# Patient Record
Sex: Female | Born: 1960 | Race: White
Health system: Southern US, Community
[De-identification: ages and names within clinical notes are randomized; demographics above are authoritative.]

## PROBLEM LIST (undated history)

## (undated) DIAGNOSIS — J45909 Unspecified asthma, uncomplicated: Secondary | ICD-10-CM

## (undated) DIAGNOSIS — F419 Anxiety disorder, unspecified: Secondary | ICD-10-CM

## (undated) DIAGNOSIS — R51 Headache: Secondary | ICD-10-CM

## (undated) DIAGNOSIS — E7212 Methylenetetrahydrofolate reductase deficiency: Secondary | ICD-10-CM

## (undated) DIAGNOSIS — E7211 Homocystinuria: Secondary | ICD-10-CM

## (undated) DIAGNOSIS — K562 Volvulus: Secondary | ICD-10-CM

## (undated) DIAGNOSIS — F32A Depression, unspecified: Secondary | ICD-10-CM

## (undated) DIAGNOSIS — G4730 Sleep apnea, unspecified: Secondary | ICD-10-CM

## (undated) DIAGNOSIS — Z411 Encounter for cosmetic surgery: Secondary | ICD-10-CM

## (undated) DIAGNOSIS — M5432 Sciatica, left side: Secondary | ICD-10-CM

## (undated) DIAGNOSIS — K219 Gastro-esophageal reflux disease without esophagitis: Secondary | ICD-10-CM

## (undated) DIAGNOSIS — F17210 Nicotine dependence, cigarettes, uncomplicated: Secondary | ICD-10-CM

## (undated) DIAGNOSIS — M545 Low back pain, unspecified: Secondary | ICD-10-CM

## (undated) DIAGNOSIS — F40240 Claustrophobia: Secondary | ICD-10-CM

## (undated) DIAGNOSIS — M1990 Unspecified osteoarthritis, unspecified site: Secondary | ICD-10-CM

## (undated) DIAGNOSIS — J302 Other seasonal allergic rhinitis: Secondary | ICD-10-CM

## (undated) DIAGNOSIS — K589 Irritable bowel syndrome without diarrhea: Secondary | ICD-10-CM

## (undated) DIAGNOSIS — Z973 Presence of spectacles and contact lenses: Secondary | ICD-10-CM

## (undated) DIAGNOSIS — J069 Acute upper respiratory infection, unspecified: Secondary | ICD-10-CM

## (undated) DIAGNOSIS — K635 Polyp of colon: Secondary | ICD-10-CM

## (undated) DIAGNOSIS — M539 Dorsopathy, unspecified: Secondary | ICD-10-CM

## (undated) DIAGNOSIS — T8859XA Other complications of anesthesia, initial encounter: Secondary | ICD-10-CM

## (undated) DIAGNOSIS — H9319 Tinnitus, unspecified ear: Secondary | ICD-10-CM

## (undated) DIAGNOSIS — F329 Major depressive disorder, single episode, unspecified: Secondary | ICD-10-CM

## (undated) DIAGNOSIS — T4145XA Adverse effect of unspecified anesthetic, initial encounter: Secondary | ICD-10-CM

## (undated) DIAGNOSIS — R6889 Other general symptoms and signs: Secondary | ICD-10-CM

## (undated) HISTORY — DX: Nicotine dependence, cigarettes, uncomplicated: F17.210

## (undated) HISTORY — DX: Sleep apnea, unspecified: G47.30

## (undated) HISTORY — DX: Encounter for cosmetic surgery: Z41.1

## (undated) HISTORY — PX: HX ABDOMINOPLASTY: SHX6

## (undated) HISTORY — PX: HX EYE SURGERY: 2100001143

## (undated) HISTORY — PX: HX SEPTOPLASTY: SHX37

## (undated) HISTORY — PX: HX LAPAROTOMY: SHX154

## (undated) HISTORY — PX: COSMETIC SURGERY: SHX468

## (undated) HISTORY — PX: NOVASURE ABLATION: SHX5394

## (undated) HISTORY — PX: BLADDER SURGERY: SHX569

## (undated) HISTORY — PX: SEPTOPLASTY: SUR1290

---
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
## 2007-10-11 HISTORY — PX: LIPOSUCTION, NECK (COSMETIC): SHX4751

## 2007-10-11 HISTORY — PX: ABDOMINOPLASTY, LIPOSUCTION (COSMETIC): SHX3010

## 2010-03-14 ENCOUNTER — Emergency Department
Admission: EM | Admit: 2010-03-14 | Disposition: A | Discharge status: Home / Self Care | Payer: Self-pay | Source: Emergency Department | Admitting: Emergency Medicine

## 2011-11-04 ENCOUNTER — Emergency Department
Admission: EM | Admit: 2011-11-04 | Disposition: A | Discharge status: Home / Self Care | Payer: Self-pay | Source: Emergency Department | Admitting: Emergency Medicine

## 2013-10-10 DIAGNOSIS — A0472 Enterocolitis due to Clostridium difficile, not specified as recurrent: Secondary | ICD-10-CM

## 2013-10-10 HISTORY — PX: HYSTEROSCOPY, ENDOMETRIAL ABLATION, THERMACHOICE: SHX4233

## 2013-10-10 HISTORY — PX: EGD, COLONOSCOPY: SHX3799

## 2013-10-10 HISTORY — PX: CYSTOSCOPY, URETEROSCOPY: SHX3632

## 2013-10-10 HISTORY — DX: Enterocolitis due to Clostridium difficile, not specified as recurrent: A04.72

## 2013-10-29 ENCOUNTER — Other Ambulatory Visit
Admission: RE | Admit: 2013-10-29 | Discharge: 2013-10-29 | Disposition: A | Discharge status: Home / Self Care | Source: Ambulatory Visit | Attending: Gastroenterology | Admitting: Gastroenterology

## 2013-10-29 ENCOUNTER — Ambulatory Visit: Payer: Self-pay

## 2013-10-29 DIAGNOSIS — K209 Esophagitis, unspecified without bleeding: Secondary | ICD-10-CM

## 2013-10-29 DIAGNOSIS — K319 Disease of stomach and duodenum, unspecified: Secondary | ICD-10-CM

## 2013-10-29 DIAGNOSIS — K219 Gastro-esophageal reflux disease without esophagitis: Secondary | ICD-10-CM | POA: Insufficient documentation

## 2013-10-29 DIAGNOSIS — K2960 Other gastritis without bleeding: Secondary | ICD-10-CM | POA: Insufficient documentation

## 2013-10-29 DIAGNOSIS — R109 Unspecified abdominal pain: Secondary | ICD-10-CM | POA: Insufficient documentation

## 2013-10-29 DIAGNOSIS — K2289 Other specified disease of esophagus: Secondary | ICD-10-CM | POA: Insufficient documentation

## 2013-10-29 HISTORY — PX: HX UPPER ENDOSCOPY: 2100001144

## 2013-10-30 ENCOUNTER — Ambulatory Visit (HOSPITAL_BASED_OUTPATIENT_CLINIC_OR_DEPARTMENT_OTHER)
Admission: RE | Admit: 2013-10-30 | Discharge: 2013-10-30 | Disposition: A | Discharge status: Home / Self Care | Source: Ambulatory Visit | Attending: Gynecology | Admitting: Gynecology

## 2013-10-30 ENCOUNTER — Encounter (HOSPITAL_BASED_OUTPATIENT_CLINIC_OR_DEPARTMENT_OTHER): Payer: Self-pay

## 2013-10-30 ENCOUNTER — Other Ambulatory Visit (HOSPITAL_BASED_OUTPATIENT_CLINIC_OR_DEPARTMENT_OTHER): Payer: Self-pay | Admitting: Gynecology

## 2013-10-30 DIAGNOSIS — N924 Excessive bleeding in the premenopausal period: Secondary | ICD-10-CM | POA: Insufficient documentation

## 2013-10-30 DIAGNOSIS — Z01812 Encounter for preprocedural laboratory examination: Secondary | ICD-10-CM | POA: Insufficient documentation

## 2013-10-30 DIAGNOSIS — N393 Stress incontinence (female) (male): Secondary | ICD-10-CM | POA: Insufficient documentation

## 2013-10-30 DIAGNOSIS — Z01810 Encounter for preprocedural cardiovascular examination: Secondary | ICD-10-CM | POA: Insufficient documentation

## 2013-10-30 HISTORY — DX: Other complications of anesthesia, initial encounter: T88.59XA

## 2013-10-30 HISTORY — DX: Gastro-esophageal reflux disease without esophagitis: K21.9

## 2013-10-30 HISTORY — DX: Anxiety disorder, unspecified: F41.9

## 2013-10-30 HISTORY — DX: Presence of spectacles and contact lenses: Z97.3

## 2013-10-30 HISTORY — DX: Dorsopathy, unspecified: M53.9

## 2013-10-30 HISTORY — DX: Unspecified osteoarthritis, unspecified site: M19.90

## 2013-10-30 HISTORY — DX: Unspecified asthma, uncomplicated: J45.909

## 2013-10-30 HISTORY — DX: Sleep apnea, unspecified: G47.30

## 2013-10-30 HISTORY — DX: Depression, unspecified: F32.A

## 2013-10-30 LAB — LAB USE ONLY - HISTORICAL SURGICAL PATHOLOGY

## 2013-10-30 LAB — URINALYSIS WITH CULTURE REFLEX IF INDICATED BMC/JMC ONLY
BILIRUBIN,URINE: NEGATIVE
BLOOD,URINE: NEGATIVE
GLUCOSE, URINE: NEGATIVE mg/dL
KETONES,URINE: NEGATIVE mg/dL
LEUKOCYTE ESTERASE,URINE: NEGATIVE
NITRITES,URINE: NEGATIVE
PH,URINE: 6.5 (ref 5.0–7.5)
PROTEIN, URINE, RANDOM: NEGATIVE mg/dL
SPECIFIC GRAVITY,URINE: <=1.005 (ref 1.005–1.020)
UROBILINOGEN, URINE: 0.2 mg/dL (ref 0.2–1.0)

## 2013-10-30 LAB — COMPREHENSIVE METABOLIC PROFILE - BMC/JMC ONLY
ALBUMIN: 3.6 g/dL (ref 3.2–5.0)
ALKALINE PHOSPHATASE: 55 IU/L (ref 35–120)
ALT (SGPT): 19 IU/L (ref 0–55)
AST (SGOT): 21 IU/L (ref 0–45)
BILIRUBIN, TOTAL: 0.6 mg/dL (ref 0.0–1.3)
BUN: 7 mg/dL (ref 6–22)
CALCIUM: 9.2 mg/dL (ref 8.5–10.5)
CARBON DIOXIDE: 31 mmol/L (ref 22–32)
CHLORIDE: 104 mmol/L (ref 101–111)
CHLORIDE: 104 mmol/L (ref 101–111)
CREATININE: 0.72 mg/dL (ref 0.53–1.00)
CREATININE: 0.72 mg/dL (ref 0.53–1.00)
ESTIMATED GLOMERULAR FILTRATION RATE: >60 mL/min (ref >60)
GLUCOSE: 76 mg/dL (ref 70–110)
POTASSIUM: 4.3 mmol/L (ref 3.5–5.0)
SODIUM: 137 mmol/L (ref 136–145)
TOTAL PROTEIN: 6.2 g/dL (ref 6.0–8.0)

## 2013-10-30 LAB — CBC
BASOPHIL #: 0.07 K/uL (ref 0.00–0.10)
BASOPHILS %: 0.8 % (ref 0.0–1.4)
EOSINOPHIL %: 2.6 % (ref 0.0–5.2)
HCT: 38.9 % (ref 36.0–45.0)
HGB: 13.6 g/dL (ref 12.0–15.5)
LYMPHOCYTE #: 2.14 K/uL (ref 0.70–3.20)
LYMPHOCYTE %: 24.6 % (ref 15.0–43.0)
MCH: 33.5 pg (ref 28.0–34.0)
MCHC: 34.8 g/dL (ref 33.0–37.0)
MCV: 96.3 fL (ref 82.0–97.0)
MONOCYTE #: 0.56 10*3/uL (ref 0.20–0.90)
MONOCYTE %: 6.4 % (ref 4.8–12.0)
MPV: 6.9 fL — ABNORMAL LOW (ref 7.0–9.4)
PLATELET COUNT: 281 K/uL (ref 150–400)
PMN #: 5.71 K/uL (ref 1.50–6.50)
PMN %: 65.6 % (ref 43.0–76.0)
RBC: 4.04 M/uL (ref 4.00–5.10)
RDW: 12.4 % (ref 11.0–13.0)
WBC: 8.7 K/uL (ref 4.0–11.0)

## 2013-10-30 LAB — MRSA BY PCR (NARES ONLY) - BMC/JMC ONLY: STAPH AUREUS METHICILLIN RESISTANT DNA: NEGATIVE

## 2013-10-30 LAB — TYPE AND SCREEN - BMC/JMC ONLY
ABO/RH(D): O POS
ANTIBODY SCREEN: NEGATIVE

## 2013-11-18 MED ORDER — CEFAZOLIN 1 GRAM/50 ML IN DEXTROSE (ISO-OSMOTIC) INTRAVENOUS DUPLEX
1.00 g | INJECTION | Freq: Once | INTRAVENOUS | Status: DC
Start: 2013-11-18 — End: 2013-11-18
  Administered 2013-11-19: 1 g via INTRAVENOUS
  Filled 2013-11-18: qty 50

## 2013-11-18 MED ORDER — CEFAZOLIN 1 GRAM/50 ML IN DEXTROSE (ISO-OSMOTIC) INTRAVENOUS PIGGYBACK
1.00 g | INJECTION | Freq: Once | INTRAVENOUS | Status: DC
Start: 2013-11-19 — End: 2013-11-19
  Filled 2013-11-18: qty 50

## 2013-11-19 ENCOUNTER — Other Ambulatory Visit (HOSPITAL_COMMUNITY): Payer: Self-pay | Admitting: Gynecology

## 2013-11-19 ENCOUNTER — Inpatient Hospital Stay (HOSPITAL_BASED_OUTPATIENT_CLINIC_OR_DEPARTMENT_OTHER)
Admission: RE | Admit: 2013-11-19 | Discharge: 2013-11-19 | Disposition: A | Discharge status: Home / Self Care | Source: Ambulatory Visit | Attending: Gynecology | Admitting: Gynecology

## 2013-11-19 ENCOUNTER — Encounter (HOSPITAL_BASED_OUTPATIENT_CLINIC_OR_DEPARTMENT_OTHER)
Admission: RE | Disposition: A | Discharge status: Home / Self Care | Payer: Self-pay | Source: Ambulatory Visit | Attending: Gynecology

## 2013-11-19 ENCOUNTER — Encounter (HOSPITAL_BASED_OUTPATIENT_CLINIC_OR_DEPARTMENT_OTHER): Payer: Self-pay

## 2013-11-19 DIAGNOSIS — F3289 Other specified depressive episodes: Secondary | ICD-10-CM | POA: Insufficient documentation

## 2013-11-19 DIAGNOSIS — F411 Generalized anxiety disorder: Secondary | ICD-10-CM | POA: Insufficient documentation

## 2013-11-19 DIAGNOSIS — M19049 Primary osteoarthritis, unspecified hand: Secondary | ICD-10-CM | POA: Insufficient documentation

## 2013-11-19 DIAGNOSIS — F17200 Nicotine dependence, unspecified, uncomplicated: Secondary | ICD-10-CM | POA: Insufficient documentation

## 2013-11-19 DIAGNOSIS — N859 Noninflammatory disorder of uterus, unspecified: Secondary | ICD-10-CM

## 2013-11-19 DIAGNOSIS — F329 Major depressive disorder, single episode, unspecified: Secondary | ICD-10-CM | POA: Insufficient documentation

## 2013-11-19 DIAGNOSIS — K219 Gastro-esophageal reflux disease without esophagitis: Secondary | ICD-10-CM | POA: Insufficient documentation

## 2013-11-19 DIAGNOSIS — N841 Polyp of cervix uteri: Secondary | ICD-10-CM

## 2013-11-19 DIAGNOSIS — N924 Excessive bleeding in the premenopausal period: Secondary | ICD-10-CM | POA: Insufficient documentation

## 2013-11-19 DIAGNOSIS — N3641 Hypermobility of urethra: Secondary | ICD-10-CM | POA: Insufficient documentation

## 2013-11-19 DIAGNOSIS — K449 Diaphragmatic hernia without obstruction or gangrene: Secondary | ICD-10-CM | POA: Insufficient documentation

## 2013-11-19 DIAGNOSIS — N393 Stress incontinence (female) (male): Secondary | ICD-10-CM | POA: Insufficient documentation

## 2013-11-19 DIAGNOSIS — D260 Other benign neoplasm of cervix uteri: Secondary | ICD-10-CM | POA: Insufficient documentation

## 2013-11-19 DIAGNOSIS — M5430 Sciatica, unspecified side: Secondary | ICD-10-CM | POA: Insufficient documentation

## 2013-11-19 DIAGNOSIS — G4730 Sleep apnea, unspecified: Secondary | ICD-10-CM | POA: Insufficient documentation

## 2013-11-19 DIAGNOSIS — M47812 Spondylosis without myelopathy or radiculopathy, cervical region: Secondary | ICD-10-CM | POA: Insufficient documentation

## 2013-11-19 HISTORY — PX: HYSTEROSCOPY W/ ENDOMETRIAL ABLATION: SUR665

## 2013-11-19 HISTORY — PX: INCONTINENCE SURGERY: SHX676

## 2013-11-19 HISTORY — PX: CYSTOSCOPY: SUR368

## 2013-11-19 HISTORY — PX: DILATION AND CURETTAGE, DIAGNOSTIC / THERAPEUTIC: SUR384

## 2013-11-19 LAB — HCG, URINE QUALITATIVE, PREGNANCY: PREGNANCY, URINE: NEGATIVE m[IU]/mL

## 2013-11-19 SURGERY — VAGINAL SLING
Anesthesia: General | Wound class: Clean Contaminated Wounds-The respiratory, GI, Genital, or urinary

## 2013-11-19 MED ORDER — HYDROMORPHONE 1 MG/ML INJECTION WRAPPER
0.50 mg | INJECTION | INTRAMUSCULAR | Status: DC | PRN
Start: 2013-11-19 — End: 2013-11-19

## 2013-11-19 MED ORDER — IPRATROPIUM BROMIDE 0.02 % SOLUTION FOR INHALATION
0.50 mg | Freq: Once | RESPIRATORY_TRACT | Status: DC | PRN
Start: 2013-11-19 — End: 2013-11-19

## 2013-11-19 MED ORDER — BUPIVACAINE-EPINEPHRINE (PF) 0.25 %-1:200,000 INJECTION SOLUTION
INTRAMUSCULAR | Status: AC
Start: 2013-11-19 — End: 2013-11-19
  Filled 2013-11-19: qty 1

## 2013-11-19 MED ORDER — SULFAMETHOXAZOLE 800 MG-TRIMETHOPRIM 160 MG TABLET
1.00 | ORAL_TABLET | Freq: Two times a day (BID) | ORAL | Status: DC
Start: 2013-11-19 — End: 2018-08-28

## 2013-11-19 MED ORDER — SODIUM CHLORIDE 0.9 % IRRIGATION SOLUTION
Freq: Once | Status: AC
Start: 2013-11-19 — End: 2013-11-19

## 2013-11-19 MED ORDER — ALBUTEROL SULFATE CONCENTRATE 2.5 MG/0.5 ML SOLUTION FOR NEBULIZATION
5.00 mg | INHALATION_SOLUTION | Freq: Once | RESPIRATORY_TRACT | Status: DC | PRN
Start: 2013-11-19 — End: 2013-11-19

## 2013-11-19 MED ORDER — HYDROCODONE 5 MG-ACETAMINOPHEN 325 MG TABLET
1.00 | ORAL_TABLET | Freq: Once | ORAL | Status: AC | PRN
Start: 2013-11-19 — End: 2013-11-19
  Administered 2013-11-19: 1 via ORAL
  Filled 2013-11-19: qty 1

## 2013-11-19 MED ORDER — CEFAZOLIN 1 GRAM SOLUTION FOR INJECTION
INTRAMUSCULAR | Status: AC
Start: 2013-11-19 — End: 2013-11-19
  Filled 2013-11-19: qty 10

## 2013-11-19 MED ORDER — BUPIVACAINE-EPINEPHRINE (PF) 0.25 %-1:200,000 INJECTION SOLUTION
10.00 mL | Freq: Once | INTRAMUSCULAR | Status: AC
Start: 2013-11-19 — End: 2013-11-19
  Administered 2013-11-19: 10 mL via INTRAMUSCULAR

## 2013-11-19 MED ORDER — LACTATED RINGERS INTRAVENOUS SOLUTION
INTRAVENOUS | Status: DC
Start: 2013-11-19 — End: 2013-11-19

## 2013-11-19 MED ORDER — PROMETHAZINE 12.5MG IN NS 50ML PREMIX
12.50 mg | INJECTION | Freq: Once | INTRAVENOUS | Status: DC | PRN
Start: 2013-11-19 — End: 2013-11-19

## 2013-11-19 MED ORDER — KETOROLAC 30 MG/ML (1 ML) INJECTION SOLUTION
30.00 mg | Freq: Once | INTRAMUSCULAR | Status: DC | PRN
Start: 2013-11-19 — End: 2013-11-19

## 2013-11-19 MED ORDER — DEXAMETHASONE SODIUM PHOSPHATE 10 MG/ML INJECTION SOLUTION
INTRAMUSCULAR | Status: AC
Start: 2013-11-19 — End: 2013-11-19
  Filled 2013-11-19: qty 1

## 2013-11-19 MED ORDER — ACETAMINOPHEN 1,000 MG/100 ML (10 MG/ML) INTRAVENOUS SOLUTION
INTRAVENOUS | Status: AC
Start: 2013-11-19 — End: 2013-11-19
  Filled 2013-11-19: qty 100

## 2013-11-19 MED ORDER — HYDROCODONE 5 MG-ACETAMINOPHEN 325 MG TABLET
1.00 | ORAL_TABLET | ORAL | Status: DC | PRN
Start: 2013-11-19 — End: 2018-08-28

## 2013-11-19 MED ADMIN — Medication: @ 14:00:00

## 2013-11-19 MED ADMIN — lactated Ringers intravenous solution: INTRAVENOUS | @ 13:00:00

## 2013-11-19 SURGICAL SUPPLY — 38 items
CATH IU THRMCH BAL ABLT UT EXC ESSIVE MENSTRUAL BLD (BALLOON) IMPLANT
CATH URETH 16FR FOLEY 2W BAL UNCT SIL 5CC STRL LF  DISP (UROLOGICAL SUPPLIES) ×2 IMPLANT
CATH URETH BARD 16FR FL 2W BAL_UNCT SIL 5CC STRL LF DISP (UROLOGICAL SUPPLIES) ×1
CATH URETH DOVER RBNL 16FR 16IN 2 STGR DRAIN EYE RND CLS TIP INT FNL CONN PVC STRL LF  DISP (UROLOGICAL SUPPLIES) ×2 IMPLANT
CATH URETH DV RBNL 16FR 16IN 2_STGR DRAIN EYE RND CLS TIP (UROLOGICAL SUPPLIES) ×1
CONV USE ITEM 338068 - KIT SURG SM STUP NONST DISP LF (CUSTOM TRAYS & PACK) ×2 IMPLANT
DRAPE PCH PORT 26X18IN UNDR BUTT SURG CLR (PROTECTIVE PRODUCTS/GARMENTS) ×2
DRAPE PCH PORT 26X18IN UNDR BU_TT SURG CLR (PROTECTIVE PRODUCTS/GARMENTS) ×1
DRAPE PCH PORT 39X46IN UNDR BUTT SURG CLR (PROTECTIVE PRODUCTS/GARMENTS) ×2 IMPLANT
DRESSING TELFA PAD 3X4IN ST 2132 100EA/BX (WOUND CARE SUPPLY) ×2 IMPLANT
DRESSING TELFA PAD 3X4IN ST 2132 100EA/BX (WOUND CARE/ENTEROSTOMAL SUPPLY) ×1
GLOVE SURG 7 LTX 3 PLN MLD PWD R BEAD CUF ANSLP STRL BRN 12IN (GLOVES AND ACCESSORIES) ×3 IMPLANT
GOWN SURG XL AAMI L4 IMPRV REI NF BRTHBL STRL LF DISP CNVRT (PROTECTIVE PRODUCTS/GARMENTS) ×3 IMPLANT
KIT SURG SM STUP NONST DISP LF (CUSTOM TRAYS & PACK) ×2
KIT UTER SURESOUND ENDMTRL ABLATION (BFP) ×2 IMPLANT
KIT UTER SURESOUND ENDMTRL ABL_ATION (BFP) ×2
NEEDLE HYPO  25GA 1.5IN MONOJECT MAGELLAN SS BVL ORT SLF LEVEL SHEATH SFSHLD STD LL SYRG ORNG STRL (Syringes w/ o Needles) ×2 IMPLANT
NEEDLE HYPO 25GA 1.5IN MONOJE CT MGLN SS BVL ORT SLF LEVEL (Syringes w/ o Needles) ×1
PACK GYN/PERI 9165 9165 (DRAPE/PACKS/SHEETS/OR TOWEL) ×3 IMPLANT
PACK SM SET UP_DYNJ906635 (CUSTOM TRAYS & PACK) ×1
PAD RELEASE 3 X 4 IN_1050 50/BX (WOUND CARE SUPPLY) ×2 IMPLANT
PAD RELEASE 3 X 4 IN_1050 50/BX (WOUND CARE/ENTEROSTOMAL SUPPLY) ×1
SET 81IN REG CLAMP N-PYRG IRRG 10 GTT/ML STR CSCP DEHP BLADDER STRL LF (IV TUBING & ACCESSORIES) ×2 IMPLANT
SET 81IN REG CLAMP N-PYRG IRRG_10 GTT/ML STR CSCP DEHP (IV TUBING & ACCESSORIES) ×1
SOL IRRG 0.9% NACL 500ML PLASTIC PR BTL ISTNC N-PYRG STRL LF (SOLUTIONS) ×2 IMPLANT
SOL IV 0.9% NACL PVC 1000ML PH 5 PLASTIC CONTAINR PRSV FR VIAFLEX LF (SOLUTIONS) ×2 IMPLANT
SOLUTION IRRG NS 500CC 2F7123_18/CS (SOLUTIONS) ×2
SOLUTION IV NS 1000CC 2B1324X_14/CS (SOLUTIONS) ×1
SUTURE 3-0 SH VICRYL 27IN VIOL BRD COAT ABS (SUTURE/WOUND CLOSURE) ×2 IMPLANT
SUTURE 3-0 SH VICRYL 27IN VIOL_BRD COAT ABS (SUTURE/WOUND CLOSURE) ×1
SYRINGE HYPO 10CC LL 309604 100/BX (Syringes w/ o Needles) ×3 IMPLANT
TIP SUCT MEDIVAC YANKAUER TAPER BLBS CLR STRL LF  DISP (Suction) ×2 IMPLANT
TRAY SKIN SCRUB 8IN VNYL COTTON 6 WNG 6 SPONGE STICK 2 TIP APPL DRY STRL LF (KITS & TRAYS (DISPOSABLE)) ×2 IMPLANT
TRAY SURG PREP SCR CR ESTM (KITS & TRAYS (DISPOSABLE)) ×2
TUBING FLUID SAFE 10/BX_0502200000A (Suction) ×1
TUBING IRRG INTGR FLUID MGMT DISP (Suction) ×2 IMPLANT
URETH SUP SOLYX ADV 9CM 1 INCS SLING 1 DEL DEV 1 MESH POLYPROP SYSTEM MIDURETHRAL ×2 IMPLANT
YANKAUER SUCT TIP K80 CS/50 (Suction) ×1

## 2013-11-19 NOTE — Nurses Notes (Signed)
Patient discharged home with family.  AVS reviewed with patient/care giver.  A written copy of the AVS and discharge instructions was given to the patient/care giver.  Questions sufficiently answered as needed.  Patient/care giver encouraged to follow up with PCP as indicated.  In the event of an emergency, patient/care giver instructed to call 911 or go to the nearest emergency room.

## 2013-11-19 NOTE — OR Surgeon (Signed)
[facility]  [place], [place] [address]                                  OPERATIVE NOTE    Patient Name: [facility] Number: [id number]  Date of Service: 11/19/2013   Date of Birth: 01/12/1961      Pre-Operative Diagnosis: STRESS INCONTINENCE, PREMENOPAUSAL MENORRHAGIA, URETHRAL HYPERMOBILITY    Post-Operative Diagnosis: STRESS INCONTINENCE, PREMENOPAUSAL MENORRHAGIA, URETHRAL HYPERMOBILITY, CERVICAL POLYP    Procedure:    1. Diagnostic Hysteroscopy  2. Dilatation and curettage  3. Novasure Endometrial Ablation  4. MiniArc sling  5. Cystoscopy    Complications:  None     Surgeon: Estrella Myrtle, MD    Anesthesia: General    Estimated Blood Loss:  50    Specimen: None    Findings:   Uterine cavity length: 6.0 cm  Uterine cavity width 3.6 cm  Power: 119 watts  Time of therapy: 70 seconds  EUA revealed approx. 1X 2 cm cervical polyp protruding from os (removed with polyp forceps).  Hysteroscopic findings revealed a normal cavity. Post-ablation hysteroscopy revealed no sign of perforation and good global ablative changes. Cystoscopic findings after sling revealed neither damage to nor mesh within the bladder or urethra.               Condition: STABLE    Disposition: PACU          Time out performed prior the start of this procedure.    DESCRIPTION OF PROCEDURE: The patient was taken to the operating room, given general anesthesia, prepped and draped in the usual sterile fashion, placed in the dorsal lithotomy position with legs in adjustable stirrups. The bladder was drained with a catheter. A speculum was placed in the vagina. The anterior lip of the cervix was grasped with a single tooth tenaculum, retracted downward. The uterus was then sounded. The cervix was gently progressively dilated to 16 [place] using smooth metal dilators. The diagnostic hysteroscope was introduced with crystalloid as medium. The cavity was assessed with findings as noted above. The NovaSure  device was adjusted as appropriate. It was advanced to the fundus, the array extended and the maximum uterine width was obtained using the usual maneuvers. The cavity assessment test was performed and successfully completed on one attempt without incident. The therapy cycle was then begun and progressied normally through its automatic course without incident. The array was again retracted and the device removed. Hysteroscopy was repeated with findings as noted above. The tenaculum was removed. A small amount of bleeding was noted from the right tenaculum site.  This area was packed with 2 ray-tecs which were left in place during the sling portion of the procedure.      The MiniArc sling procedure was then performed as follows:  A Foley catheter was in place. The vaginal epithelium in the midline beneath the mid urethral region was grasped in vertical fashion using two Allis clamps, infiltrated using quarter percent Marcaine with epinephrine and incised using a scalpel. The vaginal epithelium was then dissected free bilaterally using the Metzenbaum scissors and the operator's finger for gentle dissection until the inferior pubic ramus and inner surface of the obturator membrane could be palpated on either side. The MiniArc sling was then attached to its needle trocar passer and the left anchoring point was advanced into the vaginal incision being guided by the operator's finger to a point  where it was used to pierce the musculofascial tissue along the inner surface of the left obturator membrane at its upper inner aspect at a 45-degree angle. It was advanced until the blue midline of the mesh tape could be seen at the midline beneath the urethra. The same procedure was then performed on the opposite side. The sling was then inspected and palpated and noted to be in good position beneath the mid urethra snug against the urethra without tension. The vaginal epithelium was closed using a running 3-0 Vicryl suture.  Antibiotic irrigation was used copiously throughout the case. The Foley catheter was removed. The cystoscopy was then performed with findings as noted above. The ray tec packings were removed and good hemostasis was noted.    The patient tolerated the procedure well and was taken to the recovery room awake and in stable condition, sponge/lap/needle counts correct X 2.     Zandra Abts, Md

## 2013-11-19 NOTE — Consults (Signed)
[facility]  [place], [place]  [address]    ANESTHESIA PRE-OP EVALUATION    MRN:  [id number]  Julie Browning  53 y.o.  Sex: female     Date of Service: 11/19/2013    Surgeon: Juliann Mule):  Estrella Myrtle, MD    Scheduled Procedure:  Procedure(s):  VAGINAL SLING Merna  HYSTEROSCOPY WITH NOVASURE  HYSTEROSCOPY WITH DILATION AND CURETTAGE  CYSTOSCOPY    Diagnosis/Pertinant HPI: STRESS INCONTINENCE, PREMENOPAUSAL MENORRHAGIA, URETHRAL HYPERMOBILITY     Weight: 78.019 kg (172 lb)  Height: 167.6 cm (5' 5.98")  Pain Score (Numeric, Faces): 4  Filed Vitals:    11/19/13 1229   BP: 113/70   Pulse: 79   Temp: 36.7 C (98.1 F)   Resp: 14   SpO2: 100%            ALLERGY:  No Known Allergies    Medications Prior to Admission    Outpatient Medications    Bifidobacterium Infantis (ALIGN) 4 mg Oral Capsule    Take by mouth    clonazePAM (KLONOPIN) 0.5 mg Oral Tablet    Take 0.5 mg by mouth Once per day as needed for Other (anxiety)    DULoxetine (CYMBALTA DR) 60 mg Oral Capsule, Delayed Release(E.C.)    Take 90 mg by mouth Once a day    esomeprazole magnesium (NEXIUM) 40 mg Oral Capsule, Delayed Release(E.C.)    Take 40 mg by mouth Every morning before breakfast    metroNIDAZOLE (FLAGYL) 500 mg Oral Tablet    Take 500 mg by mouth Three times a day      No meds today    Current Facility-Administered Medications:  LR premix infusion  Intravenous Continuous       Past Medical History   Diagnosis Date    Hiatal hernia     GERD (gastroesophageal reflux disease)     Anesthesia complication      hypotension with epidural; crying s/p abdominoplasty    Asthma      teenager; most recent occurance age 81    Sleep apnea      uses BiPAP occasionally    Wears glasses     Depression     Anxiety     Back problem      sciatica    Arthritis      neck and hands    C. difficile diarrhea 10/2013     taking flagyl to treat    Positive PPD      tests positive, but negative chest xrays   Able to lay flat  NPO    Past Surgical History   Procedure Laterality Date    Hx eye surgery Left      s/p injury    Hx laparotomy       ectopic pregnancy; removal of fallopian tube    Hx septoplasty      Hx abdominoplasty       also a necklift    Hx upper endoscopy  10/29/13     x 3       Pregnant: no    Prior Anesthesia Difficulties:  no    Familial Anesthesia Difficulties: no    Social History     Occupational History    Not on file.     Social History Main Topics    Smoking status: Current Every Day Smoker -- 0.75 packs/day     Types: Cigarettes    Smokeless tobacco: Never Used      Comment: using  vapor cigarette as well    Alcohol Use: Yes      Comment: occassional    Drug Use: No    Sexual Activity: Not on file     Other Topics Concern    Ability To Walk 2 Flight Of Steps Without Sob/Cp Yes    Ability To Do Own Adl's Yes       Labs:   BMP:    Lab Results   Component Value Date    SODIUM 137 10/30/2013    POTASSIUM 4.3 10/30/2013    CHLORIDE 104 10/30/2013    CO2 31 10/30/2013    BUN 7 10/30/2013    CREATININE 0.72 10/30/2013    GLUCOSECJ 76 10/30/2013    GFR >60 10/30/2013    CALCIUM 9.2 10/30/2013     CBC:    Lab Results   Component Value Date    WBC 8.7 10/30/2013    HGB 13.6 10/30/2013    HCT 38.9 10/30/2013    PLTCNT 281 10/30/2013      Platelets:    Lab Results   Component Value Date    PLTCNT 281 10/30/2013     Coags:    No results found for this basename: prothromtme, inr, inrnorm, aptt, ddimerquant, fibrinogen     TSH:   No results found for this basename: TSHCAS, TSH3RDGEN, FT3, freet4     Pregnancy test: Lab Results   Component Value Date    PREGNANCURED NEGATIVE 11/19/2013      Fingerstick glucose:    No results found for this basename: POCGLU, GLUPOCT             EKG: NSR  CXR:  N/A      ANESTHESIA DAY OF SURGERY EVALUATION      NPO: After midnight  Airway: II (soft palate, uvula, fauces visible)  Dentition:  Upper and lower  Normal    Cardiovascular: regular rate and rhythm   Respiratory: clear to auscultation  bilaterally       ASA Status: 2  Proposed Anesthesia: GETA rarely uses BiPAP       Pre-Induction Evaluation and informed consent including risks, benefits, and alternatives. Patient was seen and evaluated immediately prior to the induction of anesthesia.

## 2013-11-19 NOTE — H&P (Signed)
[facility]                                  [place], [place] [address]                                     [phone]                     SURGICAL HISTORY AND PHYSICAL    PATIENT NAME: Julie Browning, Julie Browning:Julie Browning  DATE OF SERVICE:11/19/2013  DATE OF BIRTH: 1961/02/14    REASON FOR SURGERY:  Menorrhagia and stress urinary incontinence.    HISTORY OF PRESENT ILLNESS:  The patient is a 53 year old white female, gravida 5, para 2, who has had ongoing leakage of urine with cough, sneeze and laugh.  She has had confirmatory preoperative urodynamic testing.  She also has had heavy bleeding issues intermittently most recently.  She had an ultrasound performed for which revealed a normal anteverted uterus with a 14-mm endometrium.  She presents for definitive surgical management as below.    PAST OB/GYN HISTORY:  The patient has had a history of cryotherapy in the past.  Normal Pap smears the past several years.  She is gravida 5, para 2, status post normal spontaneous vaginal delivery x2, history of 1 ectopic.    PAST MEDICAL HISTORY:  Depression, gastroesophageal reflux.    PAST SURGICAL HISTORY:  Upper and lower endoscopy, removal of "excess skin" from the abdomen, deviated septum, exploratory laparotomy with right salpingo-oophorectomy for a ruptured ectopic.    ALLERGIES:  None known.    SOCIAL HISTORY:  The patient is married, smokes one-half pack of cigarettes per day.  She has no alcohol issues and is sexually active.    FAMILY HISTORY:  Positive for pituitary benign tumor, bone cancer and coronary artery disease.    REVIEW OF SYSTEMS:  No fever, chills, nausea, vomiting, melena, dysuria, hematuria, chest pain, shortness of breath, headaches, paresthesias, weakness, visual or hearing changes.    PHYSICAL EXAMINATION:  HEENT:  Clear.  LUNGS:  Clear to auscultation.  No retractions.  CARDIOVASCULAR:  Regular  rate and rhythm.  2+ pulses x 4.  ABDOMEN:  Soft, nontender, nondistended with normoactive bowel sounds.  No masses or hepatosplenomegaly.  MUSCULOSKELETAL:  Full range of motion, all joints.  Nontender.  NEUROLOGIC:  Cranial nerves II-XII grossly intact.  No motor or sensory deficit.    IMPRESSION:  1.  Stress urinary incontinence.  2.  Urethral hypermobility.  3.  Premenopausal menorrhagia.    PLAN:  We will proceed with hysteroscopy, dilatation and curettage as well as NovaSure endometrial ablation and MiniArc pubovaginal sling with cystoscopy.  Informed consent was obtained.  Please see preoperative notes and office consent form for details.      Julie Clan, MD      [id number]; D: 11/18/2013 15:35:07; T: 11/18/2013 16:14:19

## 2013-11-19 NOTE — Discharge Instructions (Signed)
[facility]  [place], [place] [address]     Anesthesia Discharge Instructions    1.  Do NOT drive an automobile today.    2.  Do NOT sign any legal documents for 24 hours.    3.  Do NOT operate any electrical equipment today.    Fall risk prevention education has been reviewed with the patient/significant other.

## 2013-11-19 NOTE — Consults (Signed)
[place]  ANESTHESIA POSTOP EVALUATION NOTE    11/19/2013    Temperature: 36.7 C (98.1 F) (11/19/13 1229)  Heart Rate: 79 (11/19/13 1229)  BP (Non-Invasive): 113/70 mmHg (11/19/13 1229)  Respiratory Rate: 14 (11/19/13 1229)  SpO2-1: 100 % (11/19/13 1229)  Pain Score (Numeric, Faces): 4 (11/19/13 1230)    Patient should be sufficiently recovered from the effects of anesthesia to partcipate in the evaluation or has returned to their pre-procedure level.    I have reviewed and evaluated the following:  Respiratory Function:  Consistent with pre anesthetic level  Cardiovascular Function:  Consistent with pre anesthetic level  Mental Status:  Return to pre anesthetic baseline level  Pain:  Sufficiently controlled with medication  Nausea and Vomiting:  Absent or sufficiently controlled with medication    Post operative complications: None  Comment/ re-evaluation for any variations:  None      Azucena Freed 11/19/2013, 14:44

## 2013-11-22 LAB — HISTORICAL SURGICAL PATHOLOGY SPECIMEN

## 2013-12-19 ENCOUNTER — Other Ambulatory Visit
Admission: RE | Admit: 2013-12-19 | Discharge: 2013-12-19 | Disposition: A | Discharge status: Home / Self Care | Source: Ambulatory Visit | Attending: Gastroenterology | Admitting: Gastroenterology

## 2013-12-19 DIAGNOSIS — K5730 Diverticulosis of large intestine without perforation or abscess without bleeding: Secondary | ICD-10-CM | POA: Insufficient documentation

## 2013-12-19 DIAGNOSIS — D128 Benign neoplasm of rectum: Secondary | ICD-10-CM | POA: Insufficient documentation

## 2013-12-19 DIAGNOSIS — D129 Benign neoplasm of anus and anal canal: Secondary | ICD-10-CM | POA: Insufficient documentation

## 2013-12-19 DIAGNOSIS — Z1211 Encounter for screening for malignant neoplasm of colon: Secondary | ICD-10-CM | POA: Insufficient documentation

## 2013-12-19 DIAGNOSIS — Z139 Encounter for screening, unspecified: Secondary | ICD-10-CM

## 2013-12-20 LAB — LAB USE ONLY - HISTORICAL SURGICAL PATHOLOGY

## 2014-02-03 ENCOUNTER — Telehealth

## 2014-02-03 NOTE — Pre-Procedure Instructions (Signed)
02/03/14 Attempted interview X 3 message left to reschedule. Requested labs and notes from Dr. Sueanne Margarita office by fax.

## 2014-02-04 NOTE — Pre-Procedure Instructions (Signed)
Needs card LOV note faxed request.  EKG in epic is a 12 lead from the EST done 12/10/13, needs conf, handed to Nav 1 for anesth review.

## 2014-02-04 NOTE — Pre-Procedure Instructions (Signed)
02/04/14 Preoperative labs,EKG,Stress Test and notes in Epic.

## 2014-02-04 NOTE — Pre-Procedure Instructions (Signed)
T/C from [place] at surgeon office - She states she has faxed all the pre ops to [phone].

## 2014-02-05 NOTE — Pre-Procedure Instructions (Addendum)
Call to card office  Pt was there only for  Stress test and no 12 lead  ekg was done, No office visit note either.- only 12 lead is tracing from stress and that is not confirmed. Info given to Nav 1 for anes rev

## 2014-02-05 NOTE — Anesthesia Preprocedure Evaluation (Addendum)
Anesthesia Evaluation    AIRWAY    Mallampati: II    TM distance: >3 FB  Neck ROM: full  Mouth Opening:full   CARDIOVASCULAR    cardiovascular exam normal       DENTAL    No notable dental hx     PULMONARY    pulmonary exam normal     OTHER FINDINGS              PSS Anesthesia Comments: 02/05/14 - EC - Negative Stress test, EF 74%, no ischemia at max HR.  Cleared to proceed from cardiac standpoint.        Anesthesia Plan    ASA 2     general                     intravenous induction   Detailed anesthesia plan: general endotracheal        Post op pain management: per surgeon    informed consent obtained    ECG reviewed  pertinent labs reviewed

## 2014-02-05 NOTE — Pre-Procedure Instructions (Signed)
Sent to anesthesia for review/cleared by Dr. Alvino Chapel

## 2014-02-06 ENCOUNTER — Encounter: Payer: Self-pay | Admitting: Anesthesiology

## 2014-02-06 ENCOUNTER — Ambulatory Visit: Payer: Self-pay | Admitting: Specialist

## 2014-02-06 ENCOUNTER — Encounter: Payer: Self-pay | Admitting: Specialist

## 2014-02-06 ENCOUNTER — Encounter
Admission: RE | Disposition: A | Discharge status: Home / Self Care | Payer: Self-pay | Source: Ambulatory Visit | Attending: Specialist

## 2014-02-06 ENCOUNTER — Ambulatory Visit
Admission: RE | Admit: 2014-02-06 | Discharge: 2014-02-06 | Disposition: A | Discharge status: Home / Self Care | Payer: Self-pay | Source: Ambulatory Visit | Attending: Specialist | Admitting: Specialist

## 2014-02-06 ENCOUNTER — Ambulatory Visit: Payer: Self-pay | Admitting: Anesthesiology

## 2014-02-06 DIAGNOSIS — Z9889 Other specified postprocedural states: Secondary | ICD-10-CM

## 2014-02-06 DIAGNOSIS — K589 Irritable bowel syndrome without diarrhea: Secondary | ICD-10-CM | POA: Insufficient documentation

## 2014-02-06 DIAGNOSIS — J309 Allergic rhinitis, unspecified: Secondary | ICD-10-CM | POA: Insufficient documentation

## 2014-02-06 DIAGNOSIS — L918 Other hypertrophic disorders of the skin: Secondary | ICD-10-CM | POA: Insufficient documentation

## 2014-02-06 DIAGNOSIS — K219 Gastro-esophageal reflux disease without esophagitis: Secondary | ICD-10-CM | POA: Insufficient documentation

## 2014-02-06 DIAGNOSIS — L908 Other atrophic disorders of skin: Secondary | ICD-10-CM | POA: Insufficient documentation

## 2014-02-06 DIAGNOSIS — J45909 Unspecified asthma, uncomplicated: Secondary | ICD-10-CM | POA: Insufficient documentation

## 2014-02-06 DIAGNOSIS — Z418 Encounter for other procedures for purposes other than remedying health state: Secondary | ICD-10-CM | POA: Insufficient documentation

## 2014-02-06 DIAGNOSIS — G4730 Sleep apnea, unspecified: Secondary | ICD-10-CM | POA: Insufficient documentation

## 2014-02-06 DIAGNOSIS — Z87891 Personal history of nicotine dependence: Secondary | ICD-10-CM | POA: Insufficient documentation

## 2014-02-06 HISTORY — DX: Sciatica, left side: M54.32

## 2014-02-06 HISTORY — DX: Headache: R51

## 2014-02-06 HISTORY — PX: FACELIFT, (RHYTIDECTOMY) (COSMETIC): SHX4093

## 2014-02-06 HISTORY — DX: Gastro-esophageal reflux disease without esophagitis: K21.9

## 2014-02-06 HISTORY — DX: Claustrophobia: F40.240

## 2014-02-06 HISTORY — DX: Low back pain, unspecified: M54.50

## 2014-02-06 HISTORY — DX: Depression, unspecified: F32.A

## 2014-02-06 HISTORY — DX: Irritable bowel syndrome, unspecified: K58.9

## 2014-02-06 HISTORY — DX: Unspecified asthma, uncomplicated: J45.909

## 2014-02-06 HISTORY — DX: Other seasonal allergic rhinitis: J30.2

## 2014-02-06 HISTORY — DX: Unspecified osteoarthritis, unspecified site: M19.90

## 2014-02-06 LAB — POCT PREGNANCY TEST, URINE HCG: POCT Pregnancy HCG Test, UR: NEGATIVE

## 2014-02-06 SURGERY — FACELIFT, (RHYTIDECTOMY) (COSMETIC)
Anesthesia: Anesthesia General | Site: Face | Laterality: Bilateral | Wound class: Clean

## 2014-02-06 MED ORDER — DEXAMETHASONE SODIUM PHOSPHATE 4 MG/ML IJ SOLN (WRAP)
INTRAMUSCULAR | Status: DC | PRN
Start: 2014-02-06 — End: 2014-02-06
  Administered 2014-02-06: 12 mg via INTRAVENOUS

## 2014-02-06 MED ORDER — FENTANYL CITRATE 0.05 MG/ML IJ SOLN
50.0000 ug | INTRAMUSCULAR | Status: DC | PRN
Start: 2014-02-06 — End: 2014-02-06

## 2014-02-06 MED ORDER — FENTANYL CITRATE 0.05 MG/ML IJ SOLN
INTRAMUSCULAR | Status: DC | PRN
Start: 2014-02-06 — End: 2014-02-06
  Administered 2014-02-06: 100 ug via INTRAVENOUS

## 2014-02-06 MED ORDER — EPHEDRINE SULFATE 50 MG/ML IJ SOLN
INTRAMUSCULAR | Status: AC
Start: 2014-02-06 — End: ?
  Filled 2014-02-06: qty 1

## 2014-02-06 MED ORDER — OXYCODONE HCL 5 MG PO TABS
ORAL_TABLET | ORAL | Status: AC
Start: 2014-02-06 — End: 2014-02-06
  Administered 2014-02-06: 5 mg
  Filled 2014-02-06: qty 1

## 2014-02-06 MED ORDER — NEOSTIGMINE METHYLSULFATE 1 MG/ML IJ SOLN
INTRAMUSCULAR | Status: AC
Start: 2014-02-06 — End: ?
  Filled 2014-02-06: qty 10

## 2014-02-06 MED ORDER — ALBUTEROL SULFATE HFA 108 (90 BASE) MCG/ACT IN AERS
INHALATION_SPRAY | RESPIRATORY_TRACT | Status: AC
Start: 2014-02-06 — End: ?
  Filled 2014-02-06: qty 1

## 2014-02-06 MED ORDER — EPHEDRINE SULFATE 50 MG/ML IJ SOLN
INTRAMUSCULAR | Status: DC | PRN
Start: 2014-02-06 — End: 2014-02-06
  Administered 2014-02-06 (×2): 15 mg via INTRAVENOUS

## 2014-02-06 MED ORDER — FAMOTIDINE 10 MG/ML IV SOLN (WRAP)
INTRAVENOUS | Status: DC | PRN
Start: 2014-02-06 — End: 2014-02-06
  Administered 2014-02-06: 20 mg via INTRAVENOUS

## 2014-02-06 MED ORDER — ONDANSETRON HCL 4 MG/2ML IJ SOLN
INTRAMUSCULAR | Status: DC | PRN
Start: 2014-02-06 — End: 2014-02-06
  Administered 2014-02-06: 4 mg via INTRAVENOUS

## 2014-02-06 MED ORDER — ONDANSETRON HCL 4 MG/2ML IJ SOLN
INTRAMUSCULAR | Status: DC
Start: 2014-02-06 — End: 2014-02-06
  Filled 2014-02-06: qty 2

## 2014-02-06 MED ORDER — FENTANYL CITRATE 0.05 MG/ML IJ SOLN
INTRAMUSCULAR | Status: AC
Start: 2014-02-06 — End: ?
  Filled 2014-02-06: qty 2

## 2014-02-06 MED ORDER — NEOSTIGMINE METHYLSULFATE 1 MG/ML IJ SOLN
INTRAMUSCULAR | Status: DC | PRN
Start: 2014-02-06 — End: 2014-02-06
  Administered 2014-02-06: 3 mg via INTRAVENOUS

## 2014-02-06 MED ORDER — PHENYLEPHRINE 100 MCG/ML IV BOLUS (ANESTHESIA)
PREFILLED_SYRINGE | INTRAVENOUS | Status: AC
Start: 2014-02-06 — End: ?
  Filled 2014-02-06: qty 5

## 2014-02-06 MED ORDER — MORPHINE SULFATE 2 MG/ML IJ/IV SOLN (WRAP)
Status: AC
Start: 2014-02-06 — End: ?
  Filled 2014-02-06: qty 1

## 2014-02-06 MED ORDER — BUPIVACAINE HCL 0.25 % IJ SOLN
INTRAMUSCULAR | Status: DC | PRN
Start: 2014-02-06 — End: 2014-02-06
  Administered 2014-02-06: 30 mL

## 2014-02-06 MED ORDER — SODIUM CHLORIDE 0.9 % IV MBP
1.0000 g | Freq: Three times a day (TID) | INTRAVENOUS | Status: DC
Start: 2014-02-06 — End: 2016-01-02
  Administered 2014-02-06: 1 g via INTRAVENOUS

## 2014-02-06 MED ORDER — BACITRACIN 500 UNIT/GM EX OINT
TOPICAL_OINTMENT | CUTANEOUS | Status: DC | PRN
Start: 2014-02-06 — End: 2014-02-06
  Administered 2014-02-06: 28.35 g via TOPICAL

## 2014-02-06 MED ORDER — LACTATED RINGERS IV SOLN
INTRAVENOUS | Status: DC
Start: 2014-02-06 — End: 2014-02-06
  Administered 2014-02-06: 1000 mL via INTRAVENOUS

## 2014-02-06 MED ORDER — PROMETHAZINE HCL 25 MG/ML IJ SOLN
6.2500 mg | Freq: Once | INTRAMUSCULAR | Status: DC | PRN
Start: 2014-02-06 — End: 2014-02-06

## 2014-02-06 MED ORDER — OXYCODONE-ACETAMINOPHEN 5-325 MG PO TABS
1.0000 | ORAL_TABLET | ORAL | Status: DC | PRN
Start: 2014-02-06 — End: 2016-01-02

## 2014-02-06 MED ORDER — OXYCODONE HCL 5 MG PO TABS
5.0000 mg | ORAL_TABLET | Freq: Four times a day (QID) | ORAL | Status: DC | PRN
Start: 2014-02-06 — End: 2014-02-06

## 2014-02-06 MED ORDER — SODIUM CHLORIDE 0.9 % IR SOLN
Status: DC | PRN
Start: 2014-02-06 — End: 2014-02-06
  Administered 2014-02-06: 2000 mL

## 2014-02-06 MED ORDER — PROPOFOL 10 MG/ML IV EMUL
INTRAVENOUS | Status: AC
Start: 2014-02-06 — End: ?
  Filled 2014-02-06: qty 40

## 2014-02-06 MED ORDER — HYDROMORPHONE HCL PF 1 MG/ML IJ SOLN
0.5000 mg | INTRAMUSCULAR | Status: DC | PRN
Start: 2014-02-06 — End: 2014-02-06

## 2014-02-06 MED ORDER — LIDOCAINE-EPINEPHRINE 1 %-1:100000 IJ SOLN
INTRAMUSCULAR | Status: DC | PRN
Start: 2014-02-06 — End: 2014-02-06
  Administered 2014-02-06: 30 mL

## 2014-02-06 MED ORDER — MIDAZOLAM HCL 2 MG/2ML IJ SOLN
INTRAMUSCULAR | Status: AC
Start: 2014-02-06 — End: ?
  Filled 2014-02-06: qty 2

## 2014-02-06 MED ORDER — DEXAMETHASONE SODIUM PHOSPHATE 20 MG/5ML IJ SOLN
INTRAMUSCULAR | Status: AC
Start: 2014-02-06 — End: ?
  Filled 2014-02-06: qty 5

## 2014-02-06 MED ORDER — PHENYLEPHRINE HCL 10 MG/ML IJ SOLN
INTRAMUSCULAR | Status: DC | PRN
Start: 2014-02-06 — End: 2014-02-06
  Administered 2014-02-06: 200 ug via INTRAVENOUS

## 2014-02-06 MED ORDER — HYDROCODONE-ACETAMINOPHEN 5-325 MG PO TABS
1.0000 | ORAL_TABLET | Freq: Once | ORAL | Status: DC | PRN
Start: 2014-02-06 — End: 2014-02-06

## 2014-02-06 MED ORDER — FAMOTIDINE 20 MG/2ML IV SOLN
INTRAVENOUS | Status: AC
Start: 2014-02-06 — End: ?
  Filled 2014-02-06: qty 2

## 2014-02-06 MED ORDER — ACETAMINOPHEN 500 MG PO TABS
ORAL_TABLET | ORAL | Status: AC
Start: 2014-02-06 — End: 2014-02-06
  Administered 2014-02-06: 1000 mg via ORAL
  Filled 2014-02-06: qty 2

## 2014-02-06 MED ORDER — GABAPENTIN 100 MG PO CAPS
200.0000 mg | ORAL_CAPSULE | Freq: Once | ORAL | Status: AC
Start: 2014-02-06 — End: 2014-02-06

## 2014-02-06 MED ORDER — LIDOCAINE HCL (PF) 2 % IJ SOLN
INTRAMUSCULAR | Status: AC
Start: 2014-02-06 — End: ?
  Filled 2014-02-06: qty 5

## 2014-02-06 MED ORDER — ONDANSETRON HCL 4 MG/2ML IJ SOLN
INTRAMUSCULAR | Status: AC
Start: 2014-02-06 — End: ?
  Filled 2014-02-06: qty 2

## 2014-02-06 MED ORDER — ROCURONIUM BROMIDE 50 MG/5ML IV SOLN
INTRAVENOUS | Status: DC | PRN
Start: 2014-02-06 — End: 2014-02-06
  Administered 2014-02-06: 10 mg via INTRAVENOUS
  Administered 2014-02-06: 40 mg via INTRAVENOUS

## 2014-02-06 MED ORDER — ALBUTEROL SULFATE HFA 108 (90 BASE) MCG/ACT IN AERS
INHALATION_SPRAY | RESPIRATORY_TRACT | Status: DC | PRN
Start: 2014-02-06 — End: 2014-02-06
  Administered 2014-02-06: 2 via RESPIRATORY_TRACT

## 2014-02-06 MED ORDER — GLYCOPYRROLATE 0.2 MG/ML IJ SOLN
INTRAMUSCULAR | Status: DC | PRN
Start: 2014-02-06 — End: 2014-02-06
  Administered 2014-02-06: .6 mg via INTRAVENOUS

## 2014-02-06 MED ORDER — ACETAMINOPHEN 500 MG PO TABS
1000.0000 mg | ORAL_TABLET | Freq: Once | ORAL | Status: AC
Start: 2014-02-06 — End: 2014-02-06

## 2014-02-06 MED ORDER — MIDAZOLAM HCL 2 MG/2ML IJ SOLN
INTRAMUSCULAR | Status: DC | PRN
Start: 2014-02-06 — End: 2014-02-06
  Administered 2014-02-06: 2 mg via INTRAVENOUS

## 2014-02-06 MED ORDER — MORPHINE SULFATE 10 MG/ML IJ SOLN
INTRAMUSCULAR | Status: DC | PRN
Start: 2014-02-06 — End: 2014-02-06
  Administered 2014-02-06: 2 mg via INTRAVENOUS

## 2014-02-06 MED ORDER — DIPHENHYDRAMINE HCL 50 MG/ML IJ SOLN
12.5000 mg | Freq: Once | INTRAMUSCULAR | Status: DC | PRN
Start: 2014-02-06 — End: 2014-02-06

## 2014-02-06 MED ORDER — ROCURONIUM BROMIDE 50 MG/5ML IV SOLN
INTRAVENOUS | Status: AC
Start: 2014-02-06 — End: ?
  Filled 2014-02-06: qty 5

## 2014-02-06 MED ORDER — CEFAZOLIN 1 GM MBP (CNR)
Status: DC
Start: 2014-02-06 — End: 2014-02-06
  Filled 2014-02-06: qty 50

## 2014-02-06 MED ORDER — PROPOFOL INFUSION 10 MG/ML
INTRAVENOUS | Status: DC | PRN
Start: 2014-02-06 — End: 2014-02-06
  Administered 2014-02-06: 20 mg via INTRAVENOUS
  Administered 2014-02-06: 200 mg via INTRAVENOUS
  Administered 2014-02-06: 100 mg via INTRAVENOUS
  Administered 2014-02-06: 50 mg via INTRAVENOUS

## 2014-02-06 MED ORDER — GABAPENTIN 100 MG PO CAPS
ORAL_CAPSULE | ORAL | Status: AC
Start: 2014-02-06 — End: 2014-02-06
  Administered 2014-02-06: 200 mg via ORAL
  Filled 2014-02-06: qty 2

## 2014-02-06 MED ORDER — ONDANSETRON HCL 4 MG/2ML IJ SOLN
4.0000 mg | Freq: Once | INTRAMUSCULAR | Status: DC | PRN
Start: 2014-02-06 — End: 2014-02-06

## 2014-02-06 MED ORDER — FENTANYL CITRATE 0.05 MG/ML IJ SOLN
INTRAMUSCULAR | Status: AC
Start: 2014-02-06 — End: 2014-02-06
  Administered 2014-02-06: 50 ug via INTRAVENOUS
  Filled 2014-02-06: qty 2

## 2014-02-06 MED ORDER — GLYCOPYRROLATE 0.2 MG/ML IJ SOLN
INTRAMUSCULAR | Status: AC
Start: 2014-02-06 — End: ?
  Filled 2014-02-06: qty 3

## 2014-02-06 SURGICAL SUPPLY — 78 items
APPLICATOR HD SPRY GLUE FIBRIN (Applicator) IMPLANT
BANDAGE CMPR PLSTR CTTN CRTY CNFRM 75X4 (Bandage) ×6 IMPLANT
BANDAGE CURITY CONFORM COMPRESSION L75 IN X W4 IN 1 PLY HIGH ABSORBENT (Bandage) ×3 IMPLANT
BLADE S/SU RIBBACK CARB STL 15 (Blade) ×8 IMPLANT
COMB PURSE SZ 7IN (12/BAG) (Patient Supply) ×2 IMPLANT
DRAIN 4 FREE FLOW CHANNEL RADIOPAQUE (Drain) ×2 IMPLANT
DRAIN 4 FREE FLOW CHANNEL RADIOPAQUE BARD L1/4 IN INCISION SILICONE (Drain) ×2 IMPLANT
DRAIN INCS SIL FULL FLUT FLT BARD .25IN (Drain) ×4
DRAPE SLUSH MACHINE 66 X 44 (Drape) ×2 IMPLANT
DRESSING NON-ADHERING 3 X 16 (Dressing) ×4 IMPLANT
DRESSING PETRO 3% BI 3BRM GZE XR 8X1IN (Dressing) ×2
DRESSING PETROLATUM XEROFORM L8 IN X W1 (Dressing) ×1 IMPLANT
DRESSING PETROLATUM XEROFORM L8 IN X W1 IN 3% BISMUTH TRIBROMOPHENATE (Dressing) ×1 IMPLANT
ELECTRODE ELECTROSURGICAL NEEDLE L3 CM (Cautery) ×1 IMPLANT
ELECTRODE ELECTROSURGICAL NEEDLE L3 CM OD3/32 IN COLORADO TUNGSTEN (Cautery) ×1 IMPLANT
EVACUATOR WOUND SILICONE 100CC (Drain) ×4 IMPLANT
GLOVE SRG 8.5 BGL M LTX STRL PF TXTR (Glove) ×2
GLOVE SURGICAL 8.5 BIOGEL M POWDER FREE (Glove) ×1 IMPLANT
GLOVE SURGICAL 8.5 BIOGEL M POWDER FREE TEXTURE BEAD CUFF NONPYROGENIC (Glove) ×1 IMPLANT
INACTIVE USE LAWSON 120900 (Gown) ×4 IMPLANT
MASTISOL VIAL 2/3CC STRL (Skin Closure) ×2 IMPLANT
NEEDLE DSCT TUNG MIC STRG CO 3CM LF HEAT (Cautery) ×2
NEEDLE REG BEVEL 19GX1.5IN (Needles) ×2 IMPLANT
PAD ABD PVC CRTY 9X5IN LF STRL 3 LYR (Dressing) ×6 IMPLANT
PIN SAFETY 2 MD (Procedure Accessories) ×2 IMPLANT
RETAINER DRESSING 9 LARGE TUBULAR (Dressing) ×1 IMPLANT
RETAINER DRESSING 9 LG TUBULAR ELASTIC NET L25YD SPANDAGE PLSTR (Dressing) ×1 IMPLANT
RETAINER DRSG PLSTR 9 LG TBLR SPNDG 25YD (Dressing) ×2
SET SPRAY EASYSPRAY 1 SPRAY HEAD 2 LUMEN TB STRL FILTER CLIP LF DSPSBL (Procedure Accessories) ×1 IMPLANT
SET SPRY EASYSPRAY LF 1 SPRY HD 2 LUM (Procedure Accessories) ×2 IMPLANT
SLEEVE SEQUEN COMP KNEE REG (Procedure Accessories) ×2 IMPLANT
SOL NACL .9% IRRIG 250ML NLTX (IV Solutions) ×1
SOLUTION IRR 0.9% NACL 1000ML LF STRL (Irrigation Solutions) ×2
SOLUTION IRR 0.9% NACL 250ML LF STRL PLS (IV Solutions) ×1
SOLUTION IRRIGATION 0.9% SODIUM CHLORIDE (IV Solutions) ×1 IMPLANT
SOLUTION IRRIGATION 0.9% SODIUM CHLORIDE (Irrigation Solutions) ×1 IMPLANT
SOLUTION IRRIGATION 0.9% SODIUM CHLORIDE 1000 ML PLASTIC POUR BOTTLE (Irrigation Solutions) ×1 IMPLANT
SOLUTION IRRIGATION 0.9% SODIUM CHLORIDE 250 ML PLASTIC POUR BOTTLE (IV Solutions) ×1 IMPLANT
SOLUTION SRGSCRB 2% CHG 4OZ EXDN BTL (Prep) ×2 IMPLANT
SOLUTION SURGICAL SCRUB EXIDINE 2% CHG 4OZ BOTTLE (Prep) ×1 IMPLANT
SPONGE GAUZE L4 IN X W4 IN 16 PLY (Sponge) ×2 IMPLANT
SPONGE GAUZE L4 IN X W4 IN 16 PLY MAXIMUM ABSORBENT TRAY CURITY (Sponge) ×2 IMPLANT
SPONGE GZE PLS CTTN CRTY 4X4IN LF STRL (Sponge) ×4
SPONGE LAP 18X18IN PREWASH WHT (Sponge) ×8
SPONGE LAPAROTOMY L18 IN X W18 IN (Sponge) ×4 IMPLANT
SPONGE LAPAROTOMY L18 IN X W18 IN PREWASH WHITE (Sponge) ×4 IMPLANT
STAPLER SKIN L3.9 MM X W6.9 MM WIDE 35 (Skin Closure) ×1 IMPLANT
STAPLER SKIN L3.9 MM X W6.9 MM WIDE 35 COUNT FIX HEAD RATCHET (Skin Closure) ×1 IMPLANT
STAPLER SKN SS W PRX PX .58MM 3.9X6.9MM (Skin Closure) ×2
STRIP SKIN CLOSURE L4 IN X W1/2 IN (Dressing) ×1 IMPLANT
STRIP SKIN CLOSURE L4 IN X W1/2 IN REINFORCE STERI-STRIP POLYESTER (Dressing) ×1 IMPLANT
STRIP SKNCLS PLSTR STRSTRP 4X.5IN LF (Dressing) ×2
SUTURE ETHILON 4-0 P3 18IN (Suture) ×4 IMPLANT
SUTURE ETHILON 5-0 P3 18IN (Suture) ×4 IMPLANT
SUTURE ETHILON 6-0 P3 18IN (Suture) ×4 IMPLANT
SUTURE ETHILON UNDYED 4-0 P-3 L18 IN (Suture) ×2 IMPLANT
SUTURE ETHILON UNDYED 4-0 P-3 L18 IN MONOFILAMENT NONABSORBABLE (Suture) ×2 IMPLANT
SUTURE MERSILENE WHITE 4-0 P-3 L18 IN (Suture) ×2 IMPLANT
SUTURE MERSILENE WHITE 4-0 P-3 L18 IN BRAID NONABSORBABLE (Suture) ×2 IMPLANT
SUTURE NABSB 4-0 P-3 ETH 18IN MFL UD (Suture) ×4
SUTURE NABSB 4-0 P-3 MERS MTPS 18IN BRD (Suture) ×4
SUTURE NABSB SLK 0 PRMHND 18IN BRD TIE 6 (Suture) ×2
SUTURE NABSB SLK 5-0 P-3 PRMHND MTPS 18 (Suture) ×2
SUTURE NABSB SLK 6-0 P1 PRMHND 18IN BRD (Suture)
SUTURE SILK PERMA HAND BLACK 0 L18 IN (Suture) ×1 IMPLANT
SUTURE SILK PERMA HAND BLACK 0 L18 IN BRAID TIES 6 STRAND PRECUT (Suture) ×1 IMPLANT
SUTURE SILK PERMA HAND BLACK 5-0 P-3 L18 (Suture) ×1 IMPLANT
SUTURE SILK PERMA HAND BLACK 5-0 P-3 L18 IN BRAID NONABSORBABLE (Suture) ×1 IMPLANT
SUTURE SILK PERMA HAND BLACK 6-0 P-1 L18 (Suture) IMPLANT
SUTURE SILK PERMA HAND BLACK 6-0 P-1 L18 IN BRAID NONABSORBABLE (Suture) IMPLANT
SUTURE VICRYL 6-0 P3 18IN (Suture) IMPLANT
SYRINGE LUER LOCK 10CC (Syringes, Needles) ×2 IMPLANT
SYRINGE LUER-LOK 500 5ML (Syringes, Needles) ×8 IMPLANT
TAPE SURGICAL DURAPORE 1X10 (Tape) ×2 IMPLANT
TISSEEL 10ML (Sealant) ×1 IMPLANT
TOWEL STERILE 6-PACK (Procedure Accessories) IMPLANT
TRAY NASAL/ORAL/H (Tray) ×2 IMPLANT
TRAY SKIN DRY SCRUB (Tray) ×2 IMPLANT

## 2014-02-06 NOTE — Anesthesia Postprocedure Evaluation (Signed)
Satisfactory anesthetic recovery. Hemodynamically stable with good oxygenation and airway.     Adequate hydration and analgesia. Any postoperative nausea addressed.     No apparent anesthetic complications at this point.     May be transferred from PACU once meeting criteria.

## 2014-02-06 NOTE — Discharge Instructions (Signed)
PATIENT HAS INSTRUCTIONS AT HOME ALREADY PER DR. DUFRESNE.      Anesthesia: After Your Surgery  You've just had surgery. During surgery, you received medication called anesthesia to keep you comfortable and pain-free. After surgery, you may experience some pain or nausea. This is normal. Here are some tips for feeling better and recovering after surgery.     Stay on schedule with your medication.   Going Home  Your doctor or nurse will show you how to take care of yourself when you go home. He or she will also answer your questions. Have an adult family member or friend drive you home. For the first 24 hours after your surgery:   Do not drive or use heavy equipment.   Do not make important decisions or sign legal documents.   Avoid alcohol.   Have someone stay with you, if needed. He or she can watch for problems and help keep you safe.  Be sure to keep all follow-up doctor's appointments. And rest after your procedure for as long as your doctor tells you to.  Coping with Pain  If you have pain after surgery, pain medication will help you feel better. Take it as directed, before pain becomes severe. Also, ask your doctor or pharmacist about other ways to control pain, such as with heat, ice, and relaxation. And follow any other instructions your surgeon or nurse gives you.  Tips for Taking Pain Medication  To get the best relief possible, remember these points:   Pain medications can upset your stomach. Taking them with a little food may help.   Most pain relievers taken by mouth need at least 20 to 30 minutes to take effect.   Taking medication on a schedule can help you remember to take it. Try to time your medication so that you can take it before beginning an activity, such as dressing, walking, or sitting down for dinner.   Constipation is a common side effect of pain medications. Contact your doctor before taking any medications like laxatives or stool softeners to help relieve constipation. Also ask  about any dietary restrictions, because drinkinglots of fluids andeating foodslikefruits and vegetables that are high in fiber can also help. Remember, don't take laxatives unless your surgeon has prescribed them.   Mixing alcohol and pain medication can cause dizziness and slow your breathing. It can even be fatal. Don't drink alcohol while taking pain medication.   Pain medication can slow your reflexes. Don't drive or operate machinery while taking pain medication.  If your health care provider advises you to take acetaminophen, the generic name for Tylenol and other brand-name pain relievers, to help relieve your pain, ask for a daily dose. Remember that acetaminophen or other pain relievers may interact with prescription medicines or other over-the-counter (OTC) drugs. The FDA recommends reading OTC medication labels carefully to clearly understand the list of active ingredients, directions, and any precautions to help avoid taking too muchacetaminophen. If you have questions, ask your pharmacist or health care provider.  Managing Nausea  Some people have an upset stomach after surgery. This is often due to anesthesia, pain, pain medications, or the stress of surgery. The following tips will help you manage nausea and get good nutrition as you recover. If you were on a special diet before surgery, ask your doctor if you should follow it during recovery. These tips may help:   Don't push yourself to eat. Your body will tell you what to eat and when.  Start off with clear liquids and soup. They are easier to digest.   Progress to semisolids (mashed potatoes, applesauce, and gelatin) as you feel ready.   Slowly move to solid foods. Don't eat fatty, rich, or spicy foods at first.   Don't force yourself to have three large meals a day. Instead, eat smaller amounts more often.   Take pain medications with a small amount of solid food, such as crackers or toast to avoid nausea.  Call Your Surgeon  If.   You still have pain an hour after taking medication (it may not be strong enough).   You feel too sleepy, dizzy, or groggy (medication may be too strong).   You have side effects like nausea, vomiting, or skin changes (rash, itching, or hives).    [address] [place] [address]. All rights reserved. This information is not intended as a substitute for professional medical care. Always follow your healthcare professional's instructions.

## 2014-02-06 NOTE — H&P (Signed)
There are no changes in the history or physical within the last 24 hours

## 2014-02-06 NOTE — PACU (Signed)
Spoke with Dr. Ferd Glassing and ok with heart rate up to low 100's.  Patient feels ready to go home.  Pain tolerable and voided without difficulty.

## 2014-02-06 NOTE — Op Note (Signed)
Procedure Date: 02/06/2014     Patient Type: A     SURGEON: Marnee Guarneri MD  ASSISTANT:       PREOPERATIVE DIAGNOSIS:  Facial and neck laxity and fullness.     POSTOPERATIVE DIAGNOSIS:  Facial and neck laxity and fullness.     TITLE OF PROCEDURE:  Face and neck lift.     ANESTHESIA:  General.     DESCRIPTION OF PROCEDURE:  After mild premedication, the patient was brought to the operating room and  placed supine on the operating room table.  After successful general  endotracheal anesthesia, the patient was prepped and draped in the usual  fashion.  Xylocaine 1% with 1:100,000% epinephrine and 0.25% Marcaine was  used for local anesthesia in and around the lower face and neck.   Dissection was then carried out from the sideburn in and around the tragus,  around the lobule onto the postauricular skin and scalp.  After this was  carried out, dissection was performed along this mass and anterior neck and  jawline.  Contralateral side was opened in identical fashion and then the  submental incision was made excising the previous scar and then dissecting  anteriorly and inferiorly.  Once this was carried out, the anterior neck  was defatted and the platysma muscles, which were widely separated to  approximate the midline with a running 4-0 and interrupted 4-0 [place]  sutures.  After this was carried out, the lateral neck and jawline was  defatted.  Hemostasis was obtained with electrocautery, and then the area  was treated with fibrin glue throughout.  The skin was then drawn taut.   Two #7 flat drains were placed laterally through separate stab incisions  and the excess skin trimmed, 4-0 nylon was used in the key anchoring  points, both in the pre and postauricular areas.  Interrupted 5-0 nylon was  used for the sideburn area.  Running and interrupted 6-0 nylon was used for  the preauricular incisions, running 5-0 nylon for the postauricular and 4-0  nylon with 4-0 PDS placed in a subcuticular fashion was used to  close the  scalp.  Submental incision was closed with running 6-0 nylon.  Fatty tissue  extracted from the neck was then morcellized and then injected into the  marionette lines.  Injection sites were then closed with 6-0 interrupted  nylon.  Bacitracin ointment, Adaptic, 4 x 4 gauze, Kling, and Spandage was  used as a final head dressing.  The patient tolerated the procedure well  and returned to the recovery room in satisfactory condition.           D:  02/06/2014 13:08 PM by Dr. Odella Aquas. Ledell Noss, MD [phone])  T:  02/06/2014 16:38 PM by       Everlean Cherry: [id number]) (Doc ID: [id number])

## 2014-02-06 NOTE — Transfer of Care (Signed)
Anesthesia Transfer of Care Note    Patient: Monica Turner    Procedures performed: Procedure(s) with comments:  FACELIFT, (RHYTIDECTOMY) (COSMETIC) - RHYTIDECTOMY (COSMETIC)     Anesthesia type: General ETT    Patient location:Phase I PACU    Last vitals:   Filed Vitals:    02/06/14 1257   BP: 128/82   Pulse: 76   Temp:    Resp: 16   SpO2: 100%       Post pain: Patient not complaining of pain, continue current therapy      Mental Status:awake    Respiratory Function: tolerating face mask    Cardiovascular: stable    Nausea/Vomiting: patient not complaining of nausea or vomiting    Hydration Status: adequate    Post assessment: no apparent anesthetic complications

## 2014-02-07 ENCOUNTER — Encounter: Payer: Self-pay | Admitting: Specialist

## 2014-06-02 ENCOUNTER — Emergency Department

## 2014-06-02 ENCOUNTER — Emergency Department
Admission: EM | Admit: 2014-06-02 | Discharge: 2014-06-02 | Disposition: A | Discharge status: Home / Self Care | Attending: Emergency Medicine | Admitting: Emergency Medicine

## 2014-06-02 DIAGNOSIS — Z87891 Personal history of nicotine dependence: Secondary | ICD-10-CM | POA: Insufficient documentation

## 2014-06-02 DIAGNOSIS — Y9389 Activity, other specified: Secondary | ICD-10-CM | POA: Insufficient documentation

## 2014-06-02 DIAGNOSIS — T7840XA Allergy, unspecified, initial encounter: Secondary | ICD-10-CM | POA: Insufficient documentation

## 2014-06-02 DIAGNOSIS — Y9289 Other specified places as the place of occurrence of the external cause: Secondary | ICD-10-CM | POA: Insufficient documentation

## 2014-06-02 DIAGNOSIS — R21 Rash and other nonspecific skin eruption: Secondary | ICD-10-CM | POA: Insufficient documentation

## 2014-06-02 DIAGNOSIS — X58XXXA Exposure to other specified factors, initial encounter: Secondary | ICD-10-CM | POA: Insufficient documentation

## 2014-06-02 MED ORDER — METHYLPREDNISOLONE SODIUM SUCC 125 MG IJ SOLR
125.0000 mg | Freq: Once | INTRAMUSCULAR | Status: AC
Start: 2014-06-02 — End: 2014-06-02
  Administered 2014-06-02: 125 mg via INTRAVENOUS
  Filled 2014-06-02: qty 2

## 2014-06-02 MED ORDER — HYDROXYZINE PAMOATE 25 MG PO CAPS
50.0000 mg | ORAL_CAPSULE | Freq: Four times a day (QID) | ORAL | Status: AC | PRN
Start: 2014-06-02 — End: 2014-06-22

## 2014-06-02 MED ORDER — PREDNISONE 20 MG PO TABS
40.0000 mg | ORAL_TABLET | Freq: Every day | ORAL | Status: AC
Start: 2014-06-02 — End: 2014-06-07

## 2014-06-02 MED ORDER — FAMOTIDINE 10 MG/ML IV SOLN (WRAP)
20.0000 mg | Freq: Once | INTRAVENOUS | Status: AC
Start: 2014-06-02 — End: 2014-06-02
  Administered 2014-06-02: 20 mg via INTRAVENOUS
  Filled 2014-06-02: qty 2

## 2014-06-02 MED ORDER — DIPHENHYDRAMINE HCL 50 MG/ML IJ SOLN
50.0000 mg | Freq: Once | INTRAMUSCULAR | Status: AC
Start: 2014-06-02 — End: 2014-06-02
  Administered 2014-06-02: 50 mg via INTRAVENOUS
  Filled 2014-06-02: qty 1

## 2014-06-02 NOTE — Discharge Instructions (Signed)
Allergic Reaction     You have been seen for an allergic reaction.     An allergic reaction is when your body reacts to something it comes in contact with. This can be something you ate. It can also be something that got on your skin or that you breathed in. Insect bites sometimes cause this reaction. Wasp, hornet and bee stings often cause this reaction.     Allergic reactions can cause a few things to happen. Some people get hives (large raised welts). Others get blistered skin. More serious reactions include swelling of the lips and/or tongue. They also include difficulty breathing. This can be with or without wheezing.     Often, the exact cause of the allergic reaction is never found.     If you find you are allergic to something, avoid it. Future allergic reactions could get much worse.     General treatment for an allergic reaction includes antihistamines like diphenhydramine (Benadryl®) or prescription strength hydroxyzine (Atarax®) and steroids. Some antacids also act like antihistamines. These include famotidine (Pepcid®), ranitidine (Zantac®) or cimetidine (Tagamet®). These are often used to help with the allergic reaction.     If you get a steroid prescription, it is important to finish the entire prescription. The allergic reaction can come back suddenly (rebound) if you suddenly stop the steroid too early.     YOU SHOULD SEEK MEDICAL ATTENTION IMMEDIATELY, EITHER HERE OR AT THE NEAREST EMERGENCY DEPARTMENT, IF ANY OF THE FOLLOWING OCCURS:  · Your lips or tongue get swollen.  · You have trouble breathing or start wheezing.  · Your rash seems to get infected. Signs of infection are skin redness, pain, pus, swelling or fever (temperature higher than 100.4ºF / 38ºC).

## 2014-06-02 NOTE — ED Notes (Signed)
Pt states rash with itching started on Friday, rash symptoms subsided on their own by Sat. Returned last night. No difficulty breathing or swallowing.

## 2014-06-02 NOTE — ED Provider Notes (Signed)
Physician/Midlevel provider first contact with patient: 06/02/14 0331         History     Chief Complaint   Patient presents with   . Rash     Patient is a 53 y.o. female presenting with rash. The history is provided by the patient and the spouse.   Rash  Location:  Full body  Quality: itchiness    Severity:  Moderate  Onset quality:  Gradual  Duration:  3 days  Timing:  Intermittent  Progression:  Worsening  Chronicity:  New  Context: new detergent/soap    Relieved by:  Nothing  Exacerbated by: itching.  Ineffective treatments:  None tried  Associated symptoms: no abdominal pain, no fever, no headaches, no nausea, no shortness of breath, no sore throat, no throat swelling, no tongue swelling, no URI, not vomiting and not wheezing     53 year female with onset of itching Friday night, intermittent itching for last 3 days and then rash tonight. No new meds, new laundry detergent 2 weeks ago, no new skin products. No respiratory symptoms. Unsure of using meds with wellbutrin. States it itches and then rash comes and goes.     PCP  [facility]   Past Medical History   Diagnosis Date   . Other plastic surgery for unacceptable cosmetic appearance    . Asthma without status asthmaticus    . Headache(784.0)      Sinus   . Gastroesophageal reflux disease    . Irritable bowel syndrome    . Low back pain    . Arthritis      Neck Right Hand   . Claustrophobia    . Depression    . Seasonal allergies    . Sciatica of left side    . Smokes with greater than 40 pack year history      Quit 2 weks ago.   . Sleep apnea    LMP unsure denies pregnancy s/p ablation 11/2013    Past Surgical History   Procedure Laterality Date   . Egd, colonoscopy  2015   . Abdominoplasty, liposuction (cosmetic)  2009   . Liposuction, neck (cosmetic)  2009   . Hysteroscopy, endometrial ablation, thermachoice  2015   . Cystoscopy, ureteroscopy  2015   . Facelift, (rhytidectomy) (cosmetic)  02/06/2014     Procedure: FACELIFT, (RHYTIDECTOMY) (COSMETIC);   Surgeon: Katheren Puller, MD;  Location: [facility] OR;  Service: Plastics;  Laterality: Bilateral;  RHYTIDECTOMY (COSMETIC)        Family History   Problem Relation Age of Onset   . Anesthesia problems Sister        Social  History   Substance Use Topics   . Smoking status: Former Smoker -- 1.00 packs/day for 40 years     Quit date: 01/16/2014   . Smokeless tobacco: Not on file   . Alcohol Use: Yes      Comment: 2 X Month 2-4 Beers   .     No Known Allergies    Current/Home Medications    BUPROPION (WELLBUTRIN) 75 MG TABLET    75 mg 2 (two) times daily.       CLONAZEPAM (KLONOPIN) 0.5 MG TABLET    0.5 mg 2 (two) times daily as needed.       DULOXETINE (CYMBALTA) 30 MG CAPSULE    Take 60 mg by mouth daily.       NEXIUM 40 MG CAPSULE    Take 40 mg by mouth every  morning before breakfast.       combi patch every 3 days for sweating.     Review of Systems   Constitutional: Negative for fever and chills.   HENT: Negative for congestion, rhinorrhea, sore throat, trouble swallowing and voice change.    Respiratory: Negative for cough, shortness of breath and wheezing.    Gastrointestinal: Negative for nausea, vomiting and abdominal pain.   Musculoskeletal: Negative for back pain and gait problem.   Skin: Positive for color change and rash.   Neurological: Negative for weakness, numbness and headaches.   All other systems reviewed and are negative.      Physical Exam    BP: 114/88 mmHg, Heart Rate: 98, Temp: 98 F (36.7 C), Resp Rate: 19, SpO2: 100 %    Physical Exam   Constitutional: She is oriented to person, place, and time. She appears well-developed and well-nourished. No distress.   HENT:   Head: Normocephalic and atraumatic.   Nose: Nose normal.   Mouth/Throat: Oropharynx is clear and moist and mucous membranes are normal. No oropharyngeal exudate, posterior oropharyngeal edema or posterior oropharyngeal erythema.   Eyes: Conjunctivae and EOM are normal. Pupils are equal, round, and reactive to light.    Neck: Normal range of motion. Neck supple.   Cardiovascular: Normal rate, normal heart sounds and intact distal pulses.    No murmur heard.  Pulmonary/Chest: Effort normal and breath sounds normal. No respiratory distress. She has no wheezes. She has no rales.   Abdominal: Soft. Bowel sounds are normal. She exhibits no distension. There is no tenderness.   Musculoskeletal: Normal range of motion. She exhibits no edema or tenderness.   Lymphadenopathy:     She has no cervical adenopathy.   Neurological: She is alert and oriented to person, place, and time. No cranial nerve deficit. She exhibits normal muscle tone. Coordination and gait normal.   Skin: Skin is warm and dry. Rash noted. She is not diaphoretic. There is erythema. No pallor.        Itching to right ac, dorsum of left foot and back  Hives noted on right arm only    Psychiatric: She has a normal mood and affect. Her behavior is normal.   Nursing note and vitals reviewed.      MDM and ED Course     ED Medication Orders     Start     Status Ordering Provider    06/02/14 [phone]  famotidine (PEPCID) injection 20 mg   Once     Route: Intravenous  Ordered Dose: 20 mg     Last MAR action:  Given Reginia Forts    06/02/14 9604  methylprednisolone sodium succinate (Solu-MEDROL) injection 125 mg   Once     Route: Intravenous  Ordered Dose: 125 mg     Last MAR action:  Given Reginia Forts    06/02/14 0332  diphenhydrAMINE (BENADRYL) injection 50 mg   Once     Route: Intravenous  Ordered Dose: 50 mg     Last MAR action:  Given Jala Dundon J           MDM  Number of Diagnoses or Management Options  Allergic reaction, initial encounter:   Rash:   Diagnosis management comments: Dr. Marlane Hatcher  is the primary attending for this patient and has obtained and performed the history, PE, and medical decision making for this patient.    Oxygen saturation by pulse oximetry is 95%-100%, Normal.  Interventions: None Needed  and Patient Observed.    Contact allergy felt to  be etiology, discussed with patient about plan of care and followup with pcp. Encouraged to return for any worsening symptoms.     Patient Progress  Patient progress: improved        Procedures    Clinical Impression & Disposition     Clinical Impression  Final diagnoses:   Allergic reaction, initial encounter   Rash        ED Disposition     Discharge Hollie L Massoud discharge to home/self care.    Condition at disposition: Stable             New Prescriptions    HYDROXYZINE (VISTARIL) 25 MG CAPSULE    Take 2 capsules (50 mg total) by mouth 4 (four) times daily as needed for Itching.    PREDNISONE (DELTASONE) 20 MG TABLET    Take 2 tablets (40 mg total) by mouth daily.                 Reginia Forts, MD  06/02/14 [phone]

## 2014-06-02 NOTE — ED Notes (Signed)
Redness and hives improved, pt states she feels less itchy, but feels restless and weird. Covered up with blanket and sunglasses on while lying in bed. Dr Ramsay aware.

## 2014-07-18 ENCOUNTER — Other Ambulatory Visit: Payer: Self-pay

## 2014-09-06 ENCOUNTER — Emergency Department
Admission: EM | Admit: 2014-09-06 | Discharge: 2014-09-06 | Disposition: A | Discharge status: Home / Self Care | Attending: Emergency Medicine | Admitting: Emergency Medicine

## 2014-09-06 ENCOUNTER — Emergency Department

## 2014-09-06 DIAGNOSIS — J0110 Acute frontal sinusitis, unspecified: Secondary | ICD-10-CM | POA: Insufficient documentation

## 2014-09-06 DIAGNOSIS — B9789 Other viral agents as the cause of diseases classified elsewhere: Secondary | ICD-10-CM | POA: Insufficient documentation

## 2014-09-06 DIAGNOSIS — K219 Gastro-esophageal reflux disease without esophagitis: Secondary | ICD-10-CM | POA: Insufficient documentation

## 2014-09-06 DIAGNOSIS — J208 Acute bronchitis due to other specified organisms: Secondary | ICD-10-CM | POA: Insufficient documentation

## 2014-09-06 DIAGNOSIS — J45909 Unspecified asthma, uncomplicated: Secondary | ICD-10-CM | POA: Insufficient documentation

## 2014-09-06 DIAGNOSIS — Z87891 Personal history of nicotine dependence: Secondary | ICD-10-CM | POA: Insufficient documentation

## 2014-09-06 MED ORDER — BENZONATATE 100 MG PO CAPS
100.0000 mg | ORAL_CAPSULE | Freq: Three times a day (TID) | ORAL | Status: DC | PRN
Start: 2014-09-06 — End: 2015-08-02

## 2014-09-06 MED ORDER — SODIUM CHLORIDE-SODIUM BICARB 2300-700 MG NA KIT
1.0000 | PACK | Freq: Every day | NASAL | Status: DC
Start: 2014-09-06 — End: 2015-08-07

## 2014-09-06 MED ORDER — AZITHROMYCIN 250 MG PO TABS
500.0000 mg | ORAL_TABLET | Freq: Once | ORAL | Status: AC
Start: 2014-09-06 — End: 2014-09-06
  Administered 2014-09-06: 500 mg via ORAL
  Filled 2014-09-06: qty 2

## 2014-09-06 MED ORDER — HYDROCODONE-ACETAMINOPHEN 5-325 MG PO TABS
1.0000 | ORAL_TABLET | Freq: Four times a day (QID) | ORAL | Status: DC | PRN
Start: 2014-09-06 — End: 2015-06-13

## 2014-09-06 MED ORDER — AZITHROMYCIN 250 MG PO TABS
250.0000 mg | ORAL_TABLET | Freq: Every day | ORAL | Status: AC
Start: 2014-09-06 — End: 2014-09-10

## 2014-09-06 MED ORDER — HYDROCODONE-ACETAMINOPHEN 5-325 MG PO TABS
1.0000 | ORAL_TABLET | Freq: Once | ORAL | Status: AC
Start: 2014-09-06 — End: 2014-09-06
  Administered 2014-09-06: 1 via ORAL
  Filled 2014-09-06: qty 1

## 2014-09-06 NOTE — Discharge Instructions (Signed)
Sinusitis    You have been seen for a sinus infection.    Sinus infections are common. They often happen after people have a virus or a common cold.    Symptoms are: Pain in the face, green or yellow mucous from the nose, fever (temperature higher than 100.4F / 38C) and chills. There may also be a feeling of fullness or pressure in the face.    Decongestants may be used to help the sinuses drain. Sometimes a steroid spray is used.    You may need antibiotics for the infection. Not all cases of sinusitis need antibiotics. Viruses cause most sinusitis. Antibiotics do not work on viruses. Sometimes sinusitis and be caused by bacteria. These cases usually get better with medicine to help the sinuses drain. Antibiotics should be given only to patients who have symptoms for more than 2 weeks and if they have fever, severe sinus pain and pus mixed in with the mucus.    YOU SHOULD SEEK MEDICAL ATTENTION IMMEDIATELY, EITHER HERE OR AT THE NEAREST EMERGENCY DEPARTMENT, IF ANY OF THE FOLLOWING OCCURS:   You do not get better with treatment.   You have severe headaches and high fever (temperature higher than 100.4F / 38C) or any confusion.   You have worse pain in the face. Your face gets more swollen.        Upper Respiratory Infection    You have been diagnosed with a viral upper respiratory infection, often called a "URI."    The symptoms of a viral upper respiratory infection may include fever (temperature higher than 100.4F / 38C), runny nose, congestion and sinus fullness, facial pain, earache, sore throat, cough, and occasionally wheezing. The symptoms usually improve within 3 to 4 days. It might take up to 10 days before you feel completely better.    URI's are treated with fluids, rest, and medication for fever and pain. Sometimes decongestants, cough medications or-medications for wheezing can also help.    Antibiotics have NO effect whatsoever on viruses and ARE NOT needed for a URI. Taking  antibiotics when they are not necessary can cause side effects (like diarrhea or allergic reactions). It can also cause resistance, meaning antibiotics won't work when you need them in the future.    You do not need to follow up with a doctor unless you feel worse or have other concerns.    YOU SHOULD SEEK MEDICAL ATTENTION IMMEDIATELY, EITHER HERE OR AT THE NEAREST EMERGENCY DEPARTMENT, IF ANY OF THE FOLLOWING OCCURS:   You have new or worse symptoms or concerns.   You are short of breath.   You have a severe headache, stiff neck, confusion, or problems thinking.   You have nasal discharge, fever (temperature higher than 100.4F / 38C), or productive cough (one that brings up mucous from the lungs) lasting more that 10 days. Occasionally a viral cold may lead into a bacterial infection, such as a sinus infection, ear infection or pneumonia. Taking antibiotics during the cold will NOT prevent these infections, but if a secondary infection occurs, you might require additional treatment.

## 2014-09-06 NOTE — ED Notes (Signed)
Pt states has been sick with cough, shortness of breath and sinus issues for approx 1 week. 1/2 hr PTA had 2 puffs of inhaler, 2 tsp of Robitussin and 600mg  of Advil with minimal relief.

## 2014-09-06 NOTE — ED Provider Notes (Signed)
Physician/Midlevel provider first contact with patient: 09/06/14 1856         History     Chief Complaint   Patient presents with   . Shortness of Breath   . Cough     Patient is a 53 y.o. female presenting with URI. The history is provided by the patient.   URI  Presenting symptoms: congestion, cough, facial pain and rhinorrhea    Presenting symptoms: no fever and no sore throat    Presenting symptoms comment:  Sinus drainage  Severity:  Moderate  Onset quality:  Gradual  Duration:  1 week  Timing:  Constant  Progression:  Worsening  Chronicity:  New  Relieved by:  Nothing  Worsened by:  Nothing tried  Ineffective treatments:  Decongestant and drinking  Associated symptoms: sinus pain    Associated symptoms: no arthralgias, no headaches, no myalgias, no neck pain, no sneezing, no swollen glands and no wheezing    Risk factors: no immunosuppression, no recent illness, no recent travel and no sick contacts             Past Medical History   Diagnosis Date   . Other plastic surgery for unacceptable cosmetic appearance    . Asthma without status asthmaticus    . Headache(784.0)      Sinus   . Gastroesophageal reflux disease    . Irritable bowel syndrome    . Low back pain    . Arthritis      Neck Right Hand   . Claustrophobia    . Depression    . Seasonal allergies    . Sciatica of left side    . Smokes with greater than 40 pack year history      Quit 2 weks ago.   . Sleep apnea        Past Surgical History   Procedure Laterality Date   . Egd, colonoscopy  2015   . Abdominoplasty, liposuction (cosmetic)  2009   . Liposuction, neck (cosmetic)  2009   . Hysteroscopy, endometrial ablation, thermachoice  2015   . Cystoscopy, ureteroscopy  2015   . Facelift, (rhytidectomy) (cosmetic)  02/06/2014     Procedure: FACELIFT, (RHYTIDECTOMY) (COSMETIC);  Surgeon: Katheren Puller, MD;  Location: [facility] OR;  Service: Plastics;  Laterality: Bilateral;  RHYTIDECTOMY (COSMETIC)        Family History   Problem Relation Age of  Onset   . Anesthesia problems Sister        Social  History   Substance Use Topics   . Smoking status: Former Smoker -- 1.00 packs/day for 40 years     Quit date: 01/16/2014   . Smokeless tobacco: Not on file   . Alcohol Use: Yes      Comment: 2 X Month 2-4 Beers       .     No Known Allergies    Discharge Medication List as of 09/06/2014  8:13 PM      CONTINUE these medications which have NOT CHANGED    Details   !! DULoxetine (CYMBALTA) 60 MG capsule , Starting 08/30/2014, Until Discontinued, Historical Med      PROAIR HFA 108 (90 BASE) MCG/ACT inhaler , Starting 06/22/2014, Until Discontinued, Historical Med      buPROPion (WELLBUTRIN) 75 MG tablet 75 mg 2 (two) times daily.   , Starting 05/02/2014, Until Discontinued, Historical Med      clonazePAM (KLONOPIN) 0.5 MG tablet 0.5 mg 2 (two) times daily as needed.   ,  Starting 05/02/2014, Until Discontinued, Historical Med      !! DULoxetine (CYMBALTA) 30 MG capsule Take 60 mg by mouth daily.   , Starting 04/10/2014, Until Discontinued, Historical Med      NEXIUM 40 MG capsule Take 40 mg by mouth every morning before breakfast.   , Starting 04/10/2014, Until Discontinued, Historical Med       !! - Potential duplicate medications found. Please discuss with provider.           Review of Systems   Constitutional: Negative for fever and chills.   HENT: Positive for congestion and rhinorrhea. Negative for drooling, facial swelling, sneezing, sore throat and trouble swallowing.    Respiratory: Positive for cough. Negative for chest tightness, shortness of breath and wheezing.         States she is sob because she can't breathe through her nose due to the congestion.  Has productive phlegm from coughing.   Cardiovascular: Positive for chest pain. Negative for palpitations.        Pt has mild chest soreness from coughing. State only has pain when she coughs and afraid to cough due to pain.    Gastrointestinal: Negative for nausea, vomiting and abdominal pain.   Genitourinary:  Negative for dysuria, frequency and flank pain.   Musculoskeletal: Negative for myalgias, back pain, arthralgias and neck pain.   Skin: Negative for rash.   Neurological: Negative for dizziness, weakness and headaches.   Psychiatric/Behavioral: Negative for confusion, self-injury and decreased concentration.   All other systems reviewed and are negative.      Physical Exam    BP: 122/80 mmHg, Heart Rate: 82, Temp: 98 F (36.7 C), Resp Rate: 18, SpO2: 98 %    Physical Exam   Constitutional: She is oriented to person, place, and time. She appears well-developed and well-nourished.   HENT:   Head: Normocephalic and atraumatic.   Right Ear: External ear normal.   Left Ear: External ear normal.   Nose: Mucosal edema and rhinorrhea present. Right sinus exhibits frontal sinus tenderness. Left sinus exhibits frontal sinus tenderness.   Eyes: Conjunctivae and EOM are normal. Pupils are equal, round, and reactive to light. No scleral icterus.   Neck: Normal range of motion. Neck supple. No spinous process tenderness and no muscular tenderness present. Normal range of motion present.   No meningeal signs   Cardiovascular: Normal rate, regular rhythm, normal heart sounds and intact distal pulses.    Pulses:       Dorsalis pedis pulses are 2+ on the right side, and 2+ on the left side.   Pulmonary/Chest: Effort normal and breath sounds normal. No respiratory distress. She has no wheezes. She exhibits no tenderness.   Abdominal: Soft. Bowel sounds are normal. She exhibits no distension. There is no tenderness.   No pulsatile mass   Musculoskeletal: Normal range of motion. She exhibits no edema.   Neurological: She is alert and oriented to person, place, and time.   Skin: Skin is warm and dry. No rash noted. No pallor.   Psychiatric: She has a normal mood and affect. Her behavior is normal. Thought content normal.   Nursing note and vitals reviewed.        MDM and ED Course     ED Medication Orders     Start     Status Ordering  Provider    09/06/14 1925  HYDROcodone-acetaminophen (NORCO) 5-325 MG per tablet 1 tablet   Once     Route: Oral  Ordered Dose: 1 tablet     Last MAR action:  Given Tobie Perdue    09/06/14 1925  azithromycin (ZITHROMAX) tablet 500 mg   Once     Route: Oral  Ordered Dose: 500 mg     Last MAR action:  Given Duanne Duchesne              MDM  Number of Diagnoses or Management Options  Acute frontal sinusitis, recurrence not specified:   Viral bronchitis:   Diagnosis management comments: Patient with one week of viral upper respiratory infection symptoms have moderate nasal congestion, coughing, nasal drainage and facial pain.  Patient states that she has had sinusitis before and this feels similar.  States that her nasal drainage is initially started off as clear and turned to yellow nausea, dark green with bowel odor.  Has also had a moderate cough which is keeping her awake at night and patient states that she has some chest soreness due to coughing.  States she is afraid to cough due to the pain.  Denies any shortness of breath, nausea, vomiting, diarrhea, and no other sick contacts.      On examination, patient is very well-appearing with normal vitals.  Noted to have mild sinus tenderness to palpation to the frontal sinuses.  Lungs sounds are clear.  Patient no respiratory distress.  EKG is normal.  Symptoms are very inconsistent with ACS.  Symptoms are very consistent with viral upper respiratory infection with likely bacterial sinusitis.  Plan antibiotics and supportive treatment.  Patient is nontoxic appearing and no signs of systemic infection.  I, Carolina Sink- Physician Assistant, have been the primary provider for this pt during their ED stay.     The attending signature signifies review and agreement of the history, physical examination, evaluation, clinical impression and plan except as noted.     02 sat  98%ra, which is nml.     Ekg: nsr. Low voltage qrs. Nml t waves. No stemi          Results     ** No  results found for the last 24 hours. **        Radiology Results (24 Hour)     ** No results found for the last 24 hours. **            Procedures    Clinical Impression & Disposition     Clinical Impression  Final diagnoses:   Acute frontal sinusitis, recurrence not specified   Viral bronchitis        ED Disposition     Discharge Areyana L Doolin discharge to home/self care.    Condition at disposition: Stable             Discharge Medication List as of 09/06/2014  8:13 PM      START taking these medications    Details   azithromycin (ZITHROMAX) 250 MG tablet Take 1 tablet (250 mg total) by mouth daily. Start tomorrow, Starting 09/06/2014, Until Wed 09/10/14, Print      HYDROcodone-acetaminophen (NORCO) 5-325 MG per tablet Take 1 tablet by mouth every 6 (six) hours as needed for Pain., Starting 09/06/2014, Until Discontinued, Print      Sodium Chloride-Sodium Bicarb (NETI POT SINUS WASH) 2300-700 MG Kit 1 packet by Nasal route daily., Starting 09/06/2014, Until Discontinued, Print                         [place], [place], [place]  09/06/14  2307

## 2014-09-07 LAB — ECG 12-LEAD
Atrial Rate: 78 {beats}/min
P Axis: 40 degrees
P-R Interval: 144 ms
Q-T Interval: 376 ms
QRS Duration: 72 ms
QTC Calculation (Bezet): 428 ms
R Axis: 14 degrees
T Axis: 29 degrees
Ventricular Rate: 78 {beats}/min

## 2015-02-04 ENCOUNTER — Encounter (INDEPENDENT_AMBULATORY_CARE_PROVIDER_SITE_OTHER): Payer: Self-pay | Admitting: Family Medicine

## 2015-02-04 ENCOUNTER — Ambulatory Visit (INDEPENDENT_AMBULATORY_CARE_PROVIDER_SITE_OTHER): Admitting: Family Medicine

## 2015-02-04 VITALS — BP 107/75 | HR 76 | Temp 97.8°F | Resp 18 | Ht 66.0 in | Wt 169.2 lb

## 2015-02-04 DIAGNOSIS — J019 Acute sinusitis, unspecified: Secondary | ICD-10-CM

## 2015-02-04 DIAGNOSIS — J309 Allergic rhinitis, unspecified: Secondary | ICD-10-CM

## 2015-02-04 DIAGNOSIS — J45909 Unspecified asthma, uncomplicated: Secondary | ICD-10-CM

## 2015-02-04 MED ORDER — SULFAMETHOXAZOLE-TRIMETHOPRIM 800-160 MG PO TABS
ORAL_TABLET | ORAL | Status: DC
Start: 2015-02-04 — End: 2015-05-15

## 2015-02-04 NOTE — Progress Notes (Signed)
Subjective:       Patient ID: Monica Turner is a 54 y.o. female.    HPI    54/F presents with purulent nasal drainage for over two weeks.  She says she has year-round allergies and has some congestion year-round.  A couple weeks ago she started getting purulent stuff out.  Today she lost her voice.  She was just started on Singulair 3-4 days ago for her allergies and asthma.  She has had some SOB today.  No cough.  Uses nasal saline spray.    Review of Systems  Const:  no fever, no chills, no sweats, no body aches  Eyes:  no eye itching, no eye discharge, no eye redness  Ears:  no ear pain, no ear discharge  Throat:  no sore throat, no hoarseness  Pulm:  no cough, no wheezing  Cardio:  no chest pain  GI:  no nausea, no vomiting, no abdominal pain, no diarrhea  Neuro:  no headaches, no dizziness  Skin:  no rash    Past medical history, family history, social history, current medications, and drug allergies reviewed.        Objective:     Physical Exam  BP 107/75 mmHg  Pulse 76  Temp(Src) 97.8 F (36.6 C) (Tympanic)  Resp 18  Ht 1.676 m (5\' 6" )  Wt 76.749 kg (169 lb 3.2 oz)  BMI 27.32 kg/m2  VS reviewed.  General:  Well-developed, well-nourished, not in acute respiratory distress, very hoarse  Head:  Normocephalic, atraumatic  Eyes:  PERLA, pink conjunctivae, anicteric sclerae, no discharge  Ears:  External ear canals clear, tympanic membranes clear  Nose:  External nose normal, mucosa inflamed  Throat:  Tonsils nonenlarged and nonerythematous, no exudates, uvula normal  Neck:  Trachea midline, thyroid normal  Lungs:  Clear breath sounds, no rales, no wheezes, no rhonchi  Heart:  Rate normal, regular rhythm, no murmur appreciated  Lymphatics:  No cervical lymphadenopathy  Psych:  Normal mood, appropriate affect  Neurologic:  Alert, oriented to time/place/person  Skin:  Warm, dry, good turgor, no rash          Assessment:       1. Acute sinusitis, recurrence not specified, unspecified location   sulfamethoxazole-trimethoprim (BACTRIM DS) 800-160 MG per tablet   2. Allergic rhinitis, unspecified allergic rhinitis type     3. Uncomplicated asthma, unspecified asthma severity              Plan:       Advised to switch from saline spray to saline rinses instead.  Do at least once daily after coming home from work.  Explained benefits of this, especially flushing out pollen and irritants from nose.  Start Flonase and use daily.  Continue daily SIngulair.  Albuterol inhaler q4h PRN.  If SOB worsens, f/u with PCP.    Avoid allergens as much as possible.  Stay indoors if possible during days with high pollen count.  Shower in the evening instead of the morning.  Keep house clean and as dust-free as possible.  Frequently vacuum all carpets.  Wash bed linens once weekly.  Dust mite covers for mattresses may be helpful.  [email or web address]

## 2015-02-04 NOTE — Patient Instructions (Addendum)
Acute Sinusitis  Acute sinusitis is inflammation (irritation and swelling) of the sinuses. It is usually from a bacterial infection that follows an upper respiratory viral infection. Your doctor can help you find relief. Read on to learn more.    What is acute sinusitis?  Sinuses are air-filled spaces in the skull behind the face. They are kept moist and clean by a lining of mucosa. Things such as pollen, smoke, and chemical fumes can irritate the mucosa. It can then become inflamed (swell up). As a response to irritation, the mucosa makes more mucus and other fluids. Tiny hairlike cilia cover the mucosa. Cilia help transport mucus toward the opening of the sinus. Too much mucus may cause the cilia to stop working. This blocks the sinus opening. A buildup of fluid in the sinuses then leads to symptoms such as pain and pressure. It can also encourage growth of bacteria in the sinuses.  Common symptoms of acute sinusitis  You may have:   Facial sorenesspain   Headache   Fever   Postnasal drip (drainage in the back of the throat)   Congestion   Drainage that is thick and colored, instead of clear   Cough  Diagnosis of acute sinusitis  The doctor will ask about your symptoms and medical history.He or she will examine your ear, nose, and throat. X-rays are usually not needed. If your sinusitis recurs, you may have a culture to check for bacteria or imaging tests.    An evaluation will be done. A culture (sample of mucus) is sometimes taken to check for bacteria. If you have multiple bouts of sinusitis, imaging (X-rays or CAT scans) may be done to check for an anatomic cause of the infection.  Treatment of acute sinusitis  Treatment is designed to unblock the sinus opening and help the cilia work again. Antihistamine and decongestant medications may be prescribed. These can reduce inflammation and decrease fluid production. If a bacterial infection is present, it istreated with antibiotic medication for 10 to  14 days. This medication should be taken until it is gone, even if you feel better.   [address] [employer] [address], [place], [place] [address]. All rights reserved. This information is not intended as a substitute for professional medical care. Always follow your healthcare professional's instructions.            Avoid allergens as much as possible.  Stay indoors if possible during days with high pollen count.  Shower in the evening instead of the morning.  Keep house clean and as dust-free as possible.  Frequently vacuum all carpets.  Wash bed linens once weekly.  Dust mite covers for mattresses may be helpful.  [email or web address].missionallergy.com      Allergic Rhinitis  Allergic rhinitis is an allergic reaction that affects the nose, and often the eyes. It's often known asnasal allergies. Nasal allergies are often due to things in the environment that are breathed in. Depending what the child is sensitive to, nasal allergies may occur only during certain seasons. Or they may occur year round. Common indoor allergens include house dust mites, mold, cockroaches, and pet dander. Outdoor allergens include pollen from trees, grasses, and weeds.  Symptoms include a drippy, stuffy, and itchy nose. They also include sneezing and red and itchy eyes. You may feel tired more often. Severe allergies may also affect your breathing and trigger a condition called asthma.  Tests can be done to see what allergens are affecting you. You may be referred  to an allergy specialist for testing and further evaluation.  Home care  The healthcare provider may prescribe medications to help relieve allergy symptoms. Follow instructions when giving these medications to your child.  Ask the provider for advice on how to avoid substances that your child is allergic to.Below are a few tips for each type of allergen.  Pet dander:   Do not have pets with fur and feathers.   If you cannot avoid having a pet, keep it out of child's  bedroom and off upholstered furniture.  Pollen:   When pollen counts are high, keep windows of your car and home closed. If possible, use an air conditioner instead.   Wear a filter mask when mowing or doing yard work.  House dust mites:   Wash bedding every week in warm water and detergent and dry on a hot setting.   Cover the mattress, box spring, and pillows with allergy covers.   If possible, sleep in a room with no carpet, curtains, or upholstered furniture.  Cockroaches:   Store food in sealed containers.   Remove garbage from the home promptly.   Fix water leaks  Mold:   Keep humidity low by using a dehumidifier or air conditioner. Keep the dehumidifier and air conditioner clean and free of mold.   Clean moldy areas with bleach and water.  In general:   Vacuum once or twice a week. If possible, use a vacuum with a high-efficiency particulate air (HEPA) filter.   Do not smoke. Avoid cigarette smoke. Cigarette smoke is an irritant that can make symptoms worse.  Follow-up care  Follow up as advised by the health care provider or our staff. If you were referred to an allergy specialist, make this appointment promptly.  When to seek medical attention  Call your healthcare provider right away if the following occur:   Coughing or wheezing   Fever greater than 100.50F (38C)   Continuing symptoms, new symptoms, or worsening symptoms  Call 911 right awayif you have:   Trouble breathing   Hives (raised red bumps)   Severe swelling of the face or severe itching of the eyes or mouth   2000-2015 [employer] [address] [place], [place] [address]. All rights reserved. This information is not intended as a substitute for professional medical care. Always follow your healthcare professional's instructions.        Asthma Medication  Medications play a key role in controlling asthma. Some help control asthma symptoms and prevent flare-ups. Others are used to treat symptoms when they  occur. Always take your medication as prescribed. Know the names of your medication and how and when to use them. if you have any questions about your medications, talk with your health care provider or pharmacist.    Quick-Relief Medication  Quick-relief (also called "rescue") medications work by relaxing the muscles around the airways. This helps ease symptoms such as coughing, wheezing, and shortness of breath. Keep your quick-relief inhaler with you at all times. Quick-relief medications:   Are inhaled when needed. Use your quick-relief medication when you first notice your asthma is getting worse.   Start to open the airways within a few minutes after you use them.   Can help stop a flare-up once it has begun.   May be used beforeexercise.  Long-Term Control Medication  Long-term control (also called "maintenance" or controller) medications help reduce swelling and inflammation of the airways. This makes the airways less sensitive to triggers and  less likely to flare up. Long-term control medications:   Are taken on a schedule--for most people, every day. They are taken even when you feel fine.   Help keep asthma under control so you're less likely to have symptoms.   Will NOT stop a flare-up once it has begun.    Tips for Taking Medication  Remembering to take medication each day can be hard for anyone. It can be even harder to remember when you don't have symptoms. Try these tips:   Develop a routine. For example, take long-term controllers as part of getting ready for bed.   Make sure you understand what long-term controllers do and don't do.   Refill your prescriptions on time, or even ahead of time, so you don't run out.   Carry your quick-relief medication with you. If you can, keep a spare quick-relief inhaler at work, at school, or in your gym bag.   When you travel, make sure you have enough medication to last for your entire trip.   When traveling by air, keep your medications with you,  not packed in your luggage.   Make sure you know how to tell if your inhaler is empty. Ask yourprovider or pharmacist, or check the instructions that comewith yourinhaler.  Working with Your Health Care Provider  By working with your health care provider, you can get the most benefit from your medication. Talkwith your health care provider about:   Getting the right dose.Over time, your health care provider may raise or lower the dose of yourcontroller medication. The goal is to find the amount of medication to keep asthma in control, without taking more than is needed.   Finding the right medications for you.Each person is unique. It may take a few tries to find the right medication or combination of medications for you. If one medication doesn't work well for you, another may work better.   Minimizing side effects.If you have side effects, don't just stop taking your medication. Instead, call your health care provider. A new medication or change in the amount of medication may solve the problem.   [address] [employer] [address], [place], [place] [address]. All rights reserved. This information is not intended as a substitute for professional medical care. Always follow your healthcare professional's instructions.

## 2015-03-11 ENCOUNTER — Other Ambulatory Visit (INDEPENDENT_AMBULATORY_CARE_PROVIDER_SITE_OTHER): Payer: Self-pay

## 2015-03-11 NOTE — Telephone Encounter (Signed)
Pt is also requesting Combipatch 50/140 for a 90 day supply to be sent to [facility]

## 2015-03-11 NOTE — Telephone Encounter (Signed)
Dr D,   Pt is requesting refills for Montelukast 10 mg 90 day supply to be sent to her mail order pharmacy.   LOV: 11/27/14  Please advise and respond to Triage Team A

## 2015-03-16 MED ORDER — ESTRADIOL-NORETHINDRONE ACET 0.05-0.14 MG/DAY TD PTTW
1.0000 | MEDICATED_PATCH | TRANSDERMAL | Status: DC
Start: 2015-03-16 — End: 2015-03-23

## 2015-03-16 NOTE — Telephone Encounter (Signed)
Sent it as the dose that was in on angela rourkes note 09/17/14

## 2015-03-23 ENCOUNTER — Other Ambulatory Visit (INDEPENDENT_AMBULATORY_CARE_PROVIDER_SITE_OTHER): Payer: Self-pay | Admitting: Internal Medicine

## 2015-03-23 MED ORDER — ESTRADIOL-NORETHINDRONE ACET 0.05-0.14 MG/DAY TD PTTW
1.0000 | MEDICATED_PATCH | TRANSDERMAL | Status: DC
Start: 2015-03-23 — End: 2015-04-16

## 2015-03-23 NOTE — Telephone Encounter (Signed)
Faxed again

## 2015-04-10 ENCOUNTER — Other Ambulatory Visit (INDEPENDENT_AMBULATORY_CARE_PROVIDER_SITE_OTHER): Payer: Self-pay

## 2015-04-10 MED ORDER — MONTELUKAST SODIUM 10 MG PO TABS
10.0000 mg | ORAL_TABLET | Freq: Every evening | ORAL | Status: DC
Start: 2015-04-10 — End: 2016-01-02

## 2015-04-10 MED ORDER — ESOMEPRAZOLE MAGNESIUM 40 MG PO CPDR
40.0000 mg | DELAYED_RELEASE_CAPSULE | Freq: Every morning | ORAL | Status: DC
Start: 2015-04-10 — End: 2015-08-02

## 2015-04-10 MED ORDER — ESOMEPRAZOLE MAGNESIUM 40 MG PO CPDR
40.0000 mg | DELAYED_RELEASE_CAPSULE | Freq: Every morning | ORAL | Status: DC
Start: 2015-04-10 — End: 2016-04-11

## 2015-04-10 MED ORDER — MONTELUKAST SODIUM 10 MG PO TABS
10.0000 mg | ORAL_TABLET | Freq: Every evening | ORAL | Status: DC
Start: 2015-04-10 — End: 2015-06-13

## 2015-04-10 NOTE — Telephone Encounter (Signed)
Rec'd call from patient. Request RF of Nexium and Singular. To be sent to [facility] and [facility] listed on file.  LS: 11/27/14, LF: 11/27/14. Verified patient information/records from [facility]. ** Please verify quantity/refills and send to pharmacy listed. (Pre-Loaded).  Please Reply to PP Triage A,  Thanks.

## 2015-04-16 ENCOUNTER — Other Ambulatory Visit (INDEPENDENT_AMBULATORY_CARE_PROVIDER_SITE_OTHER): Payer: Self-pay

## 2015-04-16 MED ORDER — ESTRADIOL-NORETHINDRONE ACET 0.05-0.14 MG/DAY TD PTTW
1.0000 | MEDICATED_PATCH | TRANSDERMAL | Status: DC
Start: 2015-04-16 — End: 2016-01-20

## 2015-04-16 NOTE — Telephone Encounter (Signed)
Rec'd voicemail call from [facility]. Request RF of Combi-Patch. To be sent to [facility] listed on file.  LS: 11/27/14, LF: 03/23/15 (Local). Verified patient information/records from [facility]. ** Please verify quantity/refills and send to pharmacy listed. (Pre-Loaded).   Please Reply to PP Triage A,  Thanks.

## 2015-05-15 ENCOUNTER — Encounter (INDEPENDENT_AMBULATORY_CARE_PROVIDER_SITE_OTHER): Payer: Self-pay | Admitting: Family Medicine

## 2015-05-15 ENCOUNTER — Ambulatory Visit (INDEPENDENT_AMBULATORY_CARE_PROVIDER_SITE_OTHER): Admitting: Family Medicine

## 2015-05-15 VITALS — BP 106/70 | HR 84 | Temp 97.2°F | Resp 20 | Ht 66.0 in | Wt 164.0 lb

## 2015-05-15 DIAGNOSIS — J069 Acute upper respiratory infection, unspecified: Secondary | ICD-10-CM

## 2015-05-15 DIAGNOSIS — R059 Cough, unspecified: Secondary | ICD-10-CM

## 2015-05-15 DIAGNOSIS — R519 Headache, unspecified: Secondary | ICD-10-CM

## 2015-05-15 DIAGNOSIS — R51 Headache: Secondary | ICD-10-CM

## 2015-05-15 DIAGNOSIS — M26629 Arthralgia of temporomandibular joint, unspecified side: Secondary | ICD-10-CM

## 2015-05-15 DIAGNOSIS — M2662 Arthralgia of temporomandibular joint: Secondary | ICD-10-CM

## 2015-05-15 MED ORDER — AMOXICILLIN 875 MG PO TABS
875.0000 mg | ORAL_TABLET | Freq: Two times a day (BID) | ORAL | Status: AC
Start: 2015-05-15 — End: 2015-05-25

## 2015-05-15 MED ORDER — KETOROLAC TROMETHAMINE 60 MG/2ML IM SOLN
60.0000 mg | Freq: Once | INTRAMUSCULAR | Status: AC
Start: 2015-05-15 — End: 2015-05-15
  Administered 2015-05-15: 60 mg via INTRAMUSCULAR

## 2015-05-15 NOTE — Patient Instructions (Addendum)
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 This term describes a group of problems related to the temporomandibular joint (TMJ) and nearby muscles. The TMJ is located where the upper and lower jaws meet. Treatment will get your jaw back to normal function. But your care doesn't end there. Once you've had TMD, it's important to avoid reinjury. Get in the habit of doing self-checks. This can make you aware of any symptoms that begin to recur, so you can take action right away.    Doing Self-Checks  Make it a habit to assess your body a few times each day. Try writing yourself a reminder. Or set an alarm on your watch or computer. When doing a self-check, ask yourself:   Do I feel stressed?   Are my muscles tense?   Am I grinding or clenching my teeth?   Is my posture healthy for my body?   Is there anything I can do to make myself more comfortable?  If you answer "yes" to any of the questions above, you need to take action. Adjusting your posture or taking a short break can help prevent or relieve TMD symptoms.  Listening to Your Body  Many people get used to ignoring pain. But pain is a signal that your body needs care. To maintain your TMJ health:   Avoid hard or chewy foods. Even if you feel fine, eating such foods can triggers symptoms again.   Be aware of your body. Don't ignore TMD symptoms. The nagging pain in your neck or jaw may indicate that you need care.   Be sure to keep follow-up appointments with your healthcare team.  Managing Stress  Stress is a key factor in TMD. Stress can cause you to clench your muscles or grind your teeth. It can also affect your sleep, reducing your body's ability to heal. Here are a few tips to manage stress:   Learn ways to relax. Try listening to music or gently stretching. Take a few slow deep breaths. Or, close your eyes and imagine a place or object that is calming.   Get plenty  of rest and sleep.   Set goals you know you can attain.   Make time for people and things you enjoy.   Ask for help if you need it. Friends and family can run errands and cook meals for you. They can also join you for walks or other types of exercise.  Staying Active  Activity helps the body in many ways. You stay looser and more relaxed. It also helps keep muscles and tissues conditioned. That way you can heal faster and make reinjury less likely. Here are some tips to get you started:   Talk to your healthcare provider before starting an exercise program.   Always warm up and stretch before each activity. This helps prevent injury.   Try walking or swimming. These activities are easy on your joints. They also benefit your heart and lungs.   Try yoga or tai chi. These are relaxing activities known for reducing stress.   [address] [employer] [address], [place], [place] [address]. All rights reserved. This information is not intended as a substitute for professional medical care. Always follow your healthcare professional's instructions.        Self-Care for Headaches  Most headaches aren't serious and can be relieved with self-care. But some headaches may be a sign of another health problem like eye trouble or high blood pressure. To  find the best treatment, learn what kind of headaches you get. For tension headaches, self-care will usually help. To treat migraines, ask your doctor for advice. It is also possible to get both tension and migraine headaches. Self-care involves relieving the pain and avoiding headache "triggers" if you can.    Ways to Reduce Pain and Tension  Try these steps:   Apply a cold compress or ice pack to the pain site.   Drink fluids. If nausea makes it hard to drink, try sucking on ice.   Rest. Protect yourself from bright light and loud noises.   Calm your emotions by imagining a peaceful scene.   Massage tight neck, shoulder, and head muscles.   To relax  muscles, soak in a hot bath or use a hot shower.  Use Medications  Aspirin or aspirin substitutes, such as ibuprofen and acetaminophen, can relieve headache. Remember: Never give aspirin to anyone 31 years old or younger.Use pain medications only when necessary.  Track Your Headaches  Keeping a headache diary can help you and your doctor identify what's causing your headaches:   Note when each headache occurs.   Identify your activities and the foods you've eaten6 to 8hours before the headache began.   Look for any trends or "triggers."  Signs of Tension Headache  Any of the following can be signs:   Dull pain or feeling of pressure in a tight band around your head   Pain in your neck or shoulders   Headache without a definite beginning or end   Headache after an activity such as driving or working on a computer  Signs of Migraine  Any of the following can be signs:   Throbbing pain on one or both sides of your head   Nausea or vomiting   Extreme sensitivity to light, sound, and smells   Bright spots, flashes, or other visual changes   Pain or nausea so severe that you can't continue your daily activities     2000-2015 [employer] [address] [place], [place] [address]. All rights reserved. This information is not intended as a substitute for professional medical care. Always follow your healthcare professional's instructions.        Viral Respiratory Illness [Adult]  You have an Upper Respiratory Illness (URI) caused by a virus. This illness is contagious during the first few days. It is spread through the air by coughing and sneezing or by direct contact (touching the sick person and then touching your own eyes, nose or mouth). Most viral illnesses go away within 7-10 days with rest and simple home remedies. Sometimes, the illness may last for several weeks. Antibiotics will not kill a virus and are generally not prescribed for this condition.    Home Care:  1) If symptoms are  severe, rest at home for the first 2-3 days. When you resume activity, don't let yourself get too tired.  2) Avoid being exposed to cigarette smoke (yours or others').  3) Tylenol (acetaminophen) or ibuprofen (Advil, Motrin) will help fever, muscle aching and headache. (Persons under 18 with fever should not take aspirin since this may cause liver damage.)  4) Your appetite may be poor, so a light diet is fine. Avoid dehydration by drinking 6-8 glasses of fluids per day (water, soft drinks, juices, tea, soup). Extra fluids will help loosen secretions in the nose and lungs.  5) Over-the-counter cold medicines will not shorten the length of time you're sick, but they may be  helpful for the following symptoms: cough (Robitussin DM); sore throat (Chloraseptic lozenges or spray); nasal and sinus congestion (Actifed, Sudafed, Chlortrimeton).  Follow Up  with your doctor or as advised if you don't improve over the next week.  Get Prompt Medical Attention  if any of the following occur:  -- Cough with lots of colored sputum (mucus) or blood in your sputum  -- Chest pain, shortness of breath, wheezing or have trouble breathing  -- Severe headache; face, neck or ear pain  -- Fever over 100.4 F (38.0 C) for more than three days  -- You can't swallow due to throat pain   2000-2015 [employer]. [address], [place], [place] [address]. All rights reserved. This information is not intended as a substitute for professional medical care. Always follow your healthcare professional's instructions.

## 2015-05-15 NOTE — Progress Notes (Addendum)
Subjective:      Patient called today requesting tessalon perles for her 'horrific' coughing. Rx sent to [facility], no refills.  [name], MD       Patient ID: Monica Turner is a 54 y.o. female.    HPI    54/F presents with a sinus headache.  She says it's on her forehead, deep inside her eyes, left cheek, and left jaw.  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  She has been finding herself clenching her jaw when she's tense.  She takes Singulair for allergies and says her allergies are mild on it.  She's had some postnasal drip the last 2 days.  She had a ST initially but today not so bad.  She hasn't coughed up sputum.        Review of Systems  No f/c  No n/v  No CP  No SOB  No abdl pain  No dizziness  No rash    Past medical history, family history, social history, current medications, and drug allergies reviewed.        Objective:     Physical Exam  BP 106/70 mmHg  Pulse 84  Temp(Src) 97.2 F (36.2 C) (Tympanic)  Resp 20  Ht 1.676 m (5\' 6" )  Wt 74.39 kg (164 lb)  BMI 26.48 kg/m2  VS reviewed.  General:  Well-developed, well-nourished, not in acute respiratory distress  Head:  Normocephalic, atraumatic  Eyes:  PERLA, pink conjunctivae, anicteric sclerae, no discharge  Ears:  External ear canals clear, tympanic membranes clear  Nose:  External nose normal, mucosa inflamed but not congested  Throat:  Tonsils nonenlarged and nonerythematous, no exudates, uvula normal  Neck:  Trachea midline, thyroid normal  Lungs:  Clear breath sounds, no rales, no wheezes, no rhonchi  Heart:  Rate normal, regular rhythm, no murmur appreciated  Lymphatics:  No cervical lymphadenopathy  Psych:  Normal mood, appropriate affect  Neurologic:  Alert, oriented to time/place/person  Skin:  Warm, dry, good turgor, no rash            Assessment:       1. Acute nonintractable headache, unspecified headache type  ketorolac (TORADOL) injection 60 mg   2. Upper respiratory tract infection, unspecified type      Try OTC Sudafed  Give a few days for mucus to break up  Take amox only if persistently purulent and worsening drainage. amoxicillin (AMOXIL) 875 MG tablet   3. TMJ arthralgia   Resume use of night guard  Jaw exercises  Relaxation techniques ketorolac (TORADOL) injection 60 mg            Plan:       As above

## 2015-05-16 MED ORDER — BENZONATATE 100 MG PO CAPS
100.0000 mg | ORAL_CAPSULE | Freq: Three times a day (TID) | ORAL | Status: DC | PRN
Start: 2015-05-16 — End: 2015-08-02

## 2015-05-16 NOTE — Addendum Note (Signed)
Addended by: Konrad Felix on: 05/16/2015 02:15 PM     Modules accepted: Orders

## 2015-06-04 ENCOUNTER — Emergency Department (HOSPITAL_COMMUNITY)
Admission: EM | Admit: 2015-06-04 | Discharge: 2015-06-04 | Disposition: A | Discharge status: Home / Self Care | Attending: Emergency Medicine | Admitting: Emergency Medicine

## 2015-06-04 ENCOUNTER — Emergency Department (HOSPITAL_COMMUNITY)

## 2015-06-04 ENCOUNTER — Encounter (HOSPITAL_COMMUNITY): Payer: Self-pay | Admitting: Emergency Medicine

## 2015-06-04 DIAGNOSIS — Y998 Other external cause status: Secondary | ICD-10-CM | POA: Insufficient documentation

## 2015-06-04 DIAGNOSIS — Y9389 Activity, other specified: Secondary | ICD-10-CM | POA: Diagnosis not present

## 2015-06-04 DIAGNOSIS — S99922A Unspecified injury of left foot, initial encounter: Secondary | ICD-10-CM | POA: Diagnosis present

## 2015-06-04 DIAGNOSIS — X58XXXA Exposure to other specified factors, initial encounter: Secondary | ICD-10-CM | POA: Diagnosis not present

## 2015-06-04 DIAGNOSIS — Z79899 Other long term (current) drug therapy: Secondary | ICD-10-CM | POA: Insufficient documentation

## 2015-06-04 DIAGNOSIS — J45909 Unspecified asthma, uncomplicated: Secondary | ICD-10-CM | POA: Insufficient documentation

## 2015-06-04 DIAGNOSIS — Y9289 Other specified places as the place of occurrence of the external cause: Secondary | ICD-10-CM | POA: Insufficient documentation

## 2015-06-04 DIAGNOSIS — M79672 Pain in left foot: Secondary | ICD-10-CM

## 2015-06-04 DIAGNOSIS — Z87891 Personal history of nicotine dependence: Secondary | ICD-10-CM | POA: Diagnosis not present

## 2015-06-04 HISTORY — DX: Unspecified asthma, uncomplicated: J45.909

## 2015-06-04 MED ORDER — IBUPROFEN 800 MG PO TABS
800.0000 mg | ORAL_TABLET | Freq: Once | ORAL | Status: AC
  Administered 2015-06-04: 800 mg via ORAL
  Filled 2015-06-04: qty 1

## 2015-06-04 MED ORDER — IBUPROFEN 800 MG PO TABS: 800.0000 mg | ORAL_TABLET | Freq: Three times a day (TID) | ORAL | Status: AC

## 2015-06-04 NOTE — ED Provider Notes (Signed)
CSN: [id number]     Arrival date & time 06/04/15  0526 History   First MD Initiated Contact with Patient 06/04/15 [phone]     Chief Complaint  Patient presents with  . Foot Injury     (Consider location/radiation/quality/duration/timing/severity/associated sxs/prior Treatment) The history is provided by the patient and medical records. No language interpreter was used.     Brandi Cobb is a 54 y.o. female  with a hx of asthma presents to the Emergency Department complaining of sudden, persistent, pain in the left foot last night after tripping over a hand weight.  She reports associated swelling to the 4th and 5th toes with bruising.  No treatments prior to arrival. She reports the swelling, bruising and pain was worse this morning after awaking. She reports she is able to walk but with pain.  Nothing seems to make the symptoms better. No weakness, numbness, lacerations. Patient does not take a blood thinner.   Past Medical History  Diagnosis Date  . Asthma    Past Surgical History  Procedure Laterality Date  . Cosmetic surgery    . Septoplasty    . Novasure ablation    . Bladder surgery     Family History  Problem Relation Age of Onset  . CAD Mother   . CAD Father    Social History  Substance Use Topics  . Smoking status: Former Games developer  . Smokeless tobacco: None  . Alcohol Use: Yes     Comment: rare   OB History    No data available     Review of Systems  Constitutional: Negative for fever and chills.  Gastrointestinal: Negative for nausea and vomiting.  Musculoskeletal: Positive for joint swelling and arthralgias. Negative for back pain, neck pain and neck stiffness.  Skin: Negative for wound.  Neurological: Negative for numbness.  Hematological: Does not bruise/bleed easily.  Psychiatric/Behavioral: The patient is not nervous/anxious.   All other systems reviewed and are negative.     Allergies  Review of patient's allergies indicates no known  allergies.  Home Medications   Prior to Admission medications   Medication Sig Start Date End Date Taking? Authorizing Provider  acidophilus (RISAQUAD) CAPS capsule Take 1 capsule by mouth daily.   Yes Historical Provider, MD  buPROPion (WELLBUTRIN XL) 150 MG 24 hr tablet Take 150 mg by mouth daily.   Yes Historical Provider, MD  cholecalciferol (VITAMIN D) 1000 UNITS tablet Take 1,000 Units by mouth daily.   Yes Historical Provider, MD  clonazePAM (KLONOPIN) 0.5 MG tablet Take 0.5 mg by mouth daily.   Yes Historical Provider, MD  DULoxetine (CYMBALTA) 20 MG capsule Take 40 mg by mouth daily.   Yes Historical Provider, MD  esomeprazole (NEXIUM) 40 MG capsule Take 40 mg by mouth daily at 12 noon.   Yes Historical Provider, MD  estradiol-norethindrone [facility]) 0.05-0.14 MG/DAY Place 1 patch onto the skin 2 (two) times a week.   Yes Historical Provider, MD  L-Methylfolate (DEPLIN PO) Take 1 tablet by mouth daily.   Yes Historical Provider, MD  montelukast (SINGULAIR) 10 MG tablet Take 10 mg by mouth at bedtime.   Yes Historical Provider, MD  Multiple Vitamin (MULTIVITAMIN WITH MINERALS) TABS tablet Take 1 tablet by mouth daily.   Yes Historical Provider, MD  ibuprofen (ADVIL,MOTRIN) 800 MG tablet Take 1 tablet (800 mg total) by mouth 3 (three) times daily. With food 06/04/15   Dahlia Client Jobeth Pangilinan, PA-C   BP 105/71 mmHg  Pulse 78  Temp(Src) 98.3 F (36.8  C) (Oral)  Resp 20  SpO2 96% Physical Exam  Constitutional: She appears well-developed and well-nourished. No distress.  HENT:  Head: Normocephalic and atraumatic.  Eyes: Conjunctivae are normal.  Neck: Normal range of motion.  Cardiovascular: Normal rate, regular rhythm and intact distal pulses.   Capillary refill < 3 sec  Pulmonary/Chest: Effort normal and breath sounds normal.  Musculoskeletal: She exhibits tenderness. She exhibits no edema.  ROM: Full range of motion of the left knee, ankle and all toes Swelling and ecchymosis  noted to the left fourth and fifth toe with tenderness to palpation; no palpable deformity No pain to palpation of the dorsum or lateral portion of the foot  Neurological: She is alert. Coordination normal.  Sensation intact to dull and sharp throughout the left lower extremity Strength 5/5 including dorsiflexion and plantar flexion of the ankle and great toe  Skin: Skin is warm and dry. She is not diaphoretic.  No tenting of the skin  Psychiatric: She has a normal mood and affect.  Nursing note and vitals reviewed.   ED Course  Procedures (including critical care time) Labs Review Labs Reviewed - No data to display  Imaging Review Dg Foot Complete Left  06/04/2015   CLINICAL DATA:  Left foot pain and swelling after injury. Tripped over a weight last evening. Pain across metatarsals and third and fourth toes.  EXAM: LEFT FOOT - COMPLETE 3+ VIEW  COMPARISON:  None.  FINDINGS: No fracture or dislocation. The alignment and joint spaces are maintained. Particularly, the metatarsals, third and fourth toes are intact. Soft tissue edema noted about the fourth toe.  IMPRESSION: No fracture or subluxation of the foot.   Electronically Signed   By: Rubye Oaks M.D.   On: 06/04/2015 06:46   I have personally reviewed and evaluated these images and lab results as part of my medical decision-making.   EKG Interpretation None      MDM   Final diagnoses:  Left foot pain   Brandi Cobb presents with pain, swelling and ecchymosis to the left fourth and fifth toe after tripping over a hand weight. Patient is able to ambulate with pain. No treatment prior to arrival. Pain controlled here in the emergency department. Ice applied. X-rays without evidence of fracture. Will give postop shoe for comfort.  Pt is to follow-up with ortho as needed when she returns home.    BP 105/71 mmHg  Pulse 78  Temp(Src) 98.3 F (36.8 C) (Oral)  Resp 20  SpO2 96%   Dierdre Forth, PA-C 06/04/15  1610  Devoria Albe, MD 06/04/15 [phone]

## 2015-06-04 NOTE — ED Notes (Signed)
Pt states she tripped over a hand weight last night and heard a crunch in her left foot   Pt states the pain is worse this morning  Pt has swelling noted to the 4th and 5th toe with bruising noted  Pt states it hurts to walk

## 2015-06-04 NOTE — Discharge Instructions (Signed)
1. Medications: ibuprofen, usual home medications 2. Treatment: rest, drink plenty of fluids, ice elevate 3. Follow Up: Please followup with your orthopedist as needed days for discussion of your diagnoses and further evaluation after today's visit;

## 2015-06-11 ENCOUNTER — Other Ambulatory Visit (INDEPENDENT_AMBULATORY_CARE_PROVIDER_SITE_OTHER): Payer: Self-pay

## 2015-06-11 NOTE — Telephone Encounter (Signed)
Pt is calling for refill on Clonazepam..Printed PMP which show she is getting this from multiple doctors.  You last gave 30 tabs on 11/04/14.  April thru August she has gotten it from 2 other docs.  Last fill was 05/16/15 from Dr Charlesetta Ivory for 20 tabs.    Pt states she is having increased anxiety.    Her LOV with you was 11/27/14.  No pending appts.    PMP on my desk.

## 2015-06-12 NOTE — Telephone Encounter (Signed)
I pulled it also and I cant fill her controlled substance, she is seeing a psychiatrist (Dr Conni Elliot) who has been filling it and it appears they were decreasing the # of pills they were giving her, she will need to continue with them or have an appointment and have the notes faxed over that say what the psychiatrists plan was.  Sry [facility]

## 2015-06-13 ENCOUNTER — Encounter (INDEPENDENT_AMBULATORY_CARE_PROVIDER_SITE_OTHER): Payer: Self-pay | Admitting: Family

## 2015-06-13 ENCOUNTER — Ambulatory Visit (INDEPENDENT_AMBULATORY_CARE_PROVIDER_SITE_OTHER): Admitting: Family

## 2015-06-13 VITALS — BP 101/69 | HR 72 | Temp 98.2°F | Resp 16 | Wt 167.0 lb

## 2015-06-13 DIAGNOSIS — F419 Anxiety disorder, unspecified: Secondary | ICD-10-CM

## 2015-06-13 DIAGNOSIS — M26629 Arthralgia of temporomandibular joint, unspecified side: Secondary | ICD-10-CM

## 2015-06-13 DIAGNOSIS — M2662 Arthralgia of temporomandibular joint: Secondary | ICD-10-CM

## 2015-06-13 MED ORDER — METHYLPREDNISOLONE 4 MG PO TBPK
ORAL_TABLET | ORAL | Status: DC
Start: 2015-06-13 — End: 2015-08-02

## 2015-06-13 MED ORDER — NAPROXEN 500 MG PO TABS
500.0000 mg | ORAL_TABLET | Freq: Two times a day (BID) | ORAL | Status: DC
Start: 2015-06-13 — End: 2015-08-02

## 2015-06-13 NOTE — Progress Notes (Signed)
[facility]    PROGRESS NOTE      Patient: Monica Turner [id number]   Date: 06/13/2015   MRN: [id number]     Past Medical History   Diagnosis Date   . Other plastic surgery for unacceptable cosmetic appearance    . Asthma without status asthmaticus    . Headache(784.0)      Sinus   . Gastroesophageal reflux disease    . Irritable bowel syndrome    . Turner back pain    . Arthritis      Neck Right Hand   . Claustrophobia    . Depression    . Seasonal allergies    . Sciatica of left side    . Smokes with greater than 40 pack year history      Quit 2 weks ago.   . Sleep apnea      Social History     Social History   . Marital Status: Married     Spouse Name: N/A   . Number of Children: N/A   . Years of Education: N/A     Occupational History   . Not on file.     Social History Main Topics   . Smoking status: Former Smoker -- 1.00 packs/day for 40 years     Quit date: 12/17/2014   . Smokeless tobacco: Not on file   . Alcohol Use: Yes      Comment: 2 X Month 2-4 Beers   . Drug Use: No   . Sexual Activity: Not on file     Other Topics Concern   . Not on file     Social History Narrative     Family History   Problem Relation Age of Onset   . Anesthesia problems Sister        ASSESSMENT/PLAN     Monica Turner is a 54 y.o. female    Chief Complaint   Patient presents with   . Anxiety     and muscle sorness in neck        1. TMJ arthralgia  - methylPREDNISolone (MEDROL DOSPACK) 4 MG tablet; follow package directions  Dispense: 21 tablet; Refill: 0  - naproxen (NAPROSYN) 500 MG tablet; Take 1 tablet (500 mg total) by mouth 2 (two) times daily with meals.  Dispense: 40 tablet; Refill: 0    2. Anxiety    Will try medrol pack and NSAIDs to help reduce TMJ and neck pain. No muscle relaxants today as they make pt drowsy, especially when combined with antianxiety medications. Pt will f/u with dentist as planned for new mouthguard.    Pt's psychiatrist called pt back right before office visit and will be refilling pt's  antianxiety medications today. Pt will f/u with psychiatrist on 06/22/15.     Risk & Benefits of the new medication(s) were explained to the patient (and family) who verbalized understanding & agreed to the treatment plan. Patient (family) encouraged to contact me/clinical staff with any questions/concerns      MEDICATIONS     Current Outpatient Prescriptions   Medication Sig Dispense Refill   . buPROPion XL (WELLBUTRIN XL) 150 MG 24 hr tablet TAKE ONE TABLET BY MOUTH EVERY DAY  1   . Cholecalciferol (VITAMIN D-3 PO) Take 1 tablet by mouth daily.     . DULoxetine (CYMBALTA) 60 MG capsule      . esomeprazole (NEXIUM) 40 MG capsule Take 1 capsule (40 mg total) by mouth every morning before breakfast.  30 capsule 0   . estradiol-norethindrone (COMBIPATCH) 0.05-0.14 MG/DAY Place 1 patch onto the skin twice a week. 24 patch 1   . montelukast (SINGULAIR) 10 MG tablet Take 1 tablet (10 mg total) by mouth nightly. 30 tablet 0   . Multiple Vitamin (MULTIVITAMIN) tablet Take 1 tablet by mouth daily.     . Probiotic Product (PROBIOTIC DAILY PO) Take 2 tablets by mouth daily.     . benzonatate (TESSALON PERLES) 100 MG capsule Take 1 capsule (100 mg total) by mouth 3 (three) times daily as needed for Cough. 20 capsule 0   . benzonatate (TESSALON PERLES) 100 MG capsule Take 1 capsule (100 mg total) by mouth 3 (three) times daily as needed for Cough. 30 capsule 0   . clonazePAM (KLONOPIN) 0.5 MG tablet 0.5 mg 2 (two) times daily as needed.        Marland Kitchen PROAIR HFA 108 (90 BASE) MCG/ACT inhaler      . Sodium Chloride-Sodium Bicarb (NETI POT SINUS WASH) 2300-700 MG Kit 1 packet by Nasal route daily. 30 each 0     No current facility-administered medications for this visit.     Facility-Administered Medications Ordered in Other Visits   Medication Dose Route Frequency Provider Last Rate Last Dose   . ceFAZolin (ANCEF) 1 g in sodium chloride 0.9 % 50 mL IVPB mini-bag plus  1 g Intravenous [facility] Katheren Puller, MD   1 g at 02/06/14  5638   . oxyCODONE-acetaminophen (PERCOCET) 5-325 MG per tablet 1 tablet  1 tablet Oral Q4H PRN Katheren Puller, MD           No Known Allergies    SUBJECTIVE     Chief Complaint   Patient presents with   . Anxiety     and muscle sorness in neck        Anxiety  Presents for follow-up visit. Episode onset: Pt stated that from last week it is worsening. The problem has been gradually worsening. Symptoms include depressed mood, excessive worry, insomnia, irritability and nervous/anxious behavior. Patient reports no chest pain, dizziness, nausea, palpitations or shortness of breath. Primary symptoms comment:  Neck and jaw pain- started 6 months ago and is worsening. . Symptoms occur constantly. The severity of symptoms is moderate. The quality of sleep is good. Nighttime awakenings: occasional.     Her past medical history is significant for anxiety/panic attacks and depression. Treatments tried: advil and prescription meds.     Pt also complaining of TMJ pain today. Pt with Hx of pain and had been wearing a mouthguard. Pt states she recently lost mouthguard and pain is returning, radiates from TMJ around to back of neck. Pt cannot get new mouthguard for a few weeks. Took advil this morning for pain.     ROS     Review of Systems   Constitutional: Positive for irritability. Negative for fever, chills and fatigue.   Respiratory: Negative for cough and shortness of breath.    Cardiovascular: Negative for chest pain and palpitations.   Gastrointestinal: Negative for nausea, vomiting and diarrhea.   Musculoskeletal:        TMJ and neck pain   Skin: Negative for rash.   Neurological: Negative for dizziness and headaches.   Psychiatric/Behavioral: The patient is nervous/anxious and has insomnia.        The following portions of the patient's history were reviewed and updated as appropriate: Allergies, Current Medications, Past Family History, Past Medical history, Past social history,  Past surgical history, and Problem  List.    PHYSICAL EXAM     Filed Vitals:    06/13/15 1356   BP: 101/69   Pulse: 72   Temp: 98.2 F (36.8 C)   TempSrc: Oral   Resp: 16   Weight: 75.751 kg (167 lb)   SpO2: 98%       Physical Exam   Constitutional: She is oriented to person, place, and time. Vital signs are normal. She appears well-developed and well-nourished.   HENT:   Head: Normocephalic and atraumatic.   TMJ clicking on palpation BL, nontender to palpation and no edema or erythema   Neck: Normal range of motion. Neck supple.   Muscle tenderness to cervical area, otherwise normal   Neurological: She is alert and oriented to person, place, and time.   Skin: Skin is warm, dry and intact.   Psychiatric: Her speech is normal and behavior is normal. Her mood appears anxious.     Alfonso Ellis, FNP  06/13/2015

## 2015-07-09 ENCOUNTER — Ambulatory Visit (INDEPENDENT_AMBULATORY_CARE_PROVIDER_SITE_OTHER): Admitting: Cardiovascular Disease

## 2015-07-09 ENCOUNTER — Encounter (INDEPENDENT_AMBULATORY_CARE_PROVIDER_SITE_OTHER): Payer: Self-pay | Admitting: Cardiovascular Disease

## 2015-07-09 VITALS — BP 104/75 | HR 80 | Temp 98.0°F | Ht 66.0 in | Wt 168.0 lb

## 2015-07-09 DIAGNOSIS — Z79890 Hormone replacement therapy (postmenopausal): Secondary | ICD-10-CM

## 2015-07-09 DIAGNOSIS — F411 Generalized anxiety disorder: Secondary | ICD-10-CM

## 2015-07-09 DIAGNOSIS — N83209 Unspecified ovarian cyst, unspecified side: Secondary | ICD-10-CM

## 2015-07-09 DIAGNOSIS — J302 Other seasonal allergic rhinitis: Secondary | ICD-10-CM

## 2015-07-09 DIAGNOSIS — N8320 Unspecified ovarian cysts: Secondary | ICD-10-CM

## 2015-07-09 DIAGNOSIS — K219 Gastro-esophageal reflux disease without esophagitis: Secondary | ICD-10-CM

## 2015-07-09 DIAGNOSIS — E559 Vitamin D deficiency, unspecified: Secondary | ICD-10-CM

## 2015-07-09 DIAGNOSIS — F329 Major depressive disorder, single episode, unspecified: Secondary | ICD-10-CM

## 2015-07-09 DIAGNOSIS — D259 Leiomyoma of uterus, unspecified: Secondary | ICD-10-CM

## 2015-07-09 DIAGNOSIS — J4520 Mild intermittent asthma, uncomplicated: Secondary | ICD-10-CM

## 2015-07-09 DIAGNOSIS — F32A Depression, unspecified: Secondary | ICD-10-CM

## 2015-07-09 MED ORDER — CLONAZEPAM 0.5 MG PO TABS
0.5000 mg | ORAL_TABLET | Freq: Two times a day (BID) | ORAL | Status: DC | PRN
Start: 2015-07-09 — End: 2015-08-12

## 2015-07-09 NOTE — Progress Notes (Signed)
Subjective:      Date: 07/09/2015 7:19 PM   Patient ID: [facility] is a 54 y.o. female.    Chief Complaint:  Chief Complaint   Patient presents with   . Anxiety     RX refill       HPI: Pt is here for a refill on her anxiety med, pt is taking Klonopin.   HPI    Problem List:  Patient Active Problem List   Diagnosis   . H/O cosmetic surgery   . H/O cosmetic plastic surgery       Current Medications:  Current Outpatient Prescriptions   Medication Sig Dispense Refill   . buPROPion XL (WELLBUTRIN XL) 150 MG 24 hr tablet TAKE ONE TABLET BY MOUTH EVERY DAY  1   . Cholecalciferol (VITAMIN D-3 PO) Take 1 tablet by mouth daily.     . clonazePAM (KLONOPIN) 0.5 MG tablet Take 1 tablet (0.5 mg total) by mouth 2 (two) times daily as needed. 45 tablet 1   . DULoxetine (CYMBALTA) 60 MG capsule      . esomeprazole (NEXIUM) 40 MG capsule Take 1 capsule (40 mg total) by mouth every morning before breakfast. 30 capsule 0   . estradiol-norethindrone (COMBIPATCH) 0.05-0.14 MG/DAY Place 1 patch onto the skin twice a week. 24 patch 1   . montelukast (SINGULAIR) 10 MG tablet Take 1 tablet (10 mg total) by mouth nightly. 30 tablet 0   . Multiple Vitamin (MULTIVITAMIN) tablet Take 1 tablet by mouth daily.     . naproxen (NAPROSYN) 500 MG tablet Take 1 tablet (500 mg total) by mouth 2 (two) times daily with meals. 40 tablet 0   . Probiotic Product (PROBIOTIC DAILY PO) Take 2 tablets by mouth daily.     . benzonatate (TESSALON PERLES) 100 MG capsule Take 1 capsule (100 mg total) by mouth 3 (three) times daily as needed for Cough. 20 capsule 0   . benzonatate (TESSALON PERLES) 100 MG capsule Take 1 capsule (100 mg total) by mouth 3 (three) times daily as needed for Cough. 30 capsule 0   . methylPREDNISolone (MEDROL DOSPACK) 4 MG tablet follow package directions 21 tablet 0   . PROAIR HFA 108 (90 BASE) MCG/ACT inhaler      . Sodium Chloride-Sodium Bicarb (NETI POT SINUS WASH) 2300-700 MG Kit 1 packet by Nasal route daily. 30 each 0      No current facility-administered medications for this visit.     Facility-Administered Medications Ordered in Other Visits   Medication Dose Route Frequency Provider Last Rate Last Dose   . ceFAZolin (ANCEF) 1 g in sodium chloride 0.9 % 50 mL IVPB mini-bag plus  1 g Intravenous [facility] Katheren Puller, MD   1 g at 02/06/14 1610   . oxyCODONE-acetaminophen (PERCOCET) 5-325 MG per tablet 1 tablet  1 tablet Oral Q4H PRN Katheren Puller, MD           Allergies:  No Known Allergies    Past Medical History:  Past Medical History   Diagnosis Date   . Other plastic surgery for unacceptable cosmetic appearance    . Asthma without status asthmaticus    . Headache(784.0)      Sinus   . Gastroesophageal reflux disease    . Irritable bowel syndrome    . Low back pain    . Arthritis      Neck Right Hand   . Claustrophobia    . Depression    .  Seasonal allergies    . Sciatica of left side    . Smokes with greater than 40 pack year history      Quit 2 weks ago.   . Sleep apnea        Past Surgical History:  Past Surgical History   Procedure Laterality Date   . Egd, colonoscopy  2015   . Abdominoplasty, liposuction (cosmetic)  2009   . Liposuction, neck (cosmetic)  2009   . Hysteroscopy, endometrial ablation, thermachoice  2015   . Cystoscopy, ureteroscopy  2015   . Facelift, (rhytidectomy) (cosmetic)  02/06/2014     Procedure: FACELIFT, (RHYTIDECTOMY) (COSMETIC);  Surgeon: Katheren Puller, MD;  Location: [facility] OR;  Service: Plastics;  Laterality: Bilateral;  RHYTIDECTOMY (COSMETIC)        Family History:  Family History   Problem Relation Age of Onset   . Anesthesia problems Sister        Social History:  Social History     Social History   . Marital Status: Married     Spouse Name: N/A   . Number of Children: N/A   . Years of Education: N/A     Occupational History   . Not on file.     Social History Main Topics   . Smoking status: Former Smoker -- 1.00 packs/day for 40 years     Quit date: 12/17/2014    . Smokeless tobacco: Not on file   . Alcohol Use: Yes      Comment: 2 X Month 2-4 Beers   . Drug Use: No   . Sexual Activity: Not on file     Other Topics Concern   . Not on file     Social History Narrative       The following portions of the patient's history were reviewed and updated as appropriate: allergies, current medications, past family history, past medical history, past social history, past surgical history and problem list.    Vitals:  BP 104/75 mmHg  Pulse 80  Temp(Src) 98 F (36.7 C) (Oral)  Ht 1.676 m (5\' 6" )  Wt 76.204 kg (168 lb)  BMI 27.13 kg/m2  SpO2 98%     ROS:  Constitutional: Negative for fever, chills, weight loss/gain.   HENT: Negative for nasal congestion, nasal discharge, ear congestion.    Eyes: Negative for blurred vision, double vision, photophobia.  Respiratory: Negative for cough, sputum production. Occasional shortness of breath during exercise.    Cardiovascular: Negative for chest pain, palpitations.  Gastrointestinal: Negative for abdominal pain, vomiting, melena, hematochezia. Positive for  mild intermittent nausea due to GERD.  Genitourinary: Negative for dysuria, urgency.  Positive for occasional increased frequency with a hx of a bladder lift with mesh repair   Musculoskeletal: Negative for myalgias, back pain, joint pain, neck pain, falls.  Skin: Negative for itching, rash, urticaria.   Neurological: Negative for dizziness, lightheadedness, loss of consciousness.  Endo/Heme/Allergies: Positive for seasonal allergies.   Psychiatric/Behavioral: Positive for stable depression, anxiety.  Negative for insomnia.        Objective:       Physical Exam:  Constitutional: Well-developed, well-nourished. In no acute distress.   Head: Atraumatic. Normocephalic.  Right Ear: Tympanic membrane normal. External canal normal. No pain with movement of pinna.  Left Ear: Tympanic membrane normal.  External canal normal. No pain with movement of pinna.  Nose: Normal septum, normal nasal  mucosa. No exudate.  No masses or polyps.  Mouth/Throat: Mucous membranes are moist.  Normal buccal mucosa. Dentition is in fair repair. Oropharynx is clear.   Eyes: Pupils are equal, round, and reactive to light. Extra ocular muscles are intact.  Normal conjunctivae.  Neck: Normal range of motion. Neck supple. No carotid bruits.  No thyroid masses. No cervical lymphadenopathy.  Cardiovascular: Normal rate.  Regular rhythm, no S3, normal S1, normal S2, no S4. No murmur heard.  No rubs, thrills, or lifts.   Pulmonary/Chest: Breath sounds normal. No wheezing, rhonchi, crackles, cough.   Abdominal: Bowel sounds are normal. Soft, non-tender, non-distended. No palpable masses. There is no rebound, rigidity, or guarding.   Musculoskeletal: Normal range of motion. No rigidity.   Neurological: Alert. Awake.  Oriented to person, place, and time..  Normal gait.  Extremities: Normal distal pulses.  No edema.  No clubbing.  No cyanosis.  Skin: Skin is warm and moist. No exanthem.      Assessment/Plan:       1. Generalized anxiety disorder  - clonazePAM (KLONOPIN) 0.5 MG tablet; Take 1 tablet (0.5 mg total) by mouth 2 (two) times daily as needed.  Dispense: 45 tablet; Refill: 1    2. Gastroesophageal reflux disease without esophagitis    3. Depression, unspecified depression type    4. Hormone replacement therapy (HRT)    5. Vitamin D insufficiency    6. Seasonal allergies    7. Mild intermittent asthma without complication    8. Cyst of ovary, unspecified laterality    9. Uterine leiomyoma, unspecified location        [facility], DO

## 2015-07-17 ENCOUNTER — Encounter (INDEPENDENT_AMBULATORY_CARE_PROVIDER_SITE_OTHER): Payer: Self-pay | Admitting: Cardiovascular Disease

## 2015-07-20 ENCOUNTER — Telehealth (INDEPENDENT_AMBULATORY_CARE_PROVIDER_SITE_OTHER): Payer: Self-pay | Admitting: Cardiovascular Disease

## 2015-07-20 NOTE — Telephone Encounter (Signed)
Patient states that the pharmacy filled a KLONOPIN prescription of 1/2 tablet 1x per day.   Dr Nedra Hai ordered the prescription at 2 tablets per day.   We need to contact the pharmacy and verify this information and correct the prescription.

## 2015-07-21 NOTE — Telephone Encounter (Signed)
Pt called again stating she is very upset and that she didn't know that as her PCP, she could not call for refills from [place] in between Psych appts. Pt says she does not plan to see Psych anymore and want to discuss having you take over these meds for her.

## 2015-07-21 NOTE — Telephone Encounter (Signed)
Dr Nedra Hai,  I looked into this and spoke with the pharmacy. Your office note from 9/29 say you printed a rx for Klonopin 0.5mg  1 tab BID. The pharmacist states you wrote a handwritten script for Clonazepam 0.5mg  1/2 tab qd #45. [place]15 [place] because your quantity didn't match your directions on the handwritten script. See telephone encounter from 9/3, Dr Boneta Lucks made note that he would not refill this as pt requested because she gets it from a psychiatrist. I asked pt about this and she stated she no longer wishes to see the psychiatrist. Pharmacy faxed me a copy of your handwritten rx. Please advise how you want to proceed with the rx. Pt stated she wants it to be 0.5mg  1 tab po once daily. I am faxing the [place] and the copy of your written script to [facility] for your review

## 2015-07-21 NOTE — Telephone Encounter (Signed)
PLease contact Ms. Coble and inform her that I will not dispense her psychiatric medications and I WILL NOT take over her psychiatric care.      If my hand written prescription to the pharmacist states that I wrote the Clonazepam for 0.5 mg/tablet, 1/2 tab PO q daily, then I must have asked the patient how much and how often she was using the medication.  I looked over that note from 07/09/2015, and it looks ODD - the EPIC system may have have problems, ie "freezing," at that time because it doesn't look like I typed out any type of history.   And I ALWAYS record some type of patient history, especially when I don't know the patient and he/she are presenting for a medication refill.  Any history that I was trying to type may not have been saved because of a computer problem.  In any case,  I am assuming that the patient told me that she was only using a 1/2 tablet daily at most and that is why the original number of tablets was #45, which would last her 3 months.    I am not sure why my office note from 07/09/2015 isn't more descriptive in the Subjective area because I always document why a patient presents to the office other than "Medication Refill."      If Dr. Boneta Lucks did not refill her Rx on 03/13/2015, because her psychiatrist is supposed to refill this medication, please inform the patient that she failed to mention this fact to me on 07/09/2015.  I will not refill this Clonazepam prescription and she should follow up with her psychiatrist for this refill.      Thank you very much for taking such care of these types of prescription requests.  I will review my hand written RX when I am in the office on 07/22/2015.  Dr. Nedra Hai does not take kindly to being manipulated by patients who have not been entirely truthful....    Thank you.    Sincerely,  Deeann Saint, DO

## 2015-07-22 ENCOUNTER — Ambulatory Visit (INDEPENDENT_AMBULATORY_CARE_PROVIDER_SITE_OTHER): Admitting: Cardiovascular Disease

## 2015-07-22 NOTE — Telephone Encounter (Signed)
Dr.Lee  I noticed that patient is on your schedule for today at 1pm. Did you want to discuss with her at the appointment? I can call her prior if you would prefer.

## 2015-07-22 NOTE — Telephone Encounter (Signed)
Hi Monica Turner,    Thank you for the note. Monica Turner ended up not showing up for her appointment this afternoon.  I had decided that I would simply inform her that I would not refill the medications that are supposed to be refilled by her psychiatrist.  If she is not happy with her current psychiatrist, she can always look for another psychiatrist.     Thanks again.    Sincerely,  Deeann Saint, DO

## 2015-07-23 NOTE — Telephone Encounter (Signed)
Spoke with patient. Advised.

## 2015-07-24 NOTE — Telephone Encounter (Signed)
Pt. Called me to file a complaint regarding what had happened with this encounter. I explained the situation from our point of view and explained that Dr. Boneta Lucks had said she should see her psychiatrist and she said she was never aware of that. I told her that every Dr. Has to use their judgement and recommendations from colleagues to make their decisions, especially with controlled substances. She seemed to understand once I had told her about the recommendation from Dr. Algis Downs and had a better understanding of why Dr. Nedra Hai was not comfortable subscribing the medication. Phone call ended well and patient stated she was actively searching for a new psych.

## 2015-08-02 ENCOUNTER — Emergency Department
Admission: EM | Admit: 2015-08-02 | Discharge: 2015-08-02 | Disposition: A | Discharge status: Home / Self Care | Attending: Emergency Medicine | Admitting: Emergency Medicine

## 2015-08-02 ENCOUNTER — Emergency Department

## 2015-08-02 DIAGNOSIS — R197 Diarrhea, unspecified: Secondary | ICD-10-CM | POA: Insufficient documentation

## 2015-08-02 DIAGNOSIS — J45909 Unspecified asthma, uncomplicated: Secondary | ICD-10-CM | POA: Insufficient documentation

## 2015-08-02 DIAGNOSIS — R1084 Generalized abdominal pain: Secondary | ICD-10-CM | POA: Insufficient documentation

## 2015-08-02 DIAGNOSIS — Z87891 Personal history of nicotine dependence: Secondary | ICD-10-CM | POA: Insufficient documentation

## 2015-08-02 DIAGNOSIS — R109 Unspecified abdominal pain: Secondary | ICD-10-CM

## 2015-08-02 DIAGNOSIS — Z8619 Personal history of other infectious and parasitic diseases: Secondary | ICD-10-CM | POA: Insufficient documentation

## 2015-08-02 LAB — URINE MICROSCOPIC: WBC, UA: NONE SEEN /hpf (ref 0–5)

## 2015-08-02 LAB — CBC AND DIFFERENTIAL
Basophils Absolute Automated: 0.02 10*3/uL (ref 0.00–0.20)
Basophils Automated: 0 %
Eosinophils Absolute Automated: 0.19 10*3/uL (ref 0.00–0.70)
Eosinophils Automated: 3 %
Hematocrit: 42.5 % (ref 37.0–47.0)
Hgb: 14 g/dL (ref 12.0–16.0)
Lymphocytes Absolute Automated: 2.29 10*3/uL (ref 0.50–4.40)
Lymphocytes Automated: 31 %
MCH: 31.4 pg (ref 28.0–32.0)
MCHC: 32.9 g/dL (ref 32.0–36.0)
MCV: 95.3 fL (ref 80.0–100.0)
MPV: 10.1 fL (ref 9.4–12.3)
Monocytes Absolute Automated: 0.51 10*3/uL (ref 0.00–1.20)
Monocytes: 7 %
Neutrophils Absolute: 4.39 10*3/uL (ref 1.80–8.10)
Neutrophils: 59 %
Platelets: 260 10*3/uL (ref 140–400)
RBC: 4.46 10*6/uL (ref 4.20–5.40)
RDW: 13 % (ref 12–15)
WBC: 7.4 10*3/uL (ref 3.50–10.80)

## 2015-08-02 LAB — URINALYSIS
Bilirubin, UA: NEGATIVE
Glucose, UA: NEGATIVE
Ketones UA: NEGATIVE
Leukocyte Esterase, UA: NEGATIVE
Nitrite, UA: NEGATIVE
Protein, UR: NEGATIVE
Specific Gravity UA: 1.005 (ref 1.001–1.035)
Urine pH: 6 (ref 5.0–8.0)
Urobilinogen, UA: 0.2 mg/dL (ref 0.2–2.0)

## 2015-08-02 LAB — COMPREHENSIVE METABOLIC PANEL
ALT: 23 U/L (ref 0–55)
AST (SGOT): 24 U/L (ref 5–34)
Albumin/Globulin Ratio: 1.6 (ref 0.9–2.2)
Albumin: 4.1 g/dL (ref 3.5–5.0)
Alkaline Phosphatase: 56 U/L (ref 37–106)
Anion Gap: 8 (ref 5.0–15.0)
BUN: 14 mg/dL (ref 7.0–19.0)
Bilirubin, Total: 0.3 mg/dL (ref 0.2–1.2)
CO2: 24 mEq/L (ref 22–29)
Calcium: 9.3 mg/dL (ref 8.5–10.5)
Chloride: 106 mEq/L (ref 100–111)
Creatinine: 0.9 mg/dL (ref 0.6–1.0)
Globulin: 2.6 g/dL (ref 2.0–3.6)
Glucose: 96 mg/dL (ref 70–100)
Potassium: 4 mEq/L (ref 3.5–5.1)
Protein, Total: 6.7 g/dL (ref 6.0–8.3)
Sodium: 138 mEq/L (ref 136–145)

## 2015-08-02 LAB — GFR: EGFR: >60

## 2015-08-02 LAB — LIPASE: Lipase: 20 U/L (ref 8–78)

## 2015-08-02 MED ORDER — IOHEXOL 350 MG/ML IV SOLN
100.0000 mL | Freq: Once | INTRAVENOUS | Status: AC
Start: 2015-08-02 — End: 2015-08-02
  Administered 2015-08-02: 100 mL via INTRAVENOUS

## 2015-08-02 MED ORDER — SODIUM CHLORIDE 0.9 % IV BOLUS
1000.0000 mL | Freq: Once | INTRAVENOUS | Status: AC
Start: 2015-08-02 — End: 2015-08-02
  Administered 2015-08-02: 1000 mL via INTRAVENOUS

## 2015-08-02 MED ORDER — LORAZEPAM 2 MG/ML IJ SOLN
1.0000 mg | Freq: Once | INTRAMUSCULAR | Status: AC
Start: 2015-08-02 — End: 2015-08-02
  Administered 2015-08-02: 1 mg via INTRAVENOUS
  Filled 2015-08-02: qty 1

## 2015-08-02 NOTE — ED Notes (Signed)
Pt unable to provide stool sample at this time. Pt stating, "I went all day long and now I just don't feel like I can go". Pt instructed to push call bell if feeling the urge to have a BM.

## 2015-08-02 NOTE — ED Notes (Signed)
C/o abd cramping since 1 month.  Patient states she fears she has c-diff.

## 2015-08-02 NOTE — ED Provider Notes (Signed)
Physician/Midlevel provider first contact with patient: 08/02/15 1540         History     Chief Complaint   Patient presents with   . Diarrhea     HPI   Patient with a history of C. difficile colitis.  States that she has had vague generalized abdominal discomfort and cramping intermittently over the last day or 2 with some very soft stools.  Reminds her of her C. difficile from before.   Pt answers that there are no fevers, chills, vomiting, constipation, trauma or rash.    No chest pain or shortness of breath.  Had antibiotic a few weeks ago for sinusitis.  Stool is nonbloody.             Abdominal pain, diarrhea  Constitutional:  Denies fever or chills   Eyes:  Denies change in visual acuity or eye pain  HENT:  Denies sore throat, trouble swallowing   Respiratory:  Denies cough or shortness of breath or wheeze   Cardiovascular:  Denies chest pain or edema   GI:  Denies nausea, vomiting, bloody stools   GU:  Denies dysuria, hematuria, frequency or trouble urinating   Musculoskeletal:  Denies other joint pain  Integument:  Denies rash  or wound  Neurologic:  Denies headache, focal weakness, numbness, tingling, speech, vision or gait changes  Psychiatric:  Denies suicidal or homicidal ideation.    Mild periumbilical tenderness palpation  Constitutional:  Well developed, well nourished, no acute distress, not ill appearing   Eyes:   conjunctiva normal, no discharge or redness  HENT:  Atraumatic, external ears normal, nose normal, oropharynx moist, no pharyngeal exudates. Neck- normal range of motion, no tenderness, supple   Respiratory:  No respiratory distress, normal breath sounds, no rales, no wheezing, no rhonchi  Cardiovascular:  Normal rate, normal rhythm, no murmurs, no gallops, no rubs, normal radial pulses and dpp   GI:  Soft, nondistended,  no organomegaly, no mass, no rebound, no guarding   GU:  No costovertebral angle tenderness   Musculoskeletal:  No edema, no tenderness, no deformities. Back- no  tenderness  Integument:  Well hydrated, no rash   Lymphatic:  No prominent cervical LAD  Neurologic:  Alert & oriented x 3, normal motor function, no focal deficits noted, coordination normal   Psychiatric:  Speech and behavior appropriate       Diagnosis management comments: I, Ferdinand Cava, MD, have been the primary provider for this patient during this Emergency Dept visit.    Oxygen saturation by pulse oximetry is 95%-100%, Normal, none needed.    DDx:     Viral illness, partial obstruction, C. difficile colitis versus other colitis versus other         Ferdinand Cava upon recheck pt feels well and appears well with a soft and only slightly tender  abdomen.        Patient Progress  Patient progress: stable          Past Medical History   Diagnosis Date   . Other plastic surgery for unacceptable cosmetic appearance    . Asthma without status asthmaticus    . Headache(784.0)      Sinus   . Gastroesophageal reflux disease    . Irritable bowel syndrome    . Low back pain    . Arthritis      Neck Right Hand   . Claustrophobia    . Depression    . Seasonal allergies    .  Sciatica of left side    . Smokes with greater than 40 pack year history      Quit 2 weks ago.   . Sleep apnea        Past Surgical History   Procedure Laterality Date   . Egd, colonoscopy  2015   . Abdominoplasty, liposuction (cosmetic)  2009   . Liposuction, neck (cosmetic)  2009   . Hysteroscopy, endometrial ablation, thermachoice  2015   . Cystoscopy, ureteroscopy  2015   . Facelift, (rhytidectomy) (cosmetic)  02/06/2014     Procedure: FACELIFT, (RHYTIDECTOMY) (COSMETIC);  Surgeon: Katheren Puller, MD;  Location: [facility] OR;  Service: Plastics;  Laterality: Bilateral;  RHYTIDECTOMY (COSMETIC)        Family History   Problem Relation Age of Onset   . Anesthesia problems Sister        Social  Social History   Substance Use Topics   . Smoking status: Former Smoker -- 1.00 packs/day for 40 years     Quit date: 12/17/2014   .  Smokeless tobacco: Not on file   . Alcohol Use: Yes      Comment: 2 X Month 2-4 Beers       .     No Known Allergies    Home Medications     Last Medication Reconciliation Action:  Complete Monica Africa, RN 08/02/2015  3:47 PM                  buPROPion XL (WELLBUTRIN XL) 150 MG 24 hr tablet     TAKE ONE TABLET BY MOUTH EVERY DAY     Cholecalciferol (VITAMIN D-3 PO)     Take 1 tablet by mouth daily.     clonazePAM (KLONOPIN) 0.5 MG tablet     Take 1 tablet (0.5 mg total) by mouth 2 (two) times daily as needed.     DULoxetine (CYMBALTA) 60 MG capsule          estradiol-norethindrone (COMBIPATCH) 0.05-0.14 MG/DAY     Place 1 patch onto the skin twice a week.     montelukast (SINGULAIR) 10 MG tablet     Take 1 tablet (10 mg total) by mouth nightly.     Multiple Vitamin (MULTIVITAMIN) tablet     Take 1 tablet by mouth daily.     PROAIR HFA 108 (90 BASE) MCG/ACT inhaler          Probiotic Product (PROBIOTIC DAILY PO)     Take 2 tablets by mouth daily.           Flagged for Removal             Sodium Chloride-Sodium Bicarb (NETI POT SINUS WASH) 2300-700 MG Kit     1 packet by Nasal route daily.           Review of Systems    Physical Exam    BP: 120/79 mmHg, Heart Rate: 93, Temp: 98.8 F (37.1 C), Resp Rate: 16, SpO2: 97 %    Physical Exam      MDM and ED Course     ED Medication Orders     Start Ordered     Status Ordering Provider    08/02/15 1757 08/02/15 1756  iohexol (OMNIPAQUE) 350 MG/ML injection 100 mL   Once     Route: Intravenous  Ordered Dose: 100 mL     Last MAR action:  Given Ferdinand Cava    08/02/15 1553 08/02/15 1552  LORazepam (  ATIVAN) injection 1 mg   Once     Route: Intravenous  Ordered Dose: 1 mg     Last MAR action:  Given Ferdinand Cava    08/02/15 1553 08/02/15 1552  sodium chloride 0.9 % bolus 1,000 mL   Once     Route: Intravenous  Ordered Dose: 1,000 mL     Last MAR action:  Stopped Ferdinand Cava             MDM          Procedures  Results     Procedure Component Value  Units Date/Time    Comprehensive metabolic panel [[id number]] Collected:  08/02/15 1607    Specimen Information:  Blood Updated:  08/02/15 1631     Glucose 96 mg/dL      BUN 40.9 mg/dL      Creatinine 0.9 mg/dL      Sodium 811 mEq/L      Potassium 4.0 mEq/L      Chloride 106 mEq/L      CO2 24 mEq/L      Calcium 9.3 mg/dL      Protein, Total 6.7 g/dL      Albumin 4.1 g/dL      AST (SGOT) 24 U/L      ALT 23 U/L      Alkaline Phosphatase 56 U/L      Bilirubin, Total 0.3 mg/dL      Globulin 2.6 g/dL      Albumin/Globulin Ratio 1.6      Anion Gap 8.0     Lipase [[id number]] Collected:  08/02/15 1607    Specimen Information:  Blood Updated:  08/02/15 1631     Lipase 20 U/L     GFR [[id number]] Collected:  08/02/15 1607     EGFR >60.0 Updated:  08/02/15 1631    UA with reflex to micro (all freestanding ED's except [facility],  3 + yrs) [[id number]]  (Abnormal) Collected:  08/02/15 1607    Specimen Information:  Urine Updated:  08/02/15 1624     Urine Type Clean Catch      Color, UA YELLOW      Clarity, UA CLEAR      Specific Gravity UA 1.005      Urine pH 6.0      Leukocyte Esterase, UA NEGATIVE      Nitrite, UA NEGATIVE      Protein, UR NEGATIVE      Glucose, UA NEGATIVE      Ketones UA NEGATIVE      Urobilinogen, UA 0.2 mg/dL      Bilirubin, UA NEGATIVE      Blood, UA TRACE-LYSED (A)     Microscopic, Urine [[id number]] Collected:  08/02/15 1607     RBC, UA 0 - 5 /hpf Updated:  08/02/15 1624     WBC, UA None Seen /hpf      Squamous Epithelial Cells, Urine 0 - 5 /hpf     CBC with differential [[id number]] Collected:  08/02/15 1607    Specimen Information:  Blood from Blood Updated:  08/02/15 1613     WBC 7.40 x10 3/uL      Hgb 14.0 g/dL      Hematocrit 03.4 %      Platelets 260 x10 3/uL      RBC 4.46 x10 6/uL      MCV 95.3 fL      MCH 31.4 pg      MCHC 32.9 g/dL  RDW 13 %      MPV 10.1 fL      Neutrophils 59 %      Lymphocytes Automated 31 %      Monocytes 7 %      Eosinophils Automated 3 %      Basophils  Automated 0 %      Immature Granulocyte Unmeasured %      Nucleated RBC Unmeasured /100 WBC      Neutrophils Absolute 4.39 x10 3/uL      Abs Lymph Automated 2.29 x10 3/uL      Abs Mono Automated 0.51 x10 3/uL      Abs Eos Automated 0.19 x10 3/uL      Absolute Baso Automated 0.02 x10 3/uL      Absolute Immature Granulocyte Unmeasured x10 3/uL         Radiology Results (24 Hour)     Procedure Component Value Units Date/Time    CT Abd/Pelvis with IV Contrast [[id number]] Collected:  08/02/15 1915    Order Status:  Completed Updated:  08/02/15 1922    Narrative:      EXAM: CT OF THE ABDOMEN AND PELVIS    TECHNIQUE: 5-mm axial CT images of the abdomen and pelvis were obtained   following bolus administration of nonionic intravenous contrast. Oral  contrast was also administered. The following dose reduction techniques  were utilized: Automated exposure control and/or adjustment of the mA  and /or kV according to the patient's size and the use of the iterative  reconstruction technique.      HISTORY:  Lower abdominal pain, diarrhea    FINDINGS: The large and small bowel demonstrate normal caliber. No  evidence of obstruction or ileus. No focal bowel wall thickening seen to  suggest acute inflammatory changes. No free fluid or free  intraperitoneal air. The appendix is well-visualized and demonstrates  normal morphology.    The liver, spleen, kidneys, adrenal glands and pancreas are  unremarkable. No pelvic or mesenteric adenopathy appreciated. The uterus  is moderately leiomyomatous.    Limited evaluation the lower thorax demonstrates clear lung bases.    Bone window imaging also unremarkable.      Impression:       No imaging correlate for patient's presenting pain symptoms.    Sable Feil, MD   08/02/2015 7:18 PM            Clinical Impression & Disposition     Clinical Impression  Final diagnoses:   Diarrhea, unspecified type   Abdominal pain, unspecified location        ED Disposition     Discharge Monica L  Turner discharge to home/self care.    Condition at disposition: Stable             Discharge Medication List as of 08/02/2015  7:39 PM                      Ferdinand Cava, MD  08/02/15 2041

## 2015-08-02 NOTE — Discharge Instructions (Signed)
YOU MAY SEE THE DOCTOR LISTED IF YOUR DOCTOR IS NOT AVAILABLE FOR FOLLOW UP IN THE TIME FRAME PROVIDED.  RETURN TO THE EMERGENCY DEPARTMENT IMMEDIATELY FOR ANY WORSENING OR CONCERNS.      Diarrhea    You have been diagnosed with diarrhea.    You have diarrhea when you have stools (bowel movements) that are soft or liquid, or when you have too many stools in a day. You may also have stomach cramps/ pains, nausea (feeling sick to your stomach), vomiting and fever (temperature higher than 100.25F / 38C).    Diarrhea can be caused by bacteria, viruses and parasites. People sometimes get "traveler s diarrhea." They get it when they go to other countries. It is caused by bacteria.    People with diarrhea often lose a lot of body fluids. This causes dehydration. Drink a lot of water or other fluids to stay hydrated. You may also be given medicine for your diarrhea. It will slow the diarrhea and stop the vomiting (if you have this symptom).    You should drink lots of natural juices or a sports-type drink that has electrolytes (sodium, potassium, etc). Do not drink a lot of plain water. Sugary drinks like apple and pear juice might make the diarrhea worse.    You might be given medicine to help you with your diarrhea, nausea (feeling sick), vomiting and stomach cramps. This will depend on your age, medical history and symptoms.    It is OK for you to go home today.    Good hygiene (keeping clean) helps to keep the problem from spreading. Please wash your hands often, using soap and water, especially after using the bathroom. Do not prepare food or share food, drinks or utensils (forks, knives, etc.) with other people.    If we asked for a stool sample but you couldn t provide one, you may need to come back with a sample. You will get special instructions about how to handle and return the sample.    It is very important that you follow up with your regular doctor or a specialist. The doctor will tell you how soon  this follow-up needs to be.    YOU SHOULD SEEK MEDICAL ATTENTION IMMEDIATELY, EITHER HERE OR AT THE NEAREST EMERGENCY DEPARTMENT, IF ANY OF THE FOLLOWING OCCURS:   You are vomiting and cannot keep down fluids.   There is blood, pus or mucous in your stool.   You have symptoms of dehydration. These include dry mouth, not urinating (peeing) at least once every eight hours, feeling dizzy/lightheaded, severe weakness or passing out.            Hassie Bruce, M.D.  [facility]  [address], [address]  [place], [place].  [address]  [phone]  Fax:[phone]    Abdominal Pain    Date Time: 08/02/2015 7:36 PM  Patient Name: Monica Turner  Referring Physician:         Reason for Consultation:   ***    History:   Layne Lebon Starkes is a 54 y.o. female who presents to the office for *** .    Duration of pain: ***    Location of Pain: {Location of Pain:35735}    Radiation: {Radiation:35736}    Progression: {Progression GI:35737}    Exacerbating factors: {Exacerbating factors:35738}     Alleviating factors: {Alleviating factors:35739}:     Associated symptoms: {Associated symptoms:35740}    Symptoms specifically denied: {Symptoms specifically denied GI:35741}    Additional  notes: ***     Past Medical History:     Past Medical History   Diagnosis Date   . Other plastic surgery for unacceptable cosmetic appearance    . Asthma without status asthmaticus    . Headache(784.0)      Sinus   . Gastroesophageal reflux disease    . Irritable bowel syndrome    . Low back pain    . Arthritis      Neck Right Hand   . Claustrophobia    . Depression    . Seasonal allergies    . Sciatica of left side    . Smokes with greater than 40 pack year history      Quit 2 weks ago.   . Sleep apnea        Past Surgical History:     Past Surgical History   Procedure Laterality Date   . Egd, colonoscopy  2015   . Abdominoplasty, liposuction (cosmetic)  2009   . Liposuction, neck (cosmetic)  2009   . Hysteroscopy,  endometrial ablation, thermachoice  2015   . Cystoscopy, ureteroscopy  2015   . Facelift, (rhytidectomy) (cosmetic)  02/06/2014     Procedure: FACELIFT, (RHYTIDECTOMY) (COSMETIC);  Surgeon: Katheren Puller, MD;  Location: [facility] OR;  Service: Plastics;  Laterality: Bilateral;  RHYTIDECTOMY (COSMETIC)        Family History:     Family History   Problem Relation Age of Onset   . Anesthesia problems Sister        Social History:     Social History     Social History   . Marital Status: Married     Spouse Name: N/A   . Number of Children: N/A   . Years of Education: N/A     Occupational History   . Not on file.     Social History Main Topics   . Smoking status: Former Smoker -- 1.00 packs/day for 40 years     Quit date: 12/17/2014   . Smokeless tobacco: Not on file   . Alcohol Use: Yes      Comment: 2 X Month 2-4 Beers   . Drug Use: No   . Sexual Activity: Not on file     Other Topics Concern   . Not on file     Social History Narrative       Allergies:   No Known Allergies    Medications:   No current facility-administered medications for this encounter.    Current outpatient prescriptions:   .  buPROPion XL (WELLBUTRIN XL) 150 MG 24 hr tablet, TAKE ONE TABLET BY MOUTH EVERY DAY, Disp: , Rfl: 1  .  Cholecalciferol (VITAMIN D-3 PO), Take 1 tablet by mouth daily., Disp: , Rfl:   .  clonazePAM (KLONOPIN) 0.5 MG tablet, Take 1 tablet (0.5 mg total) by mouth 2 (two) times daily as needed., Disp: 45 tablet, Rfl: 1  .  DULoxetine (CYMBALTA) 60 MG capsule, , Disp: , Rfl:   .  estradiol-norethindrone (COMBIPATCH) 0.05-0.14 MG/DAY, Place 1 patch onto the skin twice a week., Disp: 24 patch, Rfl: 1  .  montelukast (SINGULAIR) 10 MG tablet, Take 1 tablet (10 mg total) by mouth nightly., Disp: 30 tablet, Rfl: 0  .  Multiple Vitamin (MULTIVITAMIN) tablet, Take 1 tablet by mouth daily., Disp: , Rfl:   .  Probiotic Product (PROBIOTIC DAILY PO), Take 2 tablets by mouth daily., Disp: , Rfl:   .  PROAIR HFA 108 (  90 BASE) MCG/ACT  inhaler, , Disp: , Rfl:   .  Sodium Chloride-Sodium Bicarb (NETI POT SINUS WASH) 2300-700 MG Kit, 1 packet by Nasal route daily., Disp: 30 each, Rfl: 0    Facility-Administered Medications Ordered in Other Encounters:   .  ceFAZolin (ANCEF) 1 g in sodium chloride 0.9 % 50 mL IVPB mini-bag plus, 1 g, Intravenous, Q8H SCH, Katheren Puller, MD, 1 g at 02/06/14 5643  .  oxyCODONE-acetaminophen (PERCOCET) 5-325 MG per tablet 1 tablet, 1 tablet, Oral, Q4H PRN, Ledell Noss, Benjamine Mola, MD    Review of Systems:   Constitutional:No fever,chills,weight loss or gain.  Integument:No skin rashes or lesions.  Eyes:No changes in vision  ENMT:No changes in hearing,nasal discharge,sore throat,oral lesions.  Respiratory:No shortness of breath or cough.  Cardiovascular:No chest pain or palpitations.  Genitourinary:No nocturia,urinary frequency, or dysuria.  Gastrointestinal:See HPI.  Musculoskeletal:No back or joint pain.  Neurologic:No migraine headaches,numbness, or tingling.  Endocrine:No polydipsia,cold or heat intolerance.  Hematologic:No bleeding or bruising.  Psychiatric:No depression or anxiety.     Physical Exam:     Filed Vitals:    08/02/15 1538 08/02/15 1600 08/02/15 1720 08/02/15 1919   BP: 120/79 129/86 112/60 112/62   Pulse: 93 91 82 80   Temp: 98.8 F (37.1 C)      TempSrc: Oral      Resp: 16  14    SpO2: 97% 99% 97% 98%         General appearance - Well developed and well nourished. Normal gait.  HEENT- Normocephalic. Eomi. Sclera anicteric. Normal hearing. Nares normal.  Oropharynx normal.  Neck - Supple.  No thyromegaly.  No cervical lymphadenopathy.  Chest - clear to percussion and auscultation.  No wheeze, rhonchi, or rales.  Heart - regular rate and rhythm without murmurs, gallops, or rubs.  Abdomen - normal bowel sounds.  No focal tenderness to palpation. No hepatosplenomegaly.  No masses. No ascites.  Rectal - deferred until time of colonoscopy.  Musculoskeletal - normal range of motion of arms and  legs.  Extremities - no clubbing, cyanosis, or edema.  Skin - no rashes or lesions.  Neurologic - Alert and oriented to person, place and time.  No focal motor or sensory deficits.  No asterixis.  Recent and remote memory normal.         Assessment:   ***    Plan:   ***    Signed by: Ferdinand Cava, MD    All recent labs and radiologic studies were reviewed at the time of the visit. Thank you for allowing me to participate in the care of your patient.

## 2015-08-03 ENCOUNTER — Ambulatory Visit: Attending: Emergency Medicine

## 2015-08-03 DIAGNOSIS — R197 Diarrhea, unspecified: Secondary | ICD-10-CM | POA: Insufficient documentation

## 2015-08-07 ENCOUNTER — Ambulatory Visit (INDEPENDENT_AMBULATORY_CARE_PROVIDER_SITE_OTHER): Admitting: Family Medicine

## 2015-08-07 ENCOUNTER — Encounter (INDEPENDENT_AMBULATORY_CARE_PROVIDER_SITE_OTHER): Payer: Self-pay | Admitting: Family Medicine

## 2015-08-07 VITALS — BP 135/84 | HR 83 | Temp 97.8°F | Resp 16 | Ht 66.0 in | Wt 172.0 lb

## 2015-08-07 DIAGNOSIS — F419 Anxiety disorder, unspecified: Secondary | ICD-10-CM

## 2015-08-07 DIAGNOSIS — R102 Pelvic and perineal pain: Secondary | ICD-10-CM

## 2015-08-07 NOTE — Progress Notes (Signed)
Subjective:      Date: 08/07/2015 4:53 PM   Patient ID: Monica Turner is a 54 y.o. female.    Chief Complaint:  Chief Complaint   Patient presents with   . Anxiety       HPI:  Anxiety  Presents for follow-up visit. Symptoms include depressed mood, excessive worry, irritability, nervous/anxious behavior and restlessness. Patient reports no insomnia or palpitations. Primary symptoms comment: chest tightness. The quality of sleep is fair. Nighttime awakenings: occasional.     Risk factors: stress, loss of child. Her past medical history is significant for anxiety/panic attacks. Past treatments include benzodiazephines, non-SSRI antidepressants and SSRIs. Compliance with prior treatments has been good.   Patient is seen by psychiatry-Anne Carnes but she does not want to follow up with her any longer. She is having trouble finding a new psychiatrist that will accept her insurance. Patient previously had prescriptions denied by Dr. Algis Downs and Dr. Nedra Hai at our practice.    Patient also requires an order for pelvic ultrasound to evaluate for pelvic mass previously seen on another imaging study.    Problem List:  Patient Active Problem List   Diagnosis   . H/O cosmetic surgery   . H/O cosmetic plastic surgery       Current Medications:  Current Outpatient Prescriptions   Medication Sig Dispense Refill   . buPROPion XL (WELLBUTRIN XL) 150 MG 24 hr tablet TAKE ONE TABLET BY MOUTH EVERY DAY  1   . Cholecalciferol (VITAMIN D-3 PO) Take 1 tablet by mouth daily.     . cyclobenzaprine (FLEXERIL) 5 MG tablet Take 5 mg by mouth 3 (three) times daily as needed for Muscle spasms.     . DULoxetine (CYMBALTA) 60 MG capsule      . estradiol-norethindrone (COMBIPATCH) 0.05-0.14 MG/DAY Place 1 patch onto the skin twice a week. 24 patch 1   . montelukast (SINGULAIR) 10 MG tablet Take 1 tablet (10 mg total) by mouth nightly. 30 tablet 0   . Multiple Vitamin (MULTIVITAMIN) tablet Take 1 tablet by mouth daily.     . naproxen (NAPROSYN) 500 MG  tablet      . PROAIR HFA 108 (90 BASE) MCG/ACT inhaler      . Probiotic Product (PROBIOTIC DAILY PO) Take 2 tablets by mouth daily.     . clonazePAM (KLONOPIN) 0.5 MG tablet Take 1 tablet (0.5 mg total) by mouth 2 (two) times daily as needed. 45 tablet 1     No current facility-administered medications for this visit.     Facility-Administered Medications Ordered in Other Visits   Medication Dose Route Frequency Provider Last Rate Last Dose   . ceFAZolin (ANCEF) 1 g in sodium chloride 0.9 % 50 mL IVPB mini-bag plus  1 g Intravenous [facility] Katheren Puller, MD   1 g at 02/06/14 6045   . oxyCODONE-acetaminophen (PERCOCET) 5-325 MG per tablet 1 tablet  1 tablet Oral Q4H PRN Katheren Puller, MD           Allergies:  No Known Allergies    Past Medical History:  Past Medical History   Diagnosis Date   . Other plastic surgery for unacceptable cosmetic appearance    . Asthma without status asthmaticus    . Headache(784.0)      Sinus   . Gastroesophageal reflux disease    . Irritable bowel syndrome    . Low back pain    . Arthritis      Neck Right Hand   .  Claustrophobia    . Depression    . Seasonal allergies    . Sciatica of left side    . Smokes with greater than 40 pack year history      Quit 2 weks ago.   . Sleep apnea        Past Surgical History:  Past Surgical History   Procedure Laterality Date   . Egd, colonoscopy  2015   . Abdominoplasty, liposuction (cosmetic)  2009   . Liposuction, neck (cosmetic)  2009   . Hysteroscopy, endometrial ablation, thermachoice  2015   . Cystoscopy, ureteroscopy  2015   . Facelift, (rhytidectomy) (cosmetic)  02/06/2014     Procedure: FACELIFT, (RHYTIDECTOMY) (COSMETIC);  Surgeon: Katheren Puller, MD;  Location: [facility] OR;  Service: Plastics;  Laterality: Bilateral;  RHYTIDECTOMY (COSMETIC)        Family History:  Family History   Problem Relation Age of Onset   . Anesthesia problems Sister    . Heart disease Mother    . Heart disease Father    . Cancer Maternal  Grandfather        Social History:  Social History     Social History   . Marital Status: Married     Spouse Name: N/A   . Number of Children: N/A   . Years of Education: N/A     Occupational History   . Not on file.     Social History Main Topics   . Smoking status: Current Some Day Smoker -- 1.00 packs/day for 40 years     Types: Cigarettes     Last Attempt to Quit: 12/17/2014   . Smokeless tobacco: Never Used   . Alcohol Use: No   . Drug Use: No   . Sexual Activity: Not Currently     Other Topics Concern   . Not on file     Social History Narrative       The following portions of the patient's history were reviewed and updated as appropriate: allergies, current medications, past family history, past medical history, past social history, past surgical history and problem list.      ROS:  General/Constitutional:   Denies Chills. Denies Fatigue. Denies Fever.   Cardiovascular:   Denies Chest pain. Denies Chest pain with exertion. Denies Palpitations.   Gastrointestinal:   Admits Abdominal (pelvic) pain. Denies Nausea. Denies Vomiting.   Neurologic:   Denies Dizziness. Denies Headache. Denies Tingling/Numbness.       Objective:   BP 135/84 mmHg  Pulse 83  Temp(Src) 97.8 F (36.6 C) (Oral)  Resp 16  Ht 1.676 m (5\' 6" )  Wt 78.019 kg (172 lb)  BMI 27.77 kg/m2  SpO2 99%    Physical Exam:  General Examination:   GENERAL APPEARANCE: alert, in no acute distress, well developed, well nourished, oriented to time, place, and person.   ORAL CAVITY: mucosa moist.   LUNGS: normal effort / no distress.  EXTREMITIES: no clubbing, cyanosis, or edema bilaterally.   NEUROLOGIC: alert and oriented.       Assessment/Plan:       1. Anxiety  Chronic, stable. Advised patient that I would need to speak to Dr. D prior to refilling prescriptions based on her prior medical records in the system. Also advised patient to follow up with psychiatrist in the interim so as not to run out of medications.    2. Pelvic pain in female  - US  Pelvis with Transvaginal; Future  Chancy Milroy, MD

## 2015-08-11 ENCOUNTER — Other Ambulatory Visit: Payer: Self-pay

## 2015-08-12 ENCOUNTER — Emergency Department

## 2015-08-12 ENCOUNTER — Emergency Department
Admission: EM | Admit: 2015-08-12 | Discharge: 2015-08-12 | Disposition: A | Discharge status: Home / Self Care | Attending: Emergency Medicine | Admitting: Emergency Medicine

## 2015-08-12 ENCOUNTER — Other Ambulatory Visit (INDEPENDENT_AMBULATORY_CARE_PROVIDER_SITE_OTHER): Payer: Self-pay | Admitting: Family Medicine

## 2015-08-12 DIAGNOSIS — K219 Gastro-esophageal reflux disease without esophagitis: Secondary | ICD-10-CM | POA: Insufficient documentation

## 2015-08-12 DIAGNOSIS — J45909 Unspecified asthma, uncomplicated: Secondary | ICD-10-CM | POA: Insufficient documentation

## 2015-08-12 DIAGNOSIS — R102 Pelvic and perineal pain: Secondary | ICD-10-CM

## 2015-08-12 DIAGNOSIS — R0789 Other chest pain: Secondary | ICD-10-CM | POA: Insufficient documentation

## 2015-08-12 DIAGNOSIS — F17210 Nicotine dependence, cigarettes, uncomplicated: Secondary | ICD-10-CM | POA: Insufficient documentation

## 2015-08-12 DIAGNOSIS — R1032 Left lower quadrant pain: Secondary | ICD-10-CM | POA: Insufficient documentation

## 2015-08-12 LAB — LIPASE: Lipase: 20 U/L (ref 8–78)

## 2015-08-12 LAB — CBC AND DIFFERENTIAL
Basophils Absolute Automated: 0.02 10*3/uL (ref 0.00–0.20)
Basophils Automated: 0 %
Eosinophils Absolute Automated: 0.37 10*3/uL (ref 0.00–0.70)
Eosinophils Automated: 4 %
Hematocrit: 40.7 % (ref 37.0–47.0)
Hgb: 13.4 g/dL (ref 12.0–16.0)
Lymphocytes Absolute Automated: 2.93 10*3/uL (ref 0.50–4.40)
Lymphocytes Automated: 34 %
MCH: 30.9 pg (ref 28.0–32.0)
MCHC: 32.9 g/dL (ref 32.0–36.0)
MCV: 94 fL (ref 80.0–100.0)
MPV: 11.1 fL (ref 9.4–12.3)
Monocytes Absolute Automated: 0.63 10*3/uL (ref 0.00–1.20)
Monocytes: 7 %
Neutrophils Absolute: 4.71 10*3/uL (ref 1.80–8.10)
Neutrophils: 54 %
Platelets: 211 10*3/uL (ref 140–400)
RBC: 4.33 10*6/uL (ref 4.20–5.40)
RDW: 13 % (ref 12–15)
WBC: 8.66 10*3/uL (ref 3.50–10.80)

## 2015-08-12 LAB — IHS D-DIMER: D-Dimer: <0.27 ug/mL FEU (ref 0.00–0.51)

## 2015-08-12 LAB — COMPREHENSIVE METABOLIC PANEL
ALT: 21 U/L (ref 0–55)
AST (SGOT): 26 U/L (ref 5–34)
Albumin/Globulin Ratio: 1.7 (ref 0.9–2.2)
Albumin: 4.4 g/dL (ref 3.5–5.0)
Alkaline Phosphatase: 60 U/L (ref 37–106)
Anion Gap: 11 (ref 5.0–15.0)
BUN: 19 mg/dL (ref 7.0–19.0)
Bilirubin, Total: 0.3 mg/dL (ref 0.2–1.2)
CO2: 23 mEq/L (ref 22–29)
Calcium: 9.6 mg/dL (ref 8.5–10.5)
Chloride: 104 mEq/L (ref 100–111)
Creatinine: 0.9 mg/dL (ref 0.6–1.0)
Globulin: 2.6 g/dL (ref 2.0–3.6)
Glucose: 109 mg/dL — ABNORMAL HIGH (ref 70–100)
Potassium: 3.8 mEq/L (ref 3.5–5.1)
Protein, Total: 7 g/dL (ref 6.0–8.3)
Sodium: 138 mEq/L (ref 136–145)

## 2015-08-12 LAB — TROPONIN I: Troponin I: <0.01 ng/mL (ref 0.00–0.09)

## 2015-08-12 LAB — GFR: EGFR: >60

## 2015-08-12 LAB — CELL MORPHOLOGY
Cell Morphology: NORMAL
Platelet Estimate: NORMAL

## 2015-08-12 MED ORDER — LORAZEPAM 2 MG/ML IJ SOLN
1.0000 mg | Freq: Once | INTRAMUSCULAR | Status: AC
Start: 2015-08-12 — End: 2015-08-12
  Administered 2015-08-12: 1 mg via INTRAVENOUS
  Filled 2015-08-12: qty 1

## 2015-08-12 MED ORDER — ONDANSETRON HCL 4 MG/2ML IJ SOLN
4.0000 mg | Freq: Once | INTRAMUSCULAR | Status: AC
Start: 2015-08-12 — End: 2015-08-12
  Administered 2015-08-12: 4 mg via INTRAVENOUS
  Filled 2015-08-12: qty 2

## 2015-08-12 MED ORDER — ONDANSETRON 4 MG PO TBDP
4.0000 mg | ORAL_TABLET | Freq: Four times a day (QID) | ORAL | Status: DC | PRN
Start: 2015-08-12 — End: 2015-12-05

## 2015-08-12 MED ORDER — SODIUM CHLORIDE 0.9 % IV BOLUS
1000.0000 mL | Freq: Once | INTRAVENOUS | Status: AC
Start: 2015-08-12 — End: 2015-08-12
  Administered 2015-08-12: 1000 mL via INTRAVENOUS

## 2015-08-12 MED ORDER — ONDANSETRON 4 MG PO TBDP
4.0000 mg | ORAL_TABLET | Freq: Four times a day (QID) | ORAL | Status: DC | PRN
Start: 2015-08-12 — End: 2015-08-12

## 2015-08-12 MED ORDER — IOHEXOL 350 MG/ML IV SOLN
100.0000 mL | Freq: Once | INTRAVENOUS | Status: AC | PRN
Start: 2015-08-12 — End: 2015-08-12
  Administered 2015-08-12: 100 mL via INTRAVENOUS

## 2015-08-12 MED ORDER — ALBUTEROL SULFATE HFA 108 (90 BASE) MCG/ACT IN AERS
2.0000 | INHALATION_SPRAY | RESPIRATORY_TRACT | Status: DC | PRN
Start: 2015-08-12 — End: 2015-08-12

## 2015-08-12 NOTE — ED Notes (Signed)
pt c/o cp/sob/left side pain w/ sob/nausea. pt took (2)325mg  of ASA prior to arrival.

## 2015-08-12 NOTE — Discharge Instructions (Signed)
Rest and avoid aggravating activities.  Please follow-up.  Return to the ER for any concerns.    Abdominal Pain    You have been diagnosed with abdominal (belly) pain. The cause of your pain is not yet known.    Many things can cause abdominal pain. Examples include viral infections and bowel (intestine) spasms. You might need another examination or more tests to find out why you have pain.    At this time, your pain does not seem to be caused by anything dangerous. You do not need surgery. You do not need to stay in the hospital.     Though we don't believe your condition is dangerous right now, it is important to be careful. Sometimes a problem that seems mild can become serious later. This is why it is very important that you return here or go to the nearest Emergency Department unless you are 100% improved.    Return here or go to the nearest Emergency Department, or follow up with your physician in:   24 hours.    Drink only clear liquids such as water, clear broth, sports drinks, or clear caffeine-free soft drinks, like 7-Up or Sprite, for the next:   24 hours.    YOU SHOULD SEEK MEDICAL ATTENTION IMMEDIATELY, EITHER HERE OR AT THE NEAREST EMERGENCY DEPARTMENT, IF ANY OF THE FOLLOWING OCCURS:   Your pain does not go away or gets worse.   You cannot keep fluids down or your vomit is dark green.    You vomit blood or see blood in your stool. Blood might be bright red or dark red. It can also be black and look like tar.   You have a fever (temperature higher than 100.67F / 38C) or shaking chills.   Your skin or eyes look yellow or your urine looks brown.   You have severe diarrhea.

## 2015-08-12 NOTE — ED Provider Notes (Signed)
Physician/Midlevel provider first contact with patient: 08/12/15 1656         History     Chief Complaint   Patient presents with   . Chest Pain     HPI Comments: 54 year old female presents to the ER complaining of left lower quadrant pain.  Patient reports the pain began this morning before worsening recently and radiating to her left upper quadrant quadrant.  Patient complained of associated nausea, but states "it feels different."  Patient also reports that she is unsure if she is short of breath or if She is having associated anxiety.  Patient reports long history of anxiety reports that she is feeling anxious.  Patient denies any recent travel.  Patient smokes 3 cigarettes a day.  Patient took aspirin prior to arrival.  Patient rates the pain at 4 out of 10 with radiation to left upper quadrant and chest.    Patient is a 54 y.o. female presenting with abdominal pain. The history is provided by the patient. No language interpreter was used.   Abdominal Pain  Pain location:  LLQ  Pain quality: pressure and sharp    Pain radiates to:  LUQ and chest  Pain severity:  Moderate  Onset quality:  Gradual  Timing:  Constant  Progression:  Worsening  Chronicity:  New  Context: recent illness    Relieved by:  None tried  Exacerbated by: anxiety.  Ineffective treatments:  None tried  Associated symptoms: chest pain, nausea and shortness of breath    Associated symptoms: no chills, no cough, no diarrhea, no dysuria, no fever, no hematuria and no vomiting    Chest pain:     Quality:  Pressure    Severity:  Moderate    Onset quality:  Gradual    Timing:  Constant    Progression:  Unchanged    Chronicity:  New  Nausea:     Severity:  Moderate    Onset quality:  Gradual    Timing:  Constant    Progression:  Unchanged  Shortness of breath:     Severity:  Mild    Onset quality:  Gradual    Timing:  Constant    Progression:  Unchanged  Risk factors: not pregnant             Past Medical History   Diagnosis Date   . Other plastic  surgery for unacceptable cosmetic appearance    . Asthma without status asthmaticus    . Headache(784.0)      Sinus   . Gastroesophageal reflux disease    . Irritable bowel syndrome    . Low back pain    . Arthritis      Neck Right Hand   . Claustrophobia    . Depression    . Seasonal allergies    . Sciatica of left side    . Smokes with greater than 40 pack year history      Quit 2 weks ago.   . Sleep apnea        Past Surgical History   Procedure Laterality Date   . Egd, colonoscopy  2015   . Abdominoplasty, liposuction (cosmetic)  2009   . Liposuction, neck (cosmetic)  2009   . Hysteroscopy, endometrial ablation, thermachoice  2015   . Cystoscopy, ureteroscopy  2015   . Facelift, (rhytidectomy) (cosmetic)  02/06/2014     Procedure: FACELIFT, (RHYTIDECTOMY) (COSMETIC);  Surgeon: Katheren Puller, MD;  [facility]: [facility];  Service: Plastics;  Laterality:  Bilateral;  RHYTIDECTOMY (COSMETIC)        Family History   Problem Relation Age of Onset   . Anesthesia problems Sister    . Heart disease Mother    . Heart disease Father    . Cancer Maternal Grandfather        Social-lives with family   Social History   Substance Use Topics   . Smoking status: Current Some Day Smoker -- 1.00 packs/day for 40 years     Types: Cigarettes     Last Attempt to Quit: 12/17/2014   . Smokeless tobacco: Never Used   . Alcohol Use: No       .     No Known Allergies    Home Medications     Last Medication Reconciliation Action:  Complete Asghedom, Vanessa Kick, RN 08/12/2015  5:03 PM                  buPROPion XL (WELLBUTRIN XL) 150 MG 24 hr tablet     TAKE ONE TABLET BY MOUTH EVERY DAY     Cholecalciferol (VITAMIN D-3 PO)     Take 1 tablet by mouth daily.     cyclobenzaprine (FLEXERIL) 5 MG tablet     Take 5 mg by mouth 3 (three) times daily as needed for Muscle spasms.     DULoxetine (CYMBALTA) 60 MG capsule          esomeprazole (NEXIUM) 40 MG capsule     Take 40 mg by mouth every morning before breakfast.      estradiol-norethindrone (COMBIPATCH) 0.05-0.14 MG/DAY     Place 1 patch onto the skin twice a week.     montelukast (SINGULAIR) 10 MG tablet     Take 1 tablet (10 mg total) by mouth nightly.     Multiple Vitamin (MULTIVITAMIN) tablet     Take 1 tablet by mouth daily.     naproxen (NAPROSYN) 500 MG tablet          Probiotic Product (PROBIOTIC DAILY PO)     Take 2 tablets by mouth daily.           Review of Systems   Constitutional: Negative for fever and chills.   HENT: Negative for congestion and rhinorrhea.    Eyes: Negative for discharge and redness.   Respiratory: Positive for shortness of breath. Negative for cough.    Cardiovascular: Positive for chest pain. Negative for palpitations.   Gastrointestinal: Positive for nausea and abdominal pain. Negative for vomiting and diarrhea.   Genitourinary: Negative for dysuria, urgency, frequency, hematuria and flank pain.   Musculoskeletal: Negative for back pain, gait problem, neck pain and neck stiffness.   Skin: Negative for color change, pallor and wound.   Neurological: Negative for dizziness, syncope, weakness, numbness and headaches.   Hematological: Does not bruise/bleed easily.   Psychiatric/Behavioral: Negative for self-injury. The patient is not nervous/anxious.        Physical Exam    BP: (!) 134/96 mmHg, Heart Rate: (!) 107, Temp: 97.1 F (36.2 C), Resp Rate: 18, SpO2: 100 %    Physical Exam   Constitutional: She is oriented to person, place, and time. She appears well-developed and well-nourished. No distress.   Patient is very anxious   HENT:   Head: Normocephalic and atraumatic.   Right Ear: External ear normal.   Left Ear: External ear normal.   Nose: Nose normal.   Mouth/Throat: Oropharynx is clear and moist. No oropharyngeal exudate.   Eyes:  Conjunctivae are normal. Pupils are equal, round, and reactive to light. Right eye exhibits no discharge. Left eye exhibits no discharge. No scleral icterus.   Neck: Normal range of motion. Neck supple. No JVD  present. No tracheal deviation present.   Cardiovascular: Regular rhythm.  Tachycardia present.    Pulmonary/Chest: Effort normal and breath sounds normal. No respiratory distress. She has no wheezes. She has no rales. She exhibits no tenderness.   Abdominal: Soft. Bowel sounds are normal. She exhibits no distension and no mass. There is tenderness. There is no guarding. No hernia.   Left lower quadrant palpation causes pain to left upper quadrant and chest   Musculoskeletal: Normal range of motion. She exhibits no edema or tenderness.   Neurological: She is alert and oriented to person, place, and time.   Skin: Skin is warm and dry. No rash noted. She is not diaphoretic.   Psychiatric: Her speech is normal and behavior is normal. Judgment and thought content normal. Her mood appears anxious.   Patient is very anxious   Nursing note and vitals reviewed.        MDM and ED Course     ED Medication Orders     Start Ordered     Status Ordering Provider    08/12/15 1855 08/12/15 1855  iohexol (OMNIPAQUE) 350 MG/ML injection 100 mL   IMG once as needed     Route: Intravenous  Ordered Dose: 100 mL     Last MAR action:  Imaging Agent Given MCDAVIT, MICHAEL K    08/12/15 1802 08/12/15 1801  ondansetron (ZOFRAN) injection 4 mg   Once     Route: Intravenous  Ordered Dose: 4 mg     Last MAR action:  Given CONCAUGH-GRUENDEL, Evren Shankland ELIZABETH    08/12/15 1701 08/12/15 1700  sodium chloride 0.9 % bolus 1,000 mL   Once     Route: Intravenous  Ordered Dose: 1,000 mL     Last MAR action:  Stopped CONCAUGH-GRUENDEL, Susy Placzek ELIZABETH    08/12/15 1701 08/12/15 1700  LORazepam (ATIVAN) injection 1 mg   Once     Route: Intravenous  Ordered Dose: 1 mg     Last MAR action:  Given CONCAUGH-GRUENDEL, Brizeyda Holtmeyer ELIZABETH             MDM  Number of Diagnoses or Management Options  LLQ abdominal pain:   Other chest pain:   Diagnosis management comments: ILangley Gauss, PA-C, have been the primary provider for Doralee Albino  [place] during this Emergency Dept visit. The attending signature signifies review and agreement of the history, physical exam, evaluation, clinical impression and plan except as noted.   I have reviewed the nursing notes, including Past medical and surgical,Family and Social History     EKG Interpretation  EKG interpreted by ED physician    Rate: Tachycardic  Rhythm: sinus tachycardia  Axis: Normal  ST Segments: No acute ST segment changes  T waves: No acute T Wave changes  Conduction: No blocks  Impression: Non-specific EKG  Interpreted by Dr. Serita Grammes    Differential diagnosis: Abdominal pain, irritable bowel syndrome exacerbation, gas, diarrhea, constipation, diverticulitis, anxiety, chest pain    Labs reviewed by me.  Complete blood count is within normal limits, d-dimer is within normal limits, complete metabolic panel is within normal limits, troponin is within normal limits    Multiple reassessments of the patient.  Patient is resting comfortably in no acute distress with stable vital signs.  Patient reports that she  is feeling much better.  Discussed with patient all results and need for follow-up.  Patient voices understanding and agrees. Multiple reassessments of the patient. Pt resting comfortably in NAD. Pt with relief after meds. Discussed with patient need for rest, avoid aggravating activities and follow-up. Return to the ER for any concerns. Pt voices understanding. No questions.                          Amount and/or Complexity of Data Reviewed  Clinical lab tests: ordered and reviewed  Tests in the radiology section of CPT: ordered and reviewed  Discuss the patient with other providers: yes  Independent visualization of images, tracings, or specimens: yes    Risk of Complications, Morbidity, and/or Mortality  Presenting problems: moderate  Management options: moderate    Patient Progress  Patient progress: stable            Procedures  Results     Procedure Component Value Units Date/Time     Troponin I [[id number]] Collected:  08/12/15 1706    Specimen Information:  Blood Updated:  08/12/15 1737     Troponin I <0.01 ng/mL     CBC with differential [[id number]] Collected:  08/12/15 1706    Specimen Information:  Blood from Blood Updated:  08/12/15 1735     WBC 8.66 x10 3/uL      Hgb 13.4 g/dL      Hematocrit 13.2 %      Platelets 211 x10 3/uL      RBC 4.33 x10 6/uL      MCV 94.0 fL      MCH 30.9 pg      MCHC 32.9 g/dL      RDW 13 %      MPV 11.1 fL      Neutrophils 54 %      Lymphocytes Automated 34 %      Monocytes 7 %      Eosinophils Automated 4 %      Basophils Automated 0 %      Immature Granulocyte Unmeasured %      Nucleated RBC Unmeasured /100 WBC      Neutrophils Absolute 4.71 x10 3/uL      Abs Lymph Automated 2.93 x10 3/uL      Abs Mono Automated 0.63 x10 3/uL      Abs Eos Automated 0.37 x10 3/uL      Absolute Baso Automated 0.02 x10 3/uL      Absolute Immature Granulocyte Unmeasured x10 3/uL     Cell MorpHology [[id number]] Collected:  08/12/15 1706     Cell Morphology: Normal Updated:  08/12/15 1735     Platelet Estimate Normal     Comprehensive metabolic panel [[id number]]  (Abnormal) Collected:  08/12/15 1706    Specimen Information:  Blood Updated:  08/12/15 1734     Glucose 109 (H) mg/dL      BUN 42.5 mg/dL      Creatinine 0.9 mg/dL      Sodium 956 mEq/L      Potassium 3.8 mEq/L      Chloride 104 mEq/L      CO2 23 mEq/L      Calcium 9.6 mg/dL      Protein, Total 7.0 g/dL      Albumin 4.4 g/dL      AST (SGOT) 26 U/L      ALT 21 U/L      Alkaline Phosphatase 60 U/L  Bilirubin, Total 0.3 mg/dL      Globulin 2.6 g/dL      Albumin/Globulin Ratio 1.7      Anion Gap 11.0     Lipase [[id number]] Collected:  08/12/15 1706    Specimen Information:  Blood Updated:  08/12/15 1734     Lipase 20 U/L     GFR [[id number]] Collected:  08/12/15 1706     EGFR >60.0 Updated:  08/12/15 1734    D-Dimer [[id number]] Collected:  08/12/15 1706     D-Dimer <0.27 ug/mL FEU Updated:  08/12/15 1728          Radiology  Results (24 Hour)     Procedure Component Value Units Date/Time    CT Abd/Pelvis with IV Contrast only [[id number]] Collected:  08/12/15 1922    Order Status:  Completed Updated:  08/12/15 1939    Narrative:      CLINICAL HISTORY: Left lower quadrant pain.    FINDINGS: Spiral CT examination of the abdomen and pelvis was performed  from the lung bases to the symphysis pubis with the administration of  nonionic intravenous contrast. Oral contrast was not provided which  limits the examination. The following dose reduction techniques were  utilized: automated exposure control and/or adjustment of the mA and/or  kV according to patient size, and the use of iterative reconstruction  technique. Contrast dose: 100 ml of Omnipaque 350. Comparison  08/02/2015.    Evaluation of the lung bases demonstrates no infiltrates or effusions.  There is a cyst within the inferior margin of the right hepatic lobe  measuring 1.4 x 1.1 cm. The gallbladder, kidneys, right adrenal gland,  spleen, and pancreas are normal. There is a 1.2 x 0.9 cm left adrenal  lesion, likely an adenoma. There is no bowel dilatation or wall  thickening. The appendix is visualized and is normal. There are  scattered colonic diverticula. There is no evidence of diverticulitis.  There is a fibroid within the right posterior aspect of the uterus  measuring approximately 2.0 x 1.5 cm. There is no free fluid, adenopathy  or inflammatory change.      Impression:       No evidence for acute abdominal or pelvic pathology.    Collene Schlichter, MD   08/12/2015 7:35 PM      XR Chest 2 Views [[id number]] Collected:  08/12/15 1751    Order Status:  Completed Updated:  08/12/15 1756    Narrative:      CLINICAL HISTORY: Chest pain.    FINDINGS: PA and lateral views of the chest were performed. The  cardiomediastinal silhouette and hilar regions are normal. No effusions  or focal infiltrates are visualized. There is curvilinear density within  the right middle lobe consistent  with scarring. There is a subcentimeter  nodular density within the left upper lobe, likely a granuloma. The  pulmonary vascular pattern is normal.       Impression:         1. No acute cardiopulmonary disease.  2. Mild right middle lobe scarring.    Collene Schlichter, MD   08/12/2015 5:52 PM              Clinical Impression & Disposition     Clinical Impression  Final diagnoses:   LLQ abdominal pain   Other chest pain        ED Disposition     Discharge Japleen L Rossano discharge to home/self care.    Condition at disposition: Stable  New Prescriptions    ONDANSETRON (ZOFRAN ODT) 4 MG DISINTEGRATING TABLET    Take 1 tablet (4 mg total) by mouth every 6 (six) hours as needed for Nausea.                   Cleon Gustin, [place]  08/12/15 1958

## 2015-08-13 LAB — ECG 12-LEAD
Atrial Rate: 101 {beats}/min
P Axis: 57 degrees
P-R Interval: 140 ms
Q-T Interval: 356 ms
QRS Duration: 72 ms
QTC Calculation (Bezet): 461 ms
R Axis: 29 degrees
T Axis: 47 degrees
Ventricular Rate: 101 {beats}/min

## 2015-09-11 ENCOUNTER — Ambulatory Visit (INDEPENDENT_AMBULATORY_CARE_PROVIDER_SITE_OTHER): Admitting: Emergency Medicine

## 2015-09-11 VITALS — BP 122/66 | HR 77 | Temp 98.8°F | Resp 20 | Ht 66.0 in | Wt 171.0 lb

## 2015-09-11 DIAGNOSIS — R0789 Other chest pain: Secondary | ICD-10-CM

## 2015-09-11 DIAGNOSIS — F419 Anxiety disorder, unspecified: Secondary | ICD-10-CM

## 2015-09-11 DIAGNOSIS — N760 Acute vaginitis: Secondary | ICD-10-CM

## 2015-09-11 MED ORDER — FLUCONAZOLE 150 MG PO TABS
150.0000 mg | ORAL_TABLET | Freq: Every day | ORAL | Status: AC
Start: 2015-09-11 — End: 2015-09-12

## 2015-09-11 MED ORDER — CLONAZEPAM 0.5 MG PO TABS
0.5000 mg | ORAL_TABLET | Freq: Two times a day (BID) | ORAL | Status: AC
Start: 2015-09-11 — End: 2015-09-14

## 2015-09-11 NOTE — Addendum Note (Signed)
Addended by: Johnell Comings on: 09/11/2015 04:25 PM     Modules accepted: Orders

## 2015-09-11 NOTE — Progress Notes (Signed)
Pt returns for repeat BP measurement and anxiety. Feeling somewhat better after taking 1 mg Klonopin. Has muscle aches entire body and complains of some sharp pain to the left of the sternum.  BP down to normal range,Chest exam has tenderness along left sternal border, Lungs are clear  EKG Normal  The pt. Is under a lot of stress due to moving today, she was given reassurance

## 2015-09-11 NOTE — Patient Instructions (Signed)
Treating Anxiety Disorders with Medication  An anxiety disorder can make you feel nervous or apprehensive,even without a clearreason. Certain anxiety disorders can cause intense feelings of fear or panic. You may even havephysical symptoms, such as a racing heartbeat or dizziness. If you have these feelings, you don't have to suffer anymore. Treatment to help you overcome your fears will likely include therapy (also called counseling). Medication may also be prescribed to help control your symptoms.    Medications  Certain medications may be prescribed to help control your symptoms. As a result, you may feel less anxious. You may also feel able to move forward with therapy. At first, medications and dosages may need to be adjusted to find what works best for you. Try to be patient. Tell your health care provider how a medication makes you feel. This way, you can work together to find the treatment that's best for you. Keep in mind that medications can have side effects. Talk to your provider about any side effects that are bothering you. Changing the dose or type of medication may help. Don't stop taking medication on your own because it can cause symptoms to come back.   Anti-anxiety medication:This medication eases symptoms and helps you relax. Your health care provider will explain when and how to use it. It may be prescribed for use before situations that makes you anxious. Or, you may be told to take it on a regular schedule. Anti-anxiety medication may make you feel a little sleepy or "out of it." Don't drive a car or operate machinery while on this medication, until you know how it affects you.     Antidepressant medication:This kind of medication is often used to treat anxiety, even if you aren't depressed. An antidepressant helps balance out brain chemicals. This helps keep anxiety under control. This medication is taken on a schedule. It takes a few weeks to start working. If you don't notice a  change at first, you may just need more time. But if you don't notice results after the first few weeks, tell your provider.  Keep taking medications as prescribed  Never change your dosage or stop taking your medications without talking to your health care provider first. Keep the following in mind:   Some medications must be taken on a schedule. Make this part of your daily routine. For instance, always take your pill before brushing your teeth. A pillbox can help you remember if you've taken your medication each day.   Medications are often taken for6 to 12 months. Your health care provider will then evaluate whether you need to stay on them. Many people who have also had therapy may no longer need medication to manage anxiety.   You may need to stop taking medication slowly to give your body time to adjust. When it's time to stop, your health care provider will tell you more. Remember: Never stop taking your medication without talking to your provider first.   If symptoms return, you may need to start taking medications again. This isn't your fault. It's just the nature of your anxiety disorder.     2000-2015 [employer] [address], [place] [address]. All rights reserved. This information is not intended as a substitute for professional medical care. Always follow your healthcare professional's instructions.

## 2015-09-11 NOTE — Addendum Note (Signed)
Addended by: Johnell Comings on: 09/11/2015 04:28 PM     Modules accepted: Orders

## 2015-09-11 NOTE — Progress Notes (Signed)
[facility]  PROGRESS NOTE     Patient: Monica Turner [id number]   Date: 09/11/2015   MRN: [id number]       Monica Turner is a 54 y.o. female      SUBJECTIVE     Chief Complaint   Patient presents with   . Muscle Pain     pt c/o entire left side pain and sciatica moved recently   . Anxiety     pt c/o chest discomfort Klonopin is locked in moving van   . Vaginitis     x 2-3 days vaginitis white discharge         Muscle Pain  This is a new problem. The problem occurs constantly. The problem has been waxing and waning since onset. The pain occurs in the context of recent physical stress. The pain is present in the upper back. The pain is mild. Pertinent negatives include no abdominal pain, chest pain, shortness of breath or weakness. Past treatments include nothing. There is no swelling present. She has been behaving normally. Her past medical history is significant for chronic back pain.   Anxiety  Patient reports no chest pain or shortness of breath.           Review of Systems   Constitutional: Negative.    Respiratory: Negative.  Negative for shortness of breath.    Cardiovascular: Negative.  Negative for chest pain.   Gastrointestinal: Negative for abdominal pain.   Musculoskeletal: Positive for back pain.   Skin: Negative.    Neurological: Negative for weakness.       The following portions of the patient's history were reviewed and updated as appropriate: Allergies, Current Medications, Past Family History, Past Medical history, Past social history, Past surgical history, and Problem List.    OBJECTIVE     Vitals   Filed Vitals:    09/11/15 1441   BP: 146/98   Pulse: 77   Temp: 98.8 F (37.1 C)   TempSrc: Tympanic   Resp: 20   Height: 1.676 m (5\' 6" )   Weight: 77.565 kg (171 lb)       Physical Exam   Constitutional: She appears well-developed and well-nourished.   Neck: Normal range of motion. Neck supple.   Cardiovascular: Normal rate, regular rhythm and normal heart sounds.    Pulmonary/Chest: Effort  normal and breath sounds normal.   Musculoskeletal: She exhibits tenderness.   Some tender muscles over left scapula   Skin: Skin is warm and dry.   Psychiatric: She has a normal mood and affect.       Lab Results (24 Hour)   Results     ** No results found for the last 24 hours. **          Radiology Results (24 Hour)     ** No results found for the last 24 hours. **          ASSESSMENT     Encounter Diagnoses   Name Primary?   Marland Kitchen Anxiety Yes   . Acute vaginitis           PLAN     Procedures    1. Anxiety    2. Acute vaginitis      An After Visit Summary was printed and given to the patient.      Signed,  Zigmund Gottron, MD  09/11/2015

## 2015-11-11 ENCOUNTER — Encounter (INDEPENDENT_AMBULATORY_CARE_PROVIDER_SITE_OTHER): Payer: Self-pay | Admitting: Family Medicine

## 2015-11-11 ENCOUNTER — Ambulatory Visit (INDEPENDENT_AMBULATORY_CARE_PROVIDER_SITE_OTHER): Admitting: Family Medicine

## 2015-11-11 VITALS — BP 102/71 | HR 75 | Temp 98.3°F | Resp 18 | Ht 66.0 in | Wt 177.0 lb

## 2015-11-11 DIAGNOSIS — J0191 Acute recurrent sinusitis, unspecified: Secondary | ICD-10-CM

## 2015-11-11 MED ORDER — AZITHROMYCIN 250 MG PO TABS
ORAL_TABLET | ORAL | Status: AC
Start: 2015-11-11 — End: 2015-11-16

## 2015-11-11 NOTE — Patient Instructions (Signed)
Sinusitis (Antibiotic Treatment)    The sinuses are air-filled spaces within the bones of the face. They connect to the inside of the nose.Sinusitisis an inflammation of the tissue lining the sinus cavity. Sinus inflammation can occur during a cold. It can also be due to allergies to pollens and other particles in the air. Sinusitis can cause symptoms of sinus congestion and fullness. A sinus infection causes fever, headache and facial pain. There is often green or yellow drainage from the nose or into the back of the throat (post-nasal drip). You have been given antibiotics to treat this condition.  Home care:   Take the full course of antibiotics as instructed. Do not stop taking them, even if you feel better.   Drink plenty of water, hot tea, and other liquids. This may help thin mucus. It also may promote sinus drainage.   Heat may help soothe painful areas of the face. Use a towel soaked in hot water. Or, stand in the shower and direct the hot spray onto your face. Using a vaporizer along with a menthol rub at night may also help.   Anexpectorantcontaining guaifenesin may help thin the mucus and promote drainage from the sinuses.   Over-the-counterdecongestantsmay be used unless a similar medicine was prescribed. Nasal sprays work the fastest. Use one that contains phenylephrine or oxymetazoline. First blow the nose gently. Then use the spray. Do not use these medicines more often than directed on the label or symptoms may get worse. You may also use tablets containing pseudoephedrine. Avoid products that combine ingredients, because side effects may be increased. Read labels. You can also ask the pharmacist for help. (NOTE:Persons with high blood pressure should not use decongestants. They can raise blood pressure.)   Over-the-counterantihistaminesmay help if allergies contributed to your sinusitis.    Do not use nasal rinses or irrigation during an acute sinus infection, unless told to by  your health care provider. Rinsing may spread the infection to other sinuses.   Use acetaminophen or ibuprofen to control pain, unless another pain medicine was prescribed. (If you have chronic liver or kidney disease or ever had a stomach ulcer, talk with your doctor before using these medicines. Aspirin should never be used in anyone under 18 years of age who is ill with a fever. It may cause severe liver damage.)   Don't smoke. This can worsen symptoms.  Follow-up care  Follow up with your healthcare provider or our staff if you are not improving within the next week.  When to seek medical advice  Call your healthcare provider if any of these occur:   Facial pain or headache becoming more severe   Stiff neck   Unusual drowsiness or confusion   Swelling of the forehead or eyelids   Vision problems, including blurred or double vision   Fever of100.4F (38C)or higher, or as directed by your healthcare provider   Seizure   Breathing problems   Symptoms not resolving within 10 days   2000-2015 [employer] [address], [place] [address]. All rights reserved. This information is not intended as a substitute for professional medical care. Always follow your healthcare professional's instructions.

## 2015-11-11 NOTE — Progress Notes (Signed)
[facility]  PROGRESS NOTE     Patient: Monica Turner   Date: 11/11/2015   MRN: [id number]       Monica Turner is a 55 y.o. female      SUBJECTIVE     Chief Complaint   Patient presents with   . URI     Patient presents with sinus pressure x 1 month. Sore throat started yesterday. States back and neck tightness and pain,  nasal congestion, sinus headache recently, Denies fever. No otc medications tried for sinus relief. Last dose of advil 600mg  today at 1130 hours.          URI   This is a new problem. The current episode started 1 to 4 weeks ago. The problem has been gradually worsening. There has been no fever. Associated symptoms include congestion, coughing, neck pain, rhinorrhea, sinus pain and a sore throat. Pertinent negatives include no abdominal pain, chest pain, diarrhea, ear pain, headaches, nausea or wheezing. She has tried antihistamine and decongestant for the symptoms.   has a Hx of recurrent sinusitis    Review of Systems   Constitutional: Negative for fever, chills and diaphoresis.   HENT: Positive for congestion, rhinorrhea and sore throat. Negative for ear pain.    Eyes: Negative for pain and discharge.   Respiratory: Positive for cough. Negative for shortness of breath and wheezing.    Cardiovascular: Negative for chest pain and palpitations.   Gastrointestinal: Negative for nausea, abdominal pain, diarrhea and constipation.   Musculoskeletal: Positive for back pain, arthralgias and neck pain.   Neurological: Negative for dizziness and headaches.   Psychiatric/Behavioral: The patient is nervous/anxious.        The following portions of the patient's history were reviewed and updated as appropriate: Allergies, Current Medications, Past Family History, Past Medical history, Past social history, Past surgical history, and Problem List.    OBJECTIVE     Vitals   Filed Vitals:    11/11/15 1231   BP: 102/71   Pulse: 75   Temp: 98.3 F (36.8 C)   TempSrc: Tympanic   Resp: 18    Height: 1.676 m (5\' 6" )   Weight: 80.287 kg (177 lb)       Physical Exam   Constitutional: She is oriented to person, place, and time. She appears well-developed.   HENT:   Head: Normocephalic and atraumatic.   Right Ear: Tympanic membrane normal.   Left Ear: Tympanic membrane normal.   Mouth/Throat: Posterior oropharyngeal erythema present. No posterior oropharyngeal edema.   Eyes: Conjunctivae are normal. Pupils are equal, round, and reactive to light.   Neck: Neck supple. No thyromegaly present.   Cardiovascular: Normal rate and regular rhythm.    No murmur heard.  Pulmonary/Chest: Effort normal and breath sounds normal.   Abdominal: Soft. There is no tenderness.   Lymphadenopathy:     She has no cervical adenopathy.   Neurological: She is alert and oriented to person, place, and time.   Skin: No rash noted.         ASSESSMENT     Encounter Diagnosis   Name Primary?   . Acute recurrent sinusitis, unspecified location Yes          PLAN     Procedures    1. Acute recurrent sinusitis, unspecified location  - azithromycin (ZITHROMAX Z-PAK) 250 MG tablet; Take 2 tablets (500 mg) on  Day 1,  followed by 1 tablet (250 mg) once daily on Days 2  through 5.  Dispense: 6 tablet; Refill: 0    An After Visit Summary was printed and given to the patient.      Signed,  Jannifer Hick, MD  11/11/2015

## 2015-11-15 ENCOUNTER — Other Ambulatory Visit: Payer: Self-pay

## 2015-12-05 ENCOUNTER — Encounter (INDEPENDENT_AMBULATORY_CARE_PROVIDER_SITE_OTHER): Payer: Self-pay | Admitting: Internal Medicine

## 2015-12-05 ENCOUNTER — Ambulatory Visit (INDEPENDENT_AMBULATORY_CARE_PROVIDER_SITE_OTHER): Admitting: Internal Medicine

## 2015-12-05 VITALS — BP 105/72 | HR 94 | Temp 99.0°F | Resp 14 | Ht 66.0 in | Wt 170.6 lb

## 2015-12-05 DIAGNOSIS — F489 Nonpsychotic mental disorder, unspecified: Secondary | ICD-10-CM

## 2015-12-05 MED ORDER — CYCLOBENZAPRINE HCL 5 MG PO TABS
5.0000 mg | ORAL_TABLET | Freq: Every evening | ORAL | Status: DC | PRN
Start: 2015-12-05 — End: 2016-09-11

## 2015-12-05 NOTE — Progress Notes (Signed)
Subjective:      Date: 12/05/2015 6:52 AM   Patient ID: Monica Turner is a 55 y.o. female.    Chief Complaint:  Chief Complaint   Patient presents with   . Anxiety       HPI:  Anxiety  Presents for follow-up visit. Symptoms include chest pain and muscle tension (shoulders, back - leads to muscle spasms). Patient reports no nausea, nervous/anxious behavior, palpitations or shortness of breath. Primary symptoms comment: "my anxiety used to be more mental now its more physical". Symptoms occur most days (4 days a week). The severity of symptoms is mild (to moderate). Exacerbated by: muscle tension  Hours of sleep per night: 11pm to 6 am  The quality of sleep is fair (trouble sleeping with night sweats due to menopause, night sweats are happening during the day too ). Nighttime awakenings: several (2-4 times a night).     Her past medical history is significant for anxiety/panic attacks (started in childhood) and depression. Past treatments include benzodiazephines. The treatment provided moderate relief. Compliance with prior treatments has been good.       Problem List:  Patient Active Problem List   Diagnosis   . H/O cosmetic surgery   . H/O cosmetic plastic surgery       Current Medications:  Current Outpatient Prescriptions   Medication Sig Dispense Refill   . buPROPion XL (WELLBUTRIN XL) 150 MG 24 hr tablet TAKE ONE TABLET BY MOUTH EVERY DAY  1   . clonazePAM (KLONOPIN) 0.5 MG tablet TAKE ONE TABLET BY MOUTH EVERY DAY  0   . cyclobenzaprine (FLEXERIL) 5 MG tablet Take 1 tablet (5 mg total) by mouth nightly as needed for Muscle spasms. 30 tablet 3   . DULoxetine (CYMBALTA) 30 MG capsule Take 30 mg by mouth daily.     . DULoxetine (CYMBALTA) 60 MG capsule Take 60 mg by mouth daily.        Marland Kitchen esomeprazole (NEXIUM) 40 MG capsule Take 40 mg by mouth every morning before breakfast.     . estradiol-norethindrone (COMBIPATCH) 0.05-0.14 MG/DAY Place 1 patch onto the skin twice a week. 24 patch 1   . fexofenadine  (ALLEGRA) 180 MG tablet Take 180 mg by mouth daily.     . montelukast (SINGULAIR) 10 MG tablet Take 1 tablet (10 mg total) by mouth nightly. 30 tablet 0   . Multiple Vitamin (MULTIVITAMIN) tablet Take 1 tablet by mouth daily.     . naproxen (NAPROSYN) 500 MG tablet Take 500 mg by mouth as needed.        . Probiotic Product (PROBIOTIC DAILY PO) Take 2 tablets by mouth daily.       No current facility-administered medications for this visit.     Facility-Administered Medications Ordered in Other Visits   Medication Dose Route Frequency Provider Last Rate Last Dose   . ceFAZolin (ANCEF) 1 g in sodium chloride 0.9 % 50 mL IVPB mini-bag plus  1 g Intravenous [facility] Katheren Puller, MD   1 g at 02/06/14 1610   . oxyCODONE-acetaminophen (PERCOCET) 5-325 MG per tablet 1 tablet  1 tablet Oral Q4H PRN Katheren Puller, MD           Allergies:  No Known Allergies    Past Medical History:  Past Medical History   Diagnosis Date   . Other plastic surgery for unacceptable cosmetic appearance    . Asthma without status asthmaticus    . Headache(784.0)  Sinus   . Gastroesophageal reflux disease    . Irritable bowel syndrome    . Low back pain    . Arthritis      Neck Right Hand   . Claustrophobia    . Depression    . Seasonal allergies    . Sciatica of left side    . Smokes with greater than 40 pack year history      Quit 2 weks ago.   . Sleep apnea        Past Surgical History:  Past Surgical History   Procedure Laterality Date   . Egd, colonoscopy  2015   . Abdominoplasty, liposuction (cosmetic)  2009   . Liposuction, neck (cosmetic)  2009   . Hysteroscopy, endometrial ablation, thermachoice  2015   . Cystoscopy, ureteroscopy  2015   . Facelift, (rhytidectomy) (cosmetic)  02/06/2014     Procedure: FACELIFT, (RHYTIDECTOMY) (COSMETIC);  Surgeon: Katheren Puller, MD;  Location: [facility] OR;  Service: Plastics;  Laterality: Bilateral;  RHYTIDECTOMY (COSMETIC)        Family History:  Family History   Problem  Relation Age of Onset   . Anesthesia problems Sister    . Heart disease Mother    . Heart disease Father    . Cancer Maternal Grandfather        Social History:  Social History     Social History   . Marital Status: Married     Spouse Name: N/A   . Number of Children: N/A   . Years of Education: N/A     Occupational History   . Not on file.     Social History Main Topics   . Smoking status: Current Some Day Smoker -- 1.00 packs/day for 40 years     Types: Cigarettes     Last Attempt to Quit: 12/17/2014   . Smokeless tobacco: Never Used   . Alcohol Use: No   . Drug Use: No   . Sexual Activity: Not Currently     Other Topics Concern   . Not on file     Social History Narrative       The following portions of the patient's history were reviewed and updated as appropriate: allergies, current medications, past family history, past medical history, past social history, past surgical history and problem list.    Vitals:  BP 105/72 mmHg  Pulse 94  Temp(Src) 99 F (37.2 C) (Oral)  Resp 14  Ht 1.676 m (5\' 6" )  Wt 77.384 kg (170 lb 9.6 oz)  BMI 27.55 kg/m2  SpO2 96%    ROS:  Review of Systems   Constitutional: Negative for fever and chills.   HENT: Negative for congestion and sore throat.    Eyes: Negative for blurred vision and double vision.   Respiratory: Negative for shortness of breath and wheezing.    Cardiovascular: Positive for chest pain. Negative for palpitations.   Gastrointestinal: Negative for nausea, vomiting, diarrhea and constipation.   Musculoskeletal: Negative for myalgias and joint pain.   Skin: Negative for itching and rash.   Neurological: Negative for headaches.   Psychiatric/Behavioral: Negative for depression. The patient is not nervous/anxious.      Objective:     Physical Exam:  Physical Exam   Constitutional: She is oriented to person, place, and time. She appears well-developed and well-nourished.   HENT:   Head: Normocephalic and atraumatic.   Right Ear: External ear normal.   Left Ear:  External ear normal.  Mouth/Throat: Oropharynx is clear and moist.   Eyes: EOM are normal. Pupils are equal, round, and reactive to light. No scleral icterus.   Neck: Neck supple. No JVD present. No thyromegaly present.   Cardiovascular: Normal rate, regular rhythm and normal heart sounds.    No murmur heard.  Pulmonary/Chest: Effort normal and breath sounds normal. No respiratory distress.   Musculoskeletal: She exhibits no edema.   Lymphadenopathy:     She has no cervical adenopathy.   Neurological: She is alert and oriented to person, place, and time.   Skin: Skin is warm and dry.   Psychiatric: She has a normal mood and affect. Her behavior is normal.   Nursing note and vitals reviewed.    Assessment/Plan:       1. Tension  - cyclobenzaprine (FLEXERIL) 5 MG tablet; Take 1 tablet (5 mg total) by mouth nightly as needed for Muscle spasms.  Dispense: 30 tablet; Refill: 3    Patient is a 55 y/o woman with a diagnosis of anxiety causing tension headaches here for routine follow up.  Patient reports being stable on current regimen.  They are not experiencing side effects to the medication.  WIll not adjust medications at this time.  Counseled on the need to use coping strategies to decrease the amount of medications they need and decrease the impact caused by life stressors.  Discussed exercise and sleep hygiene as the foundation of treatment.  Counseled to consider the use of a therapist as well.  Discussed her stress over not being able to fill benzodiazepines with all providers and advised to contact me if there is a problem      Gale Journey, MD

## 2015-12-09 ENCOUNTER — Encounter (INDEPENDENT_AMBULATORY_CARE_PROVIDER_SITE_OTHER): Payer: Self-pay | Admitting: Internal Medicine

## 2015-12-09 NOTE — Patient Instructions (Signed)
Treating Anxiety Disorders with Medication  An anxiety disorder can make you feel nervous or apprehensive,even without a clearreason. Certain anxiety disorders can cause intense feelings of fear or panic. You may even havephysical symptoms, such as a racing heartbeat or dizziness. If you have these feelings, you don't have to suffer anymore. Treatment to help you overcome your fears will likely include therapy (also called counseling). Medication may also be prescribed to help control your symptoms.    Medications  Certain medications may be prescribed to help control your symptoms. As a result, you may feel less anxious. You may also feel able to move forward with therapy. At first, medications and dosages may need to be adjusted to find what works best for you. Try to be patient. Tell your health care provider how a medication makes you feel. This way, you can work together to find the treatment that's best for you. Keep in mind that medications can have side effects. Talk to your provider about any side effects that are bothering you. Changing the dose or type of medication may help. Don't stop taking medication on your own because it can cause symptoms to come back.   Anti-anxiety medication:This medication eases symptoms and helps you relax. Your health care provider will explain when and how to use it. It may be prescribed for use before situations that makes you anxious. Or, you may be told to take it on a regular schedule. Anti-anxiety medication may make you feel a little sleepy or "out of it." Don't drive a car or operate machinery while on this medication, until you know how it affects you.  Caution  Never use alcohol or other drugs with anti-anxiety medications. This could result in loss of muscular control, sedation, coma or death. Also, use only the amount of medication prescribed for you. If you think you may have taken too much, get emergency care right away.    Antidepressant  medication:This kind of medication is often used to treat anxiety, even if you aren't depressed. An antidepressant helps balance out brain chemicals. This helps keep anxiety under control. This medication is taken on a schedule. It takes a few weeks to start working. If you don't notice a change at first, you may just need more time. But if you don't notice results after the first few weeks, tell your provider.  Keep taking medications as prescribed  Never change your dosage or stop taking your medications without talking to your health care provider first. Keep the following in mind:   Some medications must be taken on a schedule. Make this part of your daily routine. For instance, always take your pill before brushing your teeth. A pillbox can help you remember if you've taken your medication each day.   Medications are often taken for6 to 12 months. Your health care provider will then evaluate whether you need to stay on them. Many people who have also had therapy may no longer need medication to manage anxiety.   You may need to stop taking medication slowly to give your body time to adjust. When it's time to stop, your health care provider will tell you more. Remember: Never stop taking your medication without talking to your provider first.   If symptoms return, you may need to start taking medications again. This isn't your fault. It's just the nature of your anxiety disorder.  Special concerns   Side effects:Medications may cause side effects. Ask your health care provider or pharmacist what you can expect.   They may have ideas for avoiding some side effects.   Sexual problems:Some antidepressants can affect your desire for sex or your ability to have an orgasm. A change in dosage or medication often solves the problem. If you have a sexual side effect that concerns you, tell your health care provider.   Addiction:Antidepressants are not addictive. And if you've never had a problem with drugs or  alcohol, you likely won't have a problem with anti-anxiety medication. But if you have history of addiction, you mayneed to avoidthis medication.   Date Last Reviewed: 01/03/2014   2000-2016 [employer] [address], [place] [address]. All rights reserved. This information is not intended as a substitute for professional medical care. Always follow your healthcare professional's instructions.

## 2015-12-11 ENCOUNTER — Other Ambulatory Visit: Payer: Self-pay

## 2015-12-19 ENCOUNTER — Encounter (INDEPENDENT_AMBULATORY_CARE_PROVIDER_SITE_OTHER): Payer: Self-pay | Admitting: Cardiovascular Disease

## 2016-01-02 ENCOUNTER — Observation Stay
Admission: EM | Admit: 2016-01-02 | Discharge: 2016-01-03 | Disposition: A | Discharge status: Home / Self Care | Attending: Internal Medicine | Admitting: Internal Medicine

## 2016-01-02 ENCOUNTER — Observation Stay: Admitting: Internal Medicine

## 2016-01-02 ENCOUNTER — Emergency Department

## 2016-01-02 DIAGNOSIS — R079 Chest pain, unspecified: Principal | ICD-10-CM | POA: Insufficient documentation

## 2016-01-02 DIAGNOSIS — J45909 Unspecified asthma, uncomplicated: Secondary | ICD-10-CM | POA: Insufficient documentation

## 2016-01-02 DIAGNOSIS — K449 Diaphragmatic hernia without obstruction or gangrene: Secondary | ICD-10-CM | POA: Insufficient documentation

## 2016-01-02 DIAGNOSIS — K219 Gastro-esophageal reflux disease without esophagitis: Secondary | ICD-10-CM | POA: Insufficient documentation

## 2016-01-02 DIAGNOSIS — F17210 Nicotine dependence, cigarettes, uncomplicated: Secondary | ICD-10-CM | POA: Insufficient documentation

## 2016-01-02 DIAGNOSIS — R9431 Abnormal electrocardiogram [ECG] [EKG]: Secondary | ICD-10-CM | POA: Insufficient documentation

## 2016-01-02 DIAGNOSIS — Z8249 Family history of ischemic heart disease and other diseases of the circulatory system: Secondary | ICD-10-CM | POA: Insufficient documentation

## 2016-01-02 DIAGNOSIS — M47812 Spondylosis without myelopathy or radiculopathy, cervical region: Secondary | ICD-10-CM | POA: Insufficient documentation

## 2016-01-02 DIAGNOSIS — J302 Other seasonal allergic rhinitis: Secondary | ICD-10-CM | POA: Insufficient documentation

## 2016-01-02 DIAGNOSIS — G4733 Obstructive sleep apnea (adult) (pediatric): Secondary | ICD-10-CM | POA: Insufficient documentation

## 2016-01-02 DIAGNOSIS — R072 Precordial pain: Secondary | ICD-10-CM | POA: Diagnosis present

## 2016-01-02 DIAGNOSIS — F410 Panic disorder [episodic paroxysmal anxiety] without agoraphobia: Secondary | ICD-10-CM

## 2016-01-02 DIAGNOSIS — K589 Irritable bowel syndrome without diarrhea: Secondary | ICD-10-CM | POA: Insufficient documentation

## 2016-01-02 DIAGNOSIS — Z9114 Patient's other noncompliance with medication regimen: Secondary | ICD-10-CM | POA: Insufficient documentation

## 2016-01-02 DIAGNOSIS — M5442 Lumbago with sciatica, left side: Secondary | ICD-10-CM | POA: Insufficient documentation

## 2016-01-02 LAB — CBC AND DIFFERENTIAL
Basophils Absolute Automated: 0.03 10*3/uL (ref 0.00–0.20)
Basophils Automated: 0 %
Eosinophils Absolute Automated: 0.2 10*3/uL (ref 0.00–0.70)
Eosinophils Automated: 3 %
Hematocrit: 39.8 % (ref 37.0–47.0)
Hgb: 13.3 g/dL (ref 12.0–16.0)
Lymphocytes Absolute Automated: 2.28 10*3/uL (ref 0.50–4.40)
Lymphocytes Automated: 35 %
MCH: 31.5 pg (ref 28.0–32.0)
MCHC: 33.4 g/dL (ref 32.0–36.0)
MCV: 94.3 fL (ref 80.0–100.0)
MPV: 10.3 fL (ref 9.4–12.3)
Monocytes Absolute Automated: 0.65 10*3/uL (ref 0.00–1.20)
Monocytes: 10 %
Neutrophils Absolute: 3.31 10*3/uL (ref 1.80–8.10)
Neutrophils: 51 %
Platelets: 248 10*3/uL (ref 140–400)
RBC: 4.22 10*6/uL (ref 4.20–5.40)
RDW: 14 % (ref 12–15)
WBC: 6.47 10*3/uL (ref 3.50–10.80)

## 2016-01-02 LAB — COMPREHENSIVE METABOLIC PANEL
ALT: 21 U/L (ref 0–55)
AST (SGOT): 20 U/L (ref 5–34)
Albumin/Globulin Ratio: 1.4 (ref 0.9–2.2)
Albumin: 3.8 g/dL (ref 3.5–5.0)
Alkaline Phosphatase: 56 U/L (ref 37–106)
Anion Gap: 9 (ref 5.0–15.0)
BUN: 16 mg/dL (ref 7.0–19.0)
Bilirubin, Total: 0.3 mg/dL (ref 0.2–1.2)
CO2: 23 mEq/L (ref 22–29)
Calcium: 9 mg/dL (ref 8.5–10.5)
Chloride: 107 mEq/L (ref 100–111)
Creatinine: 0.8 mg/dL (ref 0.6–1.0)
Globulin: 2.8 g/dL (ref 2.0–3.6)
Glucose: 100 mg/dL (ref 70–100)
Potassium: 4.3 mEq/L (ref 3.5–5.1)
Protein, Total: 6.6 g/dL (ref 6.0–8.3)
Sodium: 139 mEq/L (ref 136–145)

## 2016-01-02 LAB — GFR: EGFR: >60

## 2016-01-02 LAB — TROPONIN I
Troponin I: <0.01 ng/mL (ref 0.00–0.09)
Troponin I: 0.01 ng/mL (ref 0.00–0.09)
Troponin I: <0.01 ng/mL (ref 0.00–0.09)

## 2016-01-02 MED ORDER — MORPHINE SULFATE 2 MG/ML IJ/IV SOLN (WRAP)
2.0000 mg | Status: DC | PRN
Start: 2016-01-02 — End: 2016-01-03

## 2016-01-02 MED ORDER — LIDOCAINE VISCOUS 2 % MT SOLN
5.0000 mL | Freq: Once | OROMUCOSAL | Status: AC
Start: 2016-01-02 — End: 2016-01-02
  Administered 2016-01-02: 5 mL via ORAL
  Filled 2016-01-02: qty 15

## 2016-01-02 MED ORDER — SODIUM CHLORIDE 0.9 % IV BOLUS
500.0000 mL | Freq: Once | INTRAVENOUS | Status: AC
Start: 2016-01-02 — End: 2016-01-02
  Administered 2016-01-02: 500 mL via INTRAVENOUS

## 2016-01-02 MED ORDER — OXYCODONE-ACETAMINOPHEN 5-325 MG PO TABS
1.0000 | ORAL_TABLET | Freq: Once | ORAL | Status: AC
Start: 2016-01-02 — End: 2016-01-02
  Administered 2016-01-02: 1 via ORAL
  Filled 2016-01-02: qty 1

## 2016-01-02 MED ORDER — INFLUENZA VAC SPLIT QUAD 0.5 ML IM SUSY
0.5000 mL | PREFILLED_SYRINGE | Freq: Once | INTRAMUSCULAR | Status: DC
Start: 2016-01-03 — End: 2016-01-03
  Filled 2016-01-02: qty 0.5

## 2016-01-02 MED ORDER — CLONAZEPAM 0.5 MG PO TABS
0.5000 mg | ORAL_TABLET | Freq: Three times a day (TID) | ORAL | Status: DC | PRN
Start: 2016-01-02 — End: 2016-01-03
  Administered 2016-01-03: 0.5 mg via ORAL
  Filled 2016-01-02: qty 1

## 2016-01-02 MED ORDER — ALUM & MAG HYDROXIDE-SIMETH 200-200-20 MG/5ML PO SUSP
30.0000 mL | ORAL | Status: DC | PRN
Start: 2016-01-02 — End: 2016-01-03
  Administered 2016-01-02 – 2016-01-03 (×2): 30 mL via ORAL
  Filled 2016-01-02 (×2): qty 30

## 2016-01-02 MED ORDER — DULOXETINE HCL 60 MG PO CPEP
60.0000 mg | ORAL_CAPSULE | Freq: Every day | ORAL | Status: DC
Start: 2016-01-03 — End: 2016-01-03
  Administered 2016-01-03: 60 mg via ORAL
  Filled 2016-01-02: qty 1

## 2016-01-02 MED ORDER — BUPROPION HCL ER (XL) 150 MG PO TB24
150.0000 mg | ORAL_TABLET | Freq: Every day | ORAL | Status: DC
Start: 2016-01-03 — End: 2016-01-03
  Administered 2016-01-03: 150 mg via ORAL
  Filled 2016-01-02: qty 1

## 2016-01-02 MED ORDER — PANTOPRAZOLE SODIUM 40 MG PO TBEC
40.0000 mg | DELAYED_RELEASE_TABLET | Freq: Every morning | ORAL | Status: DC
Start: 2016-01-03 — End: 2016-01-03
  Administered 2016-01-03: 40 mg via ORAL
  Filled 2016-01-02: qty 1

## 2016-01-02 NOTE — Plan of Care (Signed)
Problem: Safety  Goal: Patient will be free from injury during hospitalization  Outcome: Progressing    Problem: Pain  Goal: Patient's pain/discomfort is manageable  Outcome: Progressing    Problem: Psychosocial and Spiritual Needs  Goal: Demonstrates ability to cope with hospitalization/illness  Outcome: Progressing

## 2016-01-02 NOTE — ED Notes (Signed)
Patient reports mid chest pain started about 1215 pm after she gave her grandson a bath. Initiatlly mid chest pressure 10/10, patient went to rescue station and was brought in to ED. Patient also took 650 mg aspirin. EMS gave 3 SL nitros and the pressure came down to 2/10. Patient reports she had 3 episodes of SOB last week and also today at the time of chest pressure, currently denies any SOB. Patient AOx4 no distress noted

## 2016-01-02 NOTE — ED Provider Notes (Signed)
Physician/Midlevel provider first contact with patient: 01/02/16 1348         History     Chief Complaint   Patient presents with   . Chest Pain     HPI Comments: This 55 year old female presents with midsternal chest tightness which is a 2 out of 10.  Patient's been having symptoms on and off for the past month with some shortness of breath.  This episode started 12:15 today and continued.  He denies any headache, blurred vision, double vision, neck pain, jaw pain, numbness, tingling, arm pain or weakness.  Patient has some mild shortness of breath.  She was a smoker and has a family history of heart disease.  Her father has had bypass surgery and her mother has heart disease also.      Nothing seems to make this better or worse.    The history is provided by the patient.            Past Medical History   Diagnosis Date   . Other plastic surgery for unacceptable cosmetic appearance    . Asthma without status asthmaticus    . Headache(784.0)      Sinus   . Gastroesophageal reflux disease    . Irritable bowel syndrome    . Low back pain    . Arthritis      Neck Right Hand   . Claustrophobia    . Depression    . Seasonal allergies    . Sciatica of left side    . Smokes with greater than 40 pack year history      Quit 2 weks ago.   . Sleep apnea        Past Surgical History   Procedure Laterality Date   . Egd, colonoscopy  2015   . Abdominoplasty, liposuction (cosmetic)  2009   . Liposuction, neck (cosmetic)  2009   . Hysteroscopy, endometrial ablation, thermachoice  2015   . Cystoscopy, ureteroscopy  2015   . Facelift, (rhytidectomy) (cosmetic)  02/06/2014     Procedure: FACELIFT, (RHYTIDECTOMY) (COSMETIC);  Surgeon: Katheren Puller, MD;  Location: [facility] OR;  Service: Plastics;  Laterality: Bilateral;  RHYTIDECTOMY (COSMETIC)        Family History   Problem Relation Age of Onset   . Anesthesia problems Sister    . Heart disease Mother    . Heart disease Father    . Cancer Maternal Grandfather         Social  Social History   Substance Use Topics   . Smoking status: Current Some Day Smoker -- 0.00 packs/day for 40 years     Types: Cigarettes     Last Attempt to Quit: 12/17/2014   . Smokeless tobacco: Never Used   . Alcohol Use: No       .     No Known Allergies    Home Medications     Last Medication Reconciliation Action:  Complete Ethel Rana, RN 01/02/2016  1:28 PM                  buPROPion XL (WELLBUTRIN XL) 150 MG 24 hr tablet     TAKE ONE TABLET BY MOUTH EVERY DAY     clonazePAM (KLONOPIN) 0.5 MG tablet     TAKE ONE TABLET BY MOUTH EVERY DAY     cyclobenzaprine (FLEXERIL) 5 MG tablet     Take 1 tablet (5 mg total) by mouth nightly as needed for Muscle spasms.  DULoxetine (CYMBALTA) 30 MG capsule     Take 30 mg by mouth daily.     DULoxetine (CYMBALTA) 60 MG capsule     Take 60 mg by mouth daily.        esomeprazole (NEXIUM) 40 MG capsule     Take 40 mg by mouth every morning before breakfast.     estradiol-norethindrone (COMBIPATCH) 0.05-0.14 MG/DAY     Place 1 patch onto the skin twice a week.     fexofenadine (ALLEGRA) 180 MG tablet     Take 180 mg by mouth daily.     montelukast (SINGULAIR) 10 MG tablet     Take 1 tablet (10 mg total) by mouth nightly.     Probiotic Product (PROBIOTIC DAILY PO)     Take 2 tablets by mouth daily.                               Review of Systems   Constitutional: Negative for fever and diaphoresis.   HENT: Negative for congestion, ear pain and sore throat.    Eyes: Negative for pain and visual disturbance.   Respiratory: Positive for shortness of breath. Negative for cough, chest tightness and wheezing.    Cardiovascular: Positive for chest pain. Negative for palpitations and leg swelling.   Gastrointestinal: Negative for nausea, vomiting, abdominal pain, diarrhea and constipation.   Genitourinary: Negative for dysuria and urgency.   Musculoskeletal: Negative for back pain, gait problem and neck pain.   Skin: Negative for color change and rash.   Neurological:  Negative for dizziness, weakness and headaches.   Psychiatric/Behavioral: Negative for confusion. The patient is not nervous/anxious.        Physical Exam    BP: 101/57 mmHg, Heart Rate: 85, Temp: 98 F (36.7 C), Resp Rate: 19, SpO2: 98 %, Weight: 79.379 kg    Physical Exam   Constitutional: She is oriented to person, place, and time. She appears well-developed.   Increased BMI   HENT:   Head: Normocephalic and atraumatic.   Right Ear: External ear normal.   Left Ear: External ear normal.   Nose: Nose normal.   Mouth/Throat: Oropharynx is clear and moist. No oropharyngeal exudate.   Eyes: Conjunctivae are normal. Pupils are equal, round, and reactive to light. Right eye exhibits no discharge. Left eye exhibits no discharge. No scleral icterus.   Neck: Normal range of motion. Neck supple. No JVD present. No tracheal deviation present.   Cardiovascular: Normal rate, regular rhythm and normal heart sounds.    No murmur heard.  Pulmonary/Chest: Effort normal and breath sounds normal. She has no wheezes. She has no rales. She exhibits no tenderness.   Abdominal: Soft. Bowel sounds are normal. She exhibits no distension. There is no tenderness. There is no guarding.   Musculoskeletal: She exhibits no edema or tenderness.   Neurological: She is alert and oriented to person, place, and time. She has normal strength. No cranial nerve deficit. Coordination normal. GCS eye subscore is 4. GCS verbal subscore is 5. GCS motor subscore is 6.   Skin: Skin is warm and dry. She is not diaphoretic. No pallor.   Psychiatric: She has a normal mood and affect. Her behavior is normal. Judgment normal.   Nursing note and vitals reviewed.        MDM and ED Course     ED Medication Orders     Start Ordered     Status  Ordering Provider    01/02/16 1633 01/02/16 1633  alum & mag hydroxide-simethicone (MAALOX PLUS) 200-200-20 mg/5 mL suspension 30 mL   Every 4 hours PRN     Route: Oral  Ordered Dose: 30 mL     Acknowledged Cande Mastropietro     01/02/16 1412 01/02/16 1411  sodium chloride 0.9 % bolus 500 mL   Once     Route: Intravenous  Ordered Dose: 500 mL     Last MAR action:  Stopped Almer Bushey             MDM  Number of Diagnoses or Management Options  Abnormal EKG:   Precordial chest pain:   Diagnosis management comments: Diagnosis management comments: Oxygen saturation by pulse oximetry is 95%-100%, Normal.  Interventions: None Needed.      EKG Interpretation    EKG interpreted by Melvern Sample, DO  Time: 1410  Rate: Normal  Rhythm: sinus rhythm  Axis: Normal  ST Segments ant T waves: T-wave inversion V2 through V4.  Conduction: No blocks, but low voltage QRS  Impression: Non-specific EKG    Diff dx: CAD, angina, pneumonia, electrolyte disorder, chest wall pain, PE.    Heart Score         Value    History  1    EKG  0    Risk Factors  2    Total (with age)  4    Onset of pain (time of START of last episode of chest pain)?  0-3 hrs ago      Timing of repeat Troponin/EKG order  HEART Score exceeds limit for Chest   Pain Pathway      Aspirin taken prior to arrival at home    Multiple reexaminations: Symptoms improved.  His troponin negative.  EKG showing no acute process except for some inverted T waves V2, V3 and V4.    I have reviewed the nursing history.    I have reviewed all available xrays and find the following results: Chest x-ray-negative    This patient is at risk for decompensation due to chest pain with EKG changes and is at risk of death or permanent injury and will need to be observed in the hospital.    Dr. Krista Blue will accept.       DR. Melvern Sample  is the primary attending for this patient and has obtained and performed the history, PE, and medical decision making for this patient.      Results     Procedure Component Value Units Date/Time    Troponin I ([id number]) Collected:  01/02/16 1905    Specimen Information:  Blood Updated:  01/02/16 1942     Troponin I 0.01 ng/mL     Comprehensive metabolic panel ([id number]) Collected:  01/02/16  1420    Specimen Information:  Blood Updated:  01/02/16 1447     Glucose 100 mg/dL      BUN 78.2 mg/dL      Creatinine 0.8 mg/dL      Sodium 956 mEq/L      Potassium 4.3 mEq/L      Chloride 107 mEq/L      CO2 23 mEq/L      Calcium 9.0 mg/dL      Protein, Total 6.6 g/dL      Albumin 3.8 g/dL      AST (SGOT) 20 U/L      ALT 21 U/L      Alkaline Phosphatase 56 U/L      Bilirubin,  Total 0.3 mg/dL      Globulin 2.8 g/dL      Albumin/Globulin Ratio 1.4      Anion Gap 9.0     Troponin I ([id number]) Collected:  01/02/16 1420    Specimen Information:  Blood Updated:  01/02/16 1447     Troponin I <0.01 ng/mL     GFR ([id number]) Collected:  01/02/16 1420     EGFR >60.0 Updated:  01/02/16 1447    CBC with differential ([id number]) Collected:  01/02/16 1420    Specimen Information:  Blood from Blood Updated:  01/02/16 1425     WBC 6.47 x10 3/uL      Hgb 13.3 g/dL      Hematocrit 08.6 %      Platelets 248 x10 3/uL      RBC 4.22 x10 6/uL      MCV 94.3 fL      MCH 31.5 pg      MCHC 33.4 g/dL      RDW 14 %      MPV 10.3 fL      Neutrophils 51 %      Lymphocytes Automated 35 %      Monocytes 10 %      Eosinophils Automated 3 %      Basophils Automated 0 %      Immature Granulocyte Unmeasured %      Nucleated RBC Unmeasured /100 WBC      Neutrophils Absolute 3.31 x10 3/uL      Abs Lymph Automated 2.28 x10 3/uL      Abs Mono Automated 0.65 x10 3/uL      Abs Eos Automated 0.20 x10 3/uL      Absolute Baso Automated 0.03 x10 3/uL      Absolute Immature Granulocyte Unmeasured x10 3/uL         Radiology Results (24 Hour)     Procedure Component Value Units Date/Time    Chest 2 Views ([id number]) Collected:  01/02/16 1510    Order Status:  Completed Updated:  01/02/16 1514    Narrative:      EXAM: TWO-VIEW CHEST RADIOGRAPH    HISTORY: Chest pain    FINDINGS: Posteroanterior and lateral chest radiographs demonstrate  clear lungs. There is no pneumothorax or pleural effusion. The heart  size and mediastinal contours are within normal limits.  Visualized  osseous structures are grossly unremarkable.     Impression:       Clear chest radiographs    Sable Feil, MD   01/02/2016 3:10 PM         *This note was generated by the [employer] EMR system/ Dragon speech recognition and may contain inherent errors or omissions not intended by the user. Grammatical errors, random word insertions, deletions, pronoun errors and incomplete sentences are occasional consequences of this technology due to software limitations. Not all errors are caught or corrected. If there are questions or concerns about the content of this note or information contained within the body of this dictation they should be addressed directly with the author for clarification           Amount and/or Complexity of Data Reviewed  Clinical lab tests: reviewed and ordered  Tests in the radiology section of CPT: reviewed and ordered  Tests in the medicine section of CPT: ordered and reviewed          Heart Score         Value    History  1  EKG  0    Risk Factors  2    Total (with age)  4    Onset of pain (time of START of last episode of chest pain)?  0-3 hrs ago    Timing of repeat Troponin/EKG order  HEART Score exceeds limit for Chest Pain Pathway          Procedures    Clinical Impression & Disposition     Clinical Impression  Final diagnoses:   Precordial chest pain   Abnormal EKG        ED Disposition     Observation Admitting Physician: Camillo Flaming [[id number]]  Diagnosis: Precordial chest pain [186069]  Estimated Length of Stay: < 2 midnights  Tentative Discharge Plan?: Home or Self Care [1]  Patient Class: Observation [104]             Current Discharge Medication List                      Melvern Sample, DO  01/02/16 1956

## 2016-01-02 NOTE — H&P (Signed)
[facility] HOSPITALIST  H&P  Patient Info:   Date/Time: 01/02/2016 / 10:28 PM   Admit Date:01/02/2016  Patient Name:Monica Turner   ZOX:[id number]   PCP: Gale Journey, MD  Attending Physician:Yanixan Mellinger, Melina Copa, MD     Assessment and Plan:    Chest pain; most likely etiology at this point of time, appears to be panic attack.  We will rule out acute coronary syndrome in this patient.  She had a stress test done 2 years back which was negative.  No acute changes on EKG seen.  She does have hiatal hernia.  The symptoms are most likely secondary to gastroesophageal reflux disease and esophageal spasm.  Start the patient and gastrointestinal cocktail 30 mL by mouth every 6 hours when necessary.  Continue the patient on Klonopin for symptom management and she has extensive history of smoking with 40 pack years, for which she was advised to quit smoking.  She currently smokes only 3 cigarettes.  Both her mother and father had significant coronary artery disease and heart attacks.   Anxiety and depression for which she is on multiple psych medications which will be continued.   History of gastroesophageal reflux disease; continue the patient on Nexium.    DVT Prohylaxis:SEDs   Code Status: No Order  Disposition:home  Type of Admission:Observation  Estimated Length of Stay (including stay in the ER receiving treatment): Greater than 2 midnights  Medical Necessity for stay: Rule out acute coronary syndrome  Hospital Problems:   Active Problems:    Precordial chest pain    Clinical Presentation:   History of Presenting Illness:   Monica Turner is a 55 y.o. female who has history of   Past Surgical History   Procedure Laterality Date   . Egd, colonoscopy  2015   . Abdominoplasty, liposuction (cosmetic)  2009   . Liposuction, neck (cosmetic)  2009   . Hysteroscopy, endometrial ablation, thermachoice  2015   . Cystoscopy, ureteroscopy  2015   . Facelift, (rhytidectomy) (cosmetic)  02/06/2014      Procedure: FACELIFT, (RHYTIDECTOMY) (COSMETIC);  Surgeon: Katheren Puller, MD;  Location: [facility] OR;  Service: Plastics;  Laterality: Bilateral;  RHYTIDECTOMY (COSMETIC)     Past Medical History   Diagnosis Date   . Other plastic surgery for unacceptable cosmetic appearance    . Asthma without status asthmaticus    . Headache(784.0)      Sinus   . Gastroesophageal reflux disease    . Irritable bowel syndrome    . Low back pain    . Arthritis      Neck Right Hand   . Claustrophobia    . Depression    . Seasonal allergies    . Sciatica of left side    . Smokes with greater than 40 pack year history      Quit 2 weks ago.   . Sleep apnea     came with the chief complaint of Chest Pain   patient said that she was lying down on the.  And felt sudden onset of chest pain retrosternally and left anterior chest wall radiating to the back.  She described the pain as pressure and heart rates her pain 10 out of 10 in intensity.  She complains of shortness of breath on occasions when she climbs stairs.  Denies having any palpitations or shortness of breath during this episode.  Denies having any cough.  Patient does say that she still smokes 3 cigarettes.  She  has 40-pack-year smoking history.  Review of Systems:   Review of Systems   Constitutional: Negative for fever and chills.   HENT: Negative for sore throat.    Eyes: Negative for blurred vision and double vision.   Respiratory: Positive for shortness of breath. Negative for cough.    Cardiovascular: Positive for chest pain. Negative for palpitations.   Gastrointestinal: Positive for heartburn and abdominal pain. Negative for nausea and vomiting.   Genitourinary: Negative for dysuria and urgency.   Musculoskeletal: Negative for myalgias and neck pain.   Skin: Negative for itching and rash.   Neurological: Positive for headaches. Negative for dizziness and focal weakness.   Endo/Heme/Allergies: Negative for environmental allergies. Does not bruise/bleed easily.    Psychiatric/Behavioral: The patient is nervous/anxious. The patient does not have insomnia.      Physical Exam:     Filed Vitals:    01/02/16 1614 01/02/16 1715 01/02/16 1853 01/02/16 2208   BP: 100/66 95/54 120/75 113/68   Pulse: 72 66 70 72   Temp:   97.5 F (36.4 C) 97.7 F (36.5 C)   TempSrc:   Temporal Artery Temporal Artery   Resp:   16 16   Height:   1.676 m (5\' 6" )    Weight:   79.379 kg (175 lb)    SpO2: 97% 97% 100% 98%     Physical Exam   Constitutional: She is oriented to person, place, and time and well-developed, well-nourished, and in no distress. Vital signs are normal. She appears not lethargic, not dehydrated and not jaundiced. She does not have a sickly appearance. No distress.   HENT:   Head: Normocephalic and atraumatic.   Nose: Nose normal.   Mouth/Throat: Oropharynx is clear and moist. Mucous membranes are not pale, not dry and not cyanotic. Normal dentition.   Eyes: Conjunctivae and EOM are normal. Pupils are equal, round, and reactive to light. No scleral icterus.   Neck: Trachea normal and normal range of motion. Neck supple. No hepatojugular reflux and no JVD present. Carotid bruit is not present.   Cardiovascular: Normal rate, regular rhythm, S1 normal, S2 normal, normal heart sounds and normal pulses.  PMI is not displaced.    No murmur heard.  Pulmonary/Chest: Effort normal and breath sounds normal. No accessory muscle usage or stridor. No respiratory distress. She exhibits no tenderness.   Tenderness to palpation in the left anterior chest wall   Abdominal: Soft. Normal appearance and bowel sounds are normal. There is no hepatomegaly. There is no tenderness. There is no tenderness at McBurney's point and negative Murphy's sign.   Musculoskeletal: Normal range of motion.   Lymphadenopathy:     She has no cervical adenopathy.   Neurological: She is alert and oriented to person, place, and time. She has normal motor skills, normal sensation, normal reflexes and intact cranial nerves.  She appears not lethargic.   Skin: Skin is warm. She is not diaphoretic. No cyanosis. No pallor. Nails show no clubbing.   Psychiatric: Mood, memory, affect and judgment normal.   Nursing note and vitals reviewed.    Clinical Information and History:   Chief Complaint:  Chief Complaint   Patient presents with   . Chest Pain     Past Medical History:  Past Medical History   Diagnosis Date   . Other plastic surgery for unacceptable cosmetic appearance    . Asthma without status asthmaticus    . Headache(784.0)      Sinus   . Gastroesophageal  reflux disease    . Irritable bowel syndrome    . Low back pain    . Arthritis      Neck Right Hand   . Claustrophobia    . Depression    . Seasonal allergies    . Sciatica of left side    . Smokes with greater than 40 pack year history      Quit 2 weks ago.   . Sleep apnea      Past Surgical History:  Past Surgical History   Procedure Laterality Date   . Egd, colonoscopy  2015   . Abdominoplasty, liposuction (cosmetic)  2009   . Liposuction, neck (cosmetic)  2009   . Hysteroscopy, endometrial ablation, thermachoice  2015   . Cystoscopy, ureteroscopy  2015   . Facelift, (rhytidectomy) (cosmetic)  02/06/2014     Procedure: FACELIFT, (RHYTIDECTOMY) (COSMETIC);  Surgeon: Katheren Puller, MD;  Location: [facility] OR;  Service: Plastics;  Laterality: Bilateral;  RHYTIDECTOMY (COSMETIC)      Family History:  Family History   Problem Relation Age of Onset   . Anesthesia problems Sister    . Heart disease Mother    . Heart disease Father    . Cancer Maternal Grandfather      Social History:  History   Alcohol Use No     History   Drug Use No     History   Smoking status   . Current Some Day Smoker -- 0.00 packs/day for 40 years   . Types: Cigarettes   . Last Attempt to Quit: 12/17/2014   Smokeless tobacco   . Never Used     Social History     Social History   . Marital Status: Married     Spouse Name: N/A   . Number of Children: N/A   . Years of Education: N/A     Social History  Main Topics   . Smoking status: Current Some Day Smoker -- 0.00 packs/day for 40 years     Types: Cigarettes     Last Attempt to Quit: 12/17/2014   . Smokeless tobacco: Never Used   . Alcohol Use: No   . Drug Use: No   . Sexual Activity: Not Currently     Other Topics Concern   . None     Social History Narrative     Allergies:No Known Allergies  Medications:  Prescriptions prior to admission   Medication Sig Dispense Refill Last Dose   . buPROPion XL (WELLBUTRIN XL) 150 MG 24 hr tablet TAKE ONE TABLET BY MOUTH EVERY DAY  1 01/02/2016 at Unknown time   . clonazePAM (KLONOPIN) 0.5 MG tablet TAKE ONE TABLET BY MOUTH EVERY DAY  0 Past Week at Unknown time   . cyclobenzaprine (FLEXERIL) 5 MG tablet Take 1 tablet (5 mg total) by mouth nightly as needed for Muscle spasms. 30 tablet 3 Past Month at Unknown time   . DULoxetine (CYMBALTA) 30 MG capsule Take 30 mg by mouth daily.   01/02/2016 at Unknown time   . DULoxetine (CYMBALTA) 60 MG capsule Take 60 mg by mouth daily.      01/02/2016 at Unknown time   . esomeprazole (NEXIUM) 40 MG capsule Take 40 mg by mouth every morning before breakfast.   01/02/2016 at Unknown time   . estradiol-norethindrone (COMBIPATCH) 0.05-0.14 MG/DAY Place 1 patch onto the skin twice a week. 24 patch 1 01/02/2016 at Unknown time   . fexofenadine (ALLEGRA) 180 MG tablet  Take 180 mg by mouth daily.   01/02/2016 at Unknown time   . Probiotic Product (PROBIOTIC DAILY PO) Take 2 tablets by mouth daily.   01/02/2016 at Unknown time   . [DISCONTINUED] montelukast (SINGULAIR) 10 MG tablet Take 1 tablet (10 mg total) by mouth nightly. 30 tablet 0 01/02/2016 at Unknown time     Results of Labs/imaging   Labs have been reviewed:   Coagulation Profile:       CBC review:   Recent Labs  Lab 01/02/16  1420   WBC 6.47   HGB 13.3   HEMATOCRIT 39.8   PLATELETS 248   MCV 94.3   RDW 14   NEUTROPHILS 51   LYMPHOCYTES AUTOMATED 35   EOSINOPHILS AUTOMATED 3   IMMATURE GRANULOCYTE Unmeasured   NEUTROPHILS ABSOLUTE 3.31    ABSOLUTE IMMATURE GRANULOCYTE Unmeasured     Chem Review:  Recent Labs  Lab 01/02/16  1420   SODIUM 139   POTASSIUM 4.3   CHLORIDE 107   CO2 23   BUN 16.0   CREATININE 0.8   GLUCOSE 100   CALCIUM 9.0   BILIRUBIN, TOTAL 0.3   AST (SGOT) 20   ALT 21   ALKALINE PHOSPHATASE 56     Results     Procedure Component Value Units Date/Time    Troponin I [[id number]] Collected:  01/02/16 1905    Specimen Information:  Blood Updated:  01/02/16 1942     Troponin I 0.01 ng/mL     Comprehensive metabolic panel [[id number]] Collected:  01/02/16 1420    Specimen Information:  Blood Updated:  01/02/16 1447     Glucose 100 mg/dL      BUN 24.5 mg/dL      Creatinine 0.8 mg/dL      Sodium 809 mEq/L      Potassium 4.3 mEq/L      Chloride 107 mEq/L      CO2 23 mEq/L      Calcium 9.0 mg/dL      Protein, Total 6.6 g/dL      Albumin 3.8 g/dL      AST (SGOT) 20 U/L      ALT 21 U/L      Alkaline Phosphatase 56 U/L      Bilirubin, Total 0.3 mg/dL      Globulin 2.8 g/dL      Albumin/Globulin Ratio 1.4      Anion Gap 9.0     Troponin I [[id number]] Collected:  01/02/16 1420    Specimen Information:  Blood Updated:  01/02/16 1447     Troponin I <0.01 ng/mL     GFR [[id number]] Collected:  01/02/16 1420     EGFR >60.0 Updated:  01/02/16 1447    CBC with differential [[id number]] Collected:  01/02/16 1420    Specimen Information:  Blood from Blood Updated:  01/02/16 1425     WBC 6.47 x10 3/uL      Hgb 13.3 g/dL      Hematocrit 35.3 %      Platelets 248 x10 3/uL      RBC 4.22 x10 6/uL      MCV 94.3 fL      MCH 31.5 pg      MCHC 33.4 g/dL      RDW 14 %      MPV 10.3 fL      Neutrophils 51 %      Lymphocytes Automated 35 %      Monocytes 10 %  Eosinophils Automated 3 %      Basophils Automated 0 %      Immature Granulocyte Unmeasured %      Nucleated RBC Unmeasured /100 WBC      Neutrophils Absolute 3.31 x10 3/uL      Abs Lymph Automated 2.28 x10 3/uL      Abs Mono Automated 0.65 x10 3/uL      Abs Eos Automated 0.20 x10 3/uL      Absolute Baso  Automated 0.03 x10 3/uL      Absolute Immature Granulocyte Unmeasured x10 3/uL         Radiology reports have been reviewed:  Radiology Results (24 Hour)     Procedure Component Value Units Date/Time    Chest 2 Views [[id number]] Collected:  01/02/16 1510    Order Status:  Completed Updated:  01/02/16 1514    Narrative:      EXAM: TWO-VIEW CHEST RADIOGRAPH    HISTORY: Chest pain    FINDINGS: Posteroanterior and lateral chest radiographs demonstrate  clear lungs. There is no pneumothorax or pleural effusion. The heart  size and mediastinal contours are within normal limits. Visualized  osseous structures are grossly unremarkable.      Impression:       Clear chest radiographs    Sable Feil, MD   01/02/2016 3:10 PM          EKG: EKG reviewed .  No significant changes on EKG with noticed.  Hospitalist   Signed by:   Camillo Flaming  01/02/2016 10:28 PM    *This note was generated by the Epic EMR system/ Dragon speech recognition and may contain inherent errors or omissions not intended by the user. Grammatical errors, random word insertions, deletions, pronoun errors and incomplete sentences are occasional consequences of this technology due to software limitations. Not all errors are caught or corrected. If there are questions or concerns about the content of this note or information contained within the body of this dictation they should be addressed directly with the author for clarification

## 2016-01-03 DIAGNOSIS — R9431 Abnormal electrocardiogram [ECG] [EKG]: Secondary | ICD-10-CM | POA: Diagnosis present

## 2016-01-03 LAB — ECG 12-LEAD
Atrial Rate: 77 {beats}/min
Atrial Rate: 68 {beats}/min
Atrial Rate: 78 {beats}/min
P Axis: 48 degrees
P Axis: 49 degrees
P Axis: 55 degrees
P-R Interval: 140 ms
P-R Interval: 144 ms
P-R Interval: 148 ms
Q-T Interval: 388 ms
Q-T Interval: 394 ms
Q-T Interval: 390 ms
QRS Duration: 76 ms
QRS Duration: 72 ms
QRS Duration: 78 ms
QTC Calculation (Bezet): 439 ms
QTC Calculation (Bezet): 418 ms
QTC Calculation (Bezet): 444 ms
R Axis: 9 degrees
R Axis: 34 degrees
R Axis: 27 degrees
T Axis: 31 degrees
T Axis: 45 degrees
T Axis: 46 degrees
Ventricular Rate: 77 {beats}/min
Ventricular Rate: 68 {beats}/min
Ventricular Rate: 78 {beats}/min

## 2016-01-03 MED ORDER — ALUM & MAG HYDROXIDE-SIMETH 200-200-20 MG/5ML PO SUSP
30.0000 mL | Freq: Four times a day (QID) | ORAL | Status: DC | PRN
Start: 2016-01-03 — End: 2016-09-11

## 2016-01-03 NOTE — Discharge Summary (Signed)
Reed Pandy HOSPITALIST   [place] Summary   Patient Info:   Date/Time: 01/03/2016 / 11:49 AM   Admit Date:01/02/2016  Patient Name:Monica Turner   ZOX:[id number]   PCP: Gale Journey, MD  Attending Physician:Lunell Robart, Melina Copa, MD     Hospital Course:   Please see H&P for complete details of HPI and ROS. The patient was admitted to [facility] and has been diagnosed with Hospital Problems:  Active Problems:    Precordial chest pain    Abnormal EKG   and has been taken care as mentioned below.     Chest pain; most likely etiology at this point of time, appears to be esophageal spasm and gastroesophageal reflux disease leading to panic attack. We have ruled out acute coronary syndrome in this patient with negative cardiac enzymes She had a stress test done 2 years back which was negative. Patient was advised to follow up with Dr. Natasha Bence as an outpatient. She does have hiatal hernia. The symptoms are most likely secondary to gastroesophageal reflux disease and esophageal spasm. I will continue the patient on gastrointestinal cocktail 30 mL by mouth every 6 hours when necessary. Continue the patient on Klonopin for symptom management and she has extensive history of smoking with 40 pack years, for which she was advised to quit smoking. She currently smokes only 3 cigarettes. Both her mother and father had significant coronary artery disease and heart attacks.   Anxiety and depression for which she is on multiple psych medications which will be continued.  History of gastroesophageal reflux disease; continue the patient on Nexium.  We will add gastrointestinal cocktail as needed for when necessary symptoms     Admission Date:01/02/2016  Discharge Date: 01/03/2016  Type of Admission:Observation  Code Status: No Order  Subjective at the time of discharge:   Patient states her symptoms are much better today  Chief Complaint:  Chest Pain    Objective:     Filed Vitals:    01/02/16 2208 01/03/16 0210  01/03/16 0605 01/03/16 0935   BP: 113/68 98/60 96/53  106/69   Pulse: 72 74 65 72   Temp: 97.7 F (36.5 C) 98.2 F (36.8 C) 98.3 F (36.8 C) 97.7 F (36.5 C)   TempSrc: Temporal Artery Temporal Artery Temporal Artery Temporal Artery   Resp: 16 16 16 16    Height:       Weight:       SpO2: 98% 97% 96% 96%     Physical Exam:   Physical Exam   Cardiovascular: Normal rate.    Pulmonary/Chest: Effort normal. No respiratory distress.   Abdominal: Soft. Bowel sounds are normal.     Clinical Presentation:   History of Presenting Illness: Please refer to HPI in the Detailed H&P  Discharge Diagnosis and Instructions:   Hospital Problems:Active Problems:    Precordial chest pain    Abnormal EKG    Lists the present on admission hospital problems:Present on Admission:   . Precordial chest pain  . Abnormal EKG  Consultants:Plan None  Discharge Medications:   Discharge Medications:      Discharge Medication List      Taking          alum & mag hydroxide-simethicone 200-200-20 mg/5 mL suspension   Dose:  30 mL   Commonly known as:  MAALOX PLUS   Take 30 mLs by mouth every 6 (six) hours as needed for Indigestion.       buPROPion XL 150 MG 24 hr tablet  Commonly known as:  WELLBUTRIN XL   TAKE ONE TABLET BY MOUTH EVERY DAY       clonazePAM 0.5 MG tablet   Commonly known as:  KlonoPIN   TAKE ONE TABLET BY MOUTH EVERY DAY       cyclobenzaprine 5 MG tablet   Dose:  5 mg   Commonly known as:  FLEXERIL   Take 1 tablet (5 mg total) by mouth nightly as needed for Muscle spasms.       * DULoxetine 30 MG capsule   Dose:  30 mg   Commonly known as:  CYMBALTA   Take 30 mg by mouth daily.       * DULoxetine 60 MG capsule   Dose:  60 mg   Commonly known as:  CYMBALTA   Take 60 mg by mouth daily.       esomeprazole 40 MG capsule   Dose:  40 mg   Commonly known as:  NexIUM   Take 40 mg by mouth every morning before breakfast.       estradiol-norethindrone 0.05-0.14 MG/DAY   Dose:  1 patch   Commonly known as:  COMBIPATCH   Place 1 patch onto  the skin twice a week.       fexofenadine 180 MG tablet   Dose:  180 mg   Commonly known as:  ALLEGRA   Take 180 mg by mouth daily.       PROBIOTIC DAILY PO   Dose:  2 tablet   Take 2 tablets by mouth daily.       * Notice:  This list has 2 medication(s) that are the same as other medications prescribed for you. Read the directions carefully, and ask your doctor or other care provider to review them with you.        Follow up recommendations:   Follow up:         Follow-up Information     Follow up with Dondlinger, Odis Hollingshead, MD In 1 week.    Specialty:  Internal Medicine    Contact information:    [address] [facility] [place] [address]  [phone]          Follow up with Elbert Ewings, MD In 3 days.    Specialties:  Interventional Cardiology, Cardiology, Critical Care Medicine    Contact information:    [address]  [place] [address]  [phone]           Results of Labs/imaging:   Labs have been reviewed:   Coagulation Profile:       CBC review:   Recent Labs  Lab 01/02/16  1420   WBC 6.47   HGB 13.3   HEMATOCRIT 39.8   PLATELETS 248   MCV 94.3   RDW 14   NEUTROPHILS 51   LYMPHOCYTES AUTOMATED 35   EOSINOPHILS AUTOMATED 3   IMMATURE GRANULOCYTE Unmeasured   NEUTROPHILS ABSOLUTE 3.31   ABSOLUTE IMMATURE GRANULOCYTE Unmeasured     Chem Review:  Recent Labs  Lab 01/02/16  1420   SODIUM 139   POTASSIUM 4.3   CHLORIDE 107   CO2 23   BUN 16.0   CREATININE 0.8   GLUCOSE 100   CALCIUM 9.0   BILIRUBIN, TOTAL 0.3   AST (SGOT) 20   ALT 21   ALKALINE PHOSPHATASE 56     Results     Procedure Component Value Units Date/Time    Troponin I [[id number]] Collected:  01/02/16  2233    Specimen Information:  Blood Updated:  01/02/16 2310     Troponin I <0.01 ng/mL     Troponin I [[id number]] Collected:  01/02/16 1905    Specimen Information:  Blood Updated:  01/02/16 1942     Troponin I 0.01 ng/mL     Comprehensive metabolic panel [[id number]] Collected:  01/02/16 1420    Specimen Information:  Blood Updated:   01/02/16 1447     Glucose 100 mg/dL      BUN 86.5 mg/dL      Creatinine 0.8 mg/dL      Sodium 784 mEq/L      Potassium 4.3 mEq/L      Chloride 107 mEq/L      CO2 23 mEq/L      Calcium 9.0 mg/dL      Protein, Total 6.6 g/dL      Albumin 3.8 g/dL      AST (SGOT) 20 U/L      ALT 21 U/L      Alkaline Phosphatase 56 U/L      Bilirubin, Total 0.3 mg/dL      Globulin 2.8 g/dL      Albumin/Globulin Ratio 1.4      Anion Gap 9.0     Troponin I [[id number]] Collected:  01/02/16 1420    Specimen Information:  Blood Updated:  01/02/16 1447     Troponin I <0.01 ng/mL     GFR [[id number]] Collected:  01/02/16 1420     EGFR >60.0 Updated:  01/02/16 1447    CBC with differential [[id number]] Collected:  01/02/16 1420    Specimen Information:  Blood from Blood Updated:  01/02/16 1425     WBC 6.47 x10 3/uL      Hgb 13.3 g/dL      Hematocrit 34.7 %      Platelets 248 x10 3/uL      RBC 4.22 x10 6/uL      MCV 94.3 fL      MCH 31.5 pg      MCHC 33.4 g/dL      RDW 14 %      MPV 10.3 fL      Neutrophils 51 %      Lymphocytes Automated 35 %      Monocytes 10 %      Eosinophils Automated 3 %      Basophils Automated 0 %      Immature Granulocyte Unmeasured %      Nucleated RBC Unmeasured /100 WBC      Neutrophils Absolute 3.31 x10 3/uL      Abs Lymph Automated 2.28 x10 3/uL      Abs Mono Automated 0.65 x10 3/uL      Abs Eos Automated 0.20 x10 3/uL      Absolute Baso Automated 0.03 x10 3/uL      Absolute Immature Granulocyte Unmeasured x10 3/uL         Radiology reports have been reviewed:  Radiology Results (24 Hour)     Procedure Component Value Units Date/Time    Chest 2 Views [[id number]] Collected:  01/02/16 1510    Order Status:  Completed Updated:  01/02/16 1514    Narrative:      EXAM: TWO-VIEW CHEST RADIOGRAPH    HISTORY: Chest pain    FINDINGS: Posteroanterior and lateral chest radiographs demonstrate  clear lungs. There is no pneumothorax or pleural effusion. The heart  size and mediastinal contours are within normal limits.  Visualized  osseous structures  are grossly unremarkable.      Impression:       Clear chest radiographs    Sable Feil, MD   01/02/2016 3:10 PM          Chest 2 Views    01/02/2016  EXAM: TWO-VIEW CHEST RADIOGRAPH HISTORY: Chest pain FINDINGS: Posteroanterior and lateral chest radiographs demonstrate clear lungs. There is no pneumothorax or pleural effusion. The heart size and mediastinal contours are within normal limits. Visualized osseous structures are grossly unremarkable.     01/02/2016   Clear chest radiographs Sable Feil, MD 01/02/2016 3:10 PM     Pathology:   Specimens     None        Pending Lab Results:   Labs/Images to be followed at your PCP office: Unresulted Labs     None         Hospitalist:   Signed by: Camillo Flaming  01/03/2016 11:49 AM      *This note was generated by the Epic EMR system/ Dragon speech recognition and may contain inherent errors or omissions not intended by the user. Grammatical errors, random word insertions, deletions, pronoun errors and incomplete sentences are occasional consequences of this technology due to software limitations. Not all errors are caught or corrected. If there are questions or concerns about the content of this note or information contained within the body of this dictation they should be addressed directly with the author for clarification

## 2016-01-03 NOTE — Discharge Instructions (Signed)
Uncertain Causes of Chest Pain    Chest pain can happen for a number of reasons. Sometimes the cause can't be determined. If yourcondition does not seem serious, and your pain does not appear to be coming from your heart, your healthcare provider may recommend watching it closely. Sometimes the signs of a serious problem take more time to appear. Many problems not related to your heart can cause chest pain.These include:   Musculoskeletal. Costochondritis, an inflammation of the tissues around the ribs that can occur from trauma or overuse injuries   Respiratory. Pneumonia, pneumothorax, or pneumonitis (inflammation of the lining of the chest and lungs)   Gastrointestinal. Esophageal reflux, heartburn, or gallbladder disease   Anxiety and panic disorders   Nerve compression and neuritis   Miscellaneous problems such as aortic aneurysm or pulmonary embolism (a blood clot in the lungs)  Home care  After your visit, follow these recommendations:   Rest today and avoid strenuous activity.   Take any prescribed medicine as directed.   Be aware of any recurrent chest pain and notice any changes  Follow-up care  Follow up with your healthcare provider if you do not start to feel better within 24 hours, or as advised.  Call 911  Call 911 if any of these occur:   A change in the type of pain: if it feels different, becomes more severe, lasts longer, or begins to spread into your shoulder, arm, neck, jaw or back   Shortness of breath or increased pain with breathing   Weakness, dizziness, or fainting   Rapid heart beat   Crushing sensation in your chest  When to seek medical advice  Call your healthcare provider right away if any of the following occur:   Cough with dark colored sputum (phlegm) or blood   Fever of 100.54F(38C) or higher, or as directed by your healthcare provider   Swelling, pain or redness in one leg   Shortness of breath  Date Last Reviewed: 10/08/2014   2000-2016 [employer] [address] [place], [place] [address]. All rights reserved. This information is not intended as a substitute for professional medical care. Always follow your healthcare professional's instructions.      Aluminum Hydroxide, Magnesium Hydroxide Oral suspension  What is this medicine?  ALUMINUM HYDROXIDE; MAGNESIUM HYDROXIDE (a LOO mi num hye DROX ide; mag NEE zee um hye DROX ide) is an antacid. It is used to relieve the symptoms of indigestion, heartburn, gastroesophageal reflux disorder (GERD), or stomach or duodenal ulcers.  This medicine may be used for other purposes; ask your health care provider or pharmacist if you have questions.  What should I tell my health care provider before I take this medicine?  They need to know if you have any of these conditions:   bowel, intestinal, or stomach disease   constipation   diarrhea   kidney disease   liver disease   on a sodium (salt) restricted diet   stomach bleeding or obstruction   an unusual or allergic reaction to aluminum hydroxide, magnesium hydroxide or other antacids, foods, dyes, or preservatives   pregnant or trying to get pregnant   breast-feeding  How should I use this medicine?  Take this medicine by mouth. Follow the directions on the label. Shake well before using. Use a specially marked spoon or container to measure your medicine. Ask your pharmacist if you do not have one. Household spoons are not accurate. Antacids are usually taken after meals and  at bedtime or as directed by your doctor or health care professional. After taking the medication, drink a full glass of water. Take your doses at regular intervals. Do not take your medicine more often than directed.  Talk to your pediatrician regarding the use of this medicine in children. While this medicine may be used in children for selected conditions, precautions do apply.  Overdosage: If you think you have taken too much of this medicine contact a poison control center  or emergency room at once.  NOTE: This medicine is only for you. Do not share this medicine with others.  What if I miss a dose?  If you miss a dose, take it as soon as you can. If it is almost time for your next dose, take only that dose. Do not take double or extra doses.  What may interact with this medicine?   amphetamine   antibiotics   captopril   delavirdine   gabapentin   heart medicines, such as digoxin or digitoxin   hyoscyamine   iron salts   isoniazid   medicines for breathing difficulties like ipratropium and tiotropium   medicines for diabetes   medicines for fungal infections like itraconazole and ketoconazole   medicines for osteoporosis like alendronate, etidronate, risedronate and tiludronate   medicines for overactive bladder like oxybutinin and tolterodine   medicines for seizures like ethotoin and phenytoin   methenamine   mycophenolate   pancrelipase   penicillamine   phenothiazines like chlorpromazine, mesoridazine, prochlorperazine, thioridazine   quinidine   rosuvastatin   sodium fluoride   sodium polystyrene sulfonate   sotalol   sucralfate   tacrolimus   thyroid hormones like levothyroxine   ursodiol   vitamin D   zalcitabine  This list may not describe all possible interactions. Give your health care provider a list of all the medicines, herbs, non-prescription drugs, or dietary supplements you use. Also tell them if you smoke, drink alcohol, or use illegal drugs. Some items may interact with your medicine.  What should I watch for while using this medicine?  Tell your doctor or healthcare professional if your symptoms do not start to get better or if they get worse. Do not treat yourself for stomach problems with this medicine for more than one week. See a doctor if you have black tarry stools, rectal bleeding, or if you feel unusually tired. Do not change to another antacid product without advice.  If you are taking other medicines, leave an interval of at  least 2 hours before or after taking this medicine.  To help reduce constipation, drink several glasses of water a day.  What side effects may I notice from receiving this medicine?  Side effects that you should report to your doctor or health care professional as soon as possible:   allergic reactions like skin rash, itching or hives, swelling of the face, lips, or tongue   bone or joint aches and pains   confusion or irritability   headache   loss of appetite   nausea, vomiting   unusually weak or tired  Side effects that usually do not require medical attention (report to your doctor or health care professional if they continue or are bothersome):   chalky taste   constipation   diarrhea   hemorrhoids  This list may not describe all possible side effects. Call your doctor for medical advice about side effects. You may report side effects to FDA at 1-800-FDA-1088.  Where should I keep my  medicine?  Keep out of the reach of children.  Store at room temperature between 15 and 30 degrees C (59 and 86 degrees F). Do not freeze. Protect from light and moisture. Throw away any unused medicine after the expiration date.  NOTE:This sheet is a summary. It may not cover all possible information. If you have questions about this medicine, talk to your doctor, pharmacist, or health care provider. Copyright 2015 [employer]

## 2016-01-03 NOTE — UM Notes (Signed)
Patient comes into the ED on 01/02/16, admitted to [facility]BS same day. DX: Chest pain.       [name]-patient said that she was lying down on the. And felt sudden onset of chest pain retrosternally and left anterior chest wall radiating to the back. She described the pain as pressure and heart rates her pain 10 out of 10 in intensity. She complains of shortness of breath on occasions when she climbs stairs. Denies having any palpitations or shortness of breath during this episode. Denies having any cough. Patient does say that she still smokes 3 cigarettes. She has 40-pack-year smoking history.    Past Medical History   Diagnosis Date   . Other plastic surgery for unacceptable cosmetic appearance    . Asthma without status asthmaticus    . Headache(784.0)      Sinus   . Gastroesophageal reflux disease    . Irritable bowel syndrome    . Low back pain    . Arthritis      Neck Right Hand   . Claustrophobia    . Depression    . Seasonal allergies    . Sciatica of left side    . Smokes with greater than 40 pack year history      Quit 2 weks ago.   . Sleep apnea      Past Surgical History   Procedure Laterality Date   . Egd, colonoscopy  2015   . Abdominoplasty, liposuction (cosmetic)  2009   . Liposuction, neck (cosmetic)  2009   . Hysteroscopy, endometrial ablation, thermachoice  2015   . Cystoscopy, ureteroscopy  2015   . Facelift, (rhytidectomy) (cosmetic)  02/06/2014     Procedure: FACELIFT, (RHYTIDECTOMY) (COSMETIC);  Surgeon: Katheren Puller, MD;  Location: [facility] OR;  Service: Plastics;  Laterality: Bilateral;  RHYTIDECTOMY (COSMETIC)        Medications   alum & mag hydroxide-simethicone (MAALOX PLUS) 200-200-20 mg/5 mL suspension 30 mL (30 mLs Oral Given 01/03/16 0608)   morphine injection 2 mg (not administered)   influenza quadrivalent-split vaccine (PF) (FLUARIX/FLULAVAL/FLUZONE) IM injection 0.5 mL (0.5 mLs Intramuscular Not Given 01/03/16 0935)   clonazePAM (KlonoPIN) tablet 0.5 mg (0.5 mg Oral  Given 01/03/16 0033)   pantoprazole (PROTONIX) EC tablet 40 mg (40 mg Oral Given 01/03/16 0935)   buPROPion XL (WELLBUTRIN XL) 24 hr tablet 150 mg (150 mg Oral Given 01/03/16 0935)   DULoxetine (CYMBALTA) DR capsule 60 mg (60 mg Oral Given 01/03/16 0935)   sodium chloride 0.9 % bolus 500 mL (0 mLs Intravenous Stopped 01/02/16 1507)   oxyCODONE-acetaminophen (PERCOCET) 5-325 MG per tablet 1 tablet (1 tablet Oral Given 01/02/16 2145)   lidocaine viscous (XYLOCAINE) 2 % mouth solution 5 mL (5 mLs Swish & Spit Given 01/02/16 2145)             ----------Admit to OBS.  Assessment and Plan:    Chest pain; most likely etiology at this point of time, appears to be panic attack. We will rule out acute coronary syndrome in this patient. She had a stress test done 2 years back which was negative. No acute changes on EKG seen. She does have hiatal hernia. The symptoms are most likely secondary to gastroesophageal reflux disease and esophageal spasm. Start the patient and gastrointestinal cocktail 30 mL by mouth every 6 hours when necessary. Continue the patient on Klonopin for symptom management and she has extensive history of smoking with 40 pack years, for which she was  advised to quit smoking. She currently smokes only 3 cigarettes. Both her mother and father had significant coronary artery disease and heart attacks.   Anxiety and depression for which she is on multiple psych medications which will be continued.   History of gastroesophageal reflux disease; continue the patient on Nexium.    DVT Prohylaxis:SEDs   Code Status: No Order  Disposition:home  Type of Admission:Observation  Estimated Length of Stay (including stay in the ER receiving treatment): Greater than 2 midnights  Medical Necessity for stay: Rule out acute coronary syndrome             Current Facility-Administered Medications   Medication Dose Route Frequency   . buPROPion XL  150 mg Oral Daily   . DULoxetine  60 mg Oral Daily   . influenza  0.5 mL  Intramuscular Once   . pantoprazole  40 mg Oral QAM AC     Current Facility-Administered Medications   Medication Dose Route Frequency Last Rate     Current Facility-Administered Medications   Medication Dose Route   . alum & mag hydroxide-simethicone  30 mL Oral   . clonazePAM  0.5 mg Oral   . morphine  2 mg Intravenous     Arlana Hove RN,BSN  Utilization Review Clinical Case Manager  Case Management Department  [facility]  [address]  [place] [phone]  T [phone] F [phone]   TAX ID: [id number]  NPI:[id number]

## 2016-01-03 NOTE — Plan of Care (Signed)
Problem: Health Promotion  Goal: Vaccination Screening  All patients will be screened for current vaccination status on each admission.   Outcome: Completed Date Met:  01/03/16  The flu vaccine is not available at [facility]  Goal: Knowledge - disease process  Extent of understanding conveyed about a specific disease process.   Outcome: Progressing  Goal: Risk control - tobacco abuse  Actions to eliminate or reduce tobacco use.   Outcome: Progressing  Smoke cessation ed materials provided.   Goal: Knowledge - health resources  Extent of understanding and conveyed about healthcare resources.   Outcome: Progressing    Problem: Safety  Goal: Patient will be free from injury during hospitalization  Outcome: Progressing    Problem: Pain  Goal: Patient's pain/discomfort is manageable  Outcome: Progressing    Problem: Psychosocial and Spiritual Needs  Goal: Demonstrates ability to cope with hospitalization/illness  Outcome: Progressing

## 2016-01-03 NOTE — Consults (Signed)
[facility] CONSULTATION REPORT    Date Time: 01/03/2016 10:42 AM  Patient Name: Monica Turner  Requesting Physician: Camillo Flaming, MD       Reason for Consultation:   Chest pain      History:   Monica Turner is a 55 y.o. female who presents to the hospital on 01/02/2016 with  Who was sitting at her desk on her telephone when she had a sudden episode  of severe midsternal chest pain, and it came out of the blue.  It lasted  for about 45 minutes.  She was diaphoretic and could not get comfortable in  any position, at which point she decided to drive to the hospital.  On the  way, she was near [facility] emergency room from [place] and went into  the local ER because the pain was so intense.  Overnight, she was brought  in for chest pain observation and was found to have no significant EKG  changes, and 3 sets of cardiac enzymes were normal, and the patient is now  being considered for inpatient or outpatient further workup.     The patient has a history of father with coronary artery disease and MI at  age 83 and subsequently 6 MIs but living and doing well at age 44 after a  bypass surgery.  Mother is also with coronary bypass surgery.  Of note, the  patient herself had a heart scan which calcium score of 0, maybe 8 to 10  years ago, and a stress test 2 years ago that was also normal with Dr.  Alanson Aly.  I am covering Dr. Alanson Aly for this weekend.     The patient also has a history of hiatal hernia with somewhat constant  heaviness in the epigastrium and midsternum.  She takes Protonix  constantly, and retrospectively, she thinks she took a bunch of  multivitamins, did not have breakfast, and this might have triggered her  GERD symptoms.    The patient also has a history of sleep apnea and is noncompliant with her  CPAP machine.  She wakes up at times with panic attacks-like feeling, and  these may be apnea spells.  The husband states that she still continues to  snore.        Past Medical  History:     Past Medical History   Diagnosis Date   . Other plastic surgery for unacceptable cosmetic appearance    . Asthma without status asthmaticus    . Headache(784.0)      Sinus   . Gastroesophageal reflux disease    . Irritable bowel syndrome    . Low back pain    . Arthritis      Neck Right Hand   . Claustrophobia    . Depression    . Seasonal allergies    . Sciatica of left side    . Smokes with greater than 40 pack year history      Quit 2 weks ago.   . Sleep apnea        Past Surgical History:     Past Surgical History   Procedure Laterality Date   . Egd, colonoscopy  2015   . Abdominoplasty, liposuction (cosmetic)  2009   . Liposuction, neck (cosmetic)  2009   . Hysteroscopy, endometrial ablation, thermachoice  2015   . Cystoscopy, ureteroscopy  2015   . Facelift, (rhytidectomy) (cosmetic)  02/06/2014     Procedure: FACELIFT, (RHYTIDECTOMY) (COSMETIC);  Surgeon: Katheren Puller,  MD;  Location: [facility] OR;  Service: Plastics;  Laterality: Bilateral;  RHYTIDECTOMY (COSMETIC)        Family History:     Family History   Problem Relation Age of Onset   . Anesthesia problems Sister    . Heart disease Mother    . Heart disease Father    . Cancer Maternal Grandfather        Social History:     Social History     Social History   . Marital Status: Married     Spouse Name: N/A   . Number of Children: N/A   . Years of Education: N/A     Social History Main Topics   . Smoking status: Current Some Day Smoker -- 0.00 packs/day for 40 years     Types: Cigarettes     Last Attempt to Quit: 12/17/2014   . Smokeless tobacco: Never Used   . Alcohol Use: No   . Drug Use: No   . Sexual Activity: Not Currently     Other Topics Concern   . Not on file     Social History Narrative       Allergies:   No Known Allergies    Medications:     Current Facility-Administered Medications   Medication Dose Route Frequency   . buPROPion XL  150 mg Oral Daily   . DULoxetine  60 mg Oral Daily   . influenza  0.5 mL Intramuscular  Once   . pantoprazole  40 mg Oral QAM AC       Review of Systems:   A comprehensive review of systems was: : negative except as meniotioned in HPI.    Physical Exam:     Filed Vitals:    01/03/16 0935   BP: 106/69   Pulse: 72   Temp: 97.7 F (36.5 C)   Resp: 16   SpO2: 96%       Intake and Output Summary (Last 24 hours) at Date Time    Intake/Output Summary (Last 24 hours) at 01/03/16 1042  Last data filed at 01/03/16 0611   Gross per 24 hour   Intake      0 ml   Output      0 ml   Net      0 ml       General: awake, alert, in NAD, talking  Neck: supple, no JVP elevation, no carotid bruit.  Heart: regular rate and rhythm, no gallop, rubs, clicks.  Lungs: clear to auscultation.  Abdomen, soft, normal bowel sounds.  Extremities: no edema, good distal pulses  CNS: Alert and oriented        Labs Reviewed:   Recent CBC   Recent Labs  Lab 01/02/16  1420   WBC 6.47   RBC 4.22   HGB 13.3   HEMATOCRIT 39.8   MCV 94.3   PLATELETS 248         Recent Labs  Lab 01/02/16  2233 01/02/16  1905 01/02/16  1420   SODIUM  --   --  139   POTASSIUM  --   --  4.3   CHLORIDE  --   --  107   CO2  --   --  23   GLUCOSE  --   --  100   BUN  --   --  16.0   CREATININE  --   --  0.8   AST (SGOT)  --   --  20  ALT  --   --  21   ALKALINE PHOSPHATASE  --   --  56   BILIRUBIN, TOTAL  --   --  0.3   TROPONIN I <0.01 0.01 <0.01       Rads:   Radiological Procedure reviewed.     ekg : SR, low votage, normal ecg 01/03/16  ekg 01/01/14 in [place]; [place] T in V1-3  Assessment:   55 year old with GERD/hiatal hernia, OSA-noncompliant who presents with rest CP X 45 minutes with normal ECG and CE X 3. Not An ACS or acute plaque rupture    Plan:   Suggest out patient stress nuclear with Dr.Anis  Asa 81 mg qd  Started on [employer]- ha sto lose about 20-25 lbs  Must be complaint with CPAP    Thank you for this consult.  We will continue to follow along with you.      Signed by: Esmeralda Links, MD

## 2016-01-03 NOTE — Discharge Summary (Incomplete)
The discharge instruction reviewed with pt. Understanding verbalized. Pt denied pain. VSS. All belongings sent home with pt.

## 2016-01-05 LAB — ECG 12-LEAD
Atrial Rate: 69 {beats}/min
P Axis: 49 degrees
P-R Interval: 156 ms
Q-T Interval: 412 ms
QRS Duration: 78 ms
QTC Calculation (Bezet): 441 ms
R Axis: 31 degrees
T Axis: 28 degrees
Ventricular Rate: 69 {beats}/min

## 2016-01-20 ENCOUNTER — Other Ambulatory Visit (INDEPENDENT_AMBULATORY_CARE_PROVIDER_SITE_OTHER): Payer: Self-pay | Admitting: Internal Medicine

## 2016-01-26 NOTE — Telephone Encounter (Signed)
Sent it

## 2016-03-03 ENCOUNTER — Emergency Department

## 2016-03-03 ENCOUNTER — Emergency Department
Admission: EM | Admit: 2016-03-03 | Discharge: 2016-03-03 | Disposition: A | Discharge status: Home / Self Care | Attending: Emergency Medicine | Admitting: Emergency Medicine

## 2016-03-03 DIAGNOSIS — J45909 Unspecified asthma, uncomplicated: Secondary | ICD-10-CM | POA: Insufficient documentation

## 2016-03-03 DIAGNOSIS — F430 Acute stress reaction: Secondary | ICD-10-CM

## 2016-03-03 DIAGNOSIS — K219 Gastro-esophageal reflux disease without esophagitis: Secondary | ICD-10-CM | POA: Insufficient documentation

## 2016-03-03 DIAGNOSIS — F439 Reaction to severe stress, unspecified: Secondary | ICD-10-CM | POA: Insufficient documentation

## 2016-03-03 DIAGNOSIS — Z87891 Personal history of nicotine dependence: Secondary | ICD-10-CM | POA: Insufficient documentation

## 2016-03-03 HISTORY — DX: Anxiety disorder, unspecified: F41.9

## 2016-03-03 MED ORDER — KETOROLAC TROMETHAMINE 30 MG/ML IJ SOLN
60.0000 mg | Freq: Once | INTRAMUSCULAR | Status: AC
Start: 2016-03-03 — End: 2016-03-03
  Administered 2016-03-03: 60 mg via INTRAMUSCULAR
  Filled 2016-03-03: qty 2

## 2016-03-03 NOTE — Discharge Instructions (Signed)
Anxiety, Panic     You have been diagnosed with an anxiety attack.     You seem to have had an anxiety attack. There are many conditions that can cause symptoms like these. If this is the first time this has happened, follow-up with your regular doctor. You may need more testing to be sure there isn’t another cause for your symptoms.     Anxiety causes very strong feelings of worry and fear. It may also cause chest pain or shortness of breath. You may feel like you have palpitations (a racing heart). You might feel numbness (like parts of your body are "asleep"), especially around the mouth and in the hands or feet.     Follow up with your counselor and family doctor. If you do not have an appointment in the next 2-3 days, call and make one. It is VERY IMPORTANT for your counselor and family doctor to know if you get worse.     YOU SHOULD SEEK MEDICAL ATTENTION IMMEDIATELY, EITHER HERE OR AT THE NEAREST EMERGENCY DEPARTMENT, IF ANY OF THE FOLLOWING OCCURS:  · You have symptoms that you normally don’t have with your anxiety attacks.  · You think of harming yourself (suicidal thoughts) or harming someone else.  · You have symptoms you normally don’t have and they last longer than normal or your medicine doesn't help. These include chest pain, passing out, feeling that your heart is racing or shortness of breath.  · You have a fever (temperature higher than 100.4ºF / 38ºC).
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
## 2016-03-03 NOTE — ED Notes (Signed)
MD at bedside. 
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
## 2016-04-11 ENCOUNTER — Other Ambulatory Visit (INDEPENDENT_AMBULATORY_CARE_PROVIDER_SITE_OTHER): Payer: Self-pay | Admitting: Internal Medicine

## 2016-04-11 NOTE — Telephone Encounter (Signed)
Sent it

## 2016-04-15 ENCOUNTER — Telehealth (INDEPENDENT_AMBULATORY_CARE_PROVIDER_SITE_OTHER): Payer: Self-pay | Admitting: Internal Medicine

## 2016-04-15 NOTE — Telephone Encounter (Signed)
Nexium caps 40mg   Qty 90  Take 1 capsule every morning before breakfast   [facility]

## 2016-04-18 ENCOUNTER — Telehealth (INDEPENDENT_AMBULATORY_CARE_PROVIDER_SITE_OTHER): Payer: Self-pay | Admitting: Internal Medicine

## 2016-04-18 NOTE — Telephone Encounter (Signed)
[facility] sent a request for a prior auth on NEXIUM 40 MG capsule    Please start prior auth process

## 2016-04-20 ENCOUNTER — Emergency Department

## 2016-04-20 ENCOUNTER — Emergency Department
Admission: EM | Admit: 2016-04-20 | Discharge: 2016-04-20 | Disposition: A | Discharge status: Home / Self Care | Attending: Emergency Medicine | Admitting: Emergency Medicine

## 2016-04-20 DIAGNOSIS — S80811A Abrasion, right lower leg, initial encounter: Secondary | ICD-10-CM | POA: Insufficient documentation

## 2016-04-20 DIAGNOSIS — S8261XA Displaced fracture of lateral malleolus of right fibula, initial encounter for closed fracture: Secondary | ICD-10-CM | POA: Insufficient documentation

## 2016-04-20 DIAGNOSIS — S82891A Other fracture of right lower leg, initial encounter for closed fracture: Secondary | ICD-10-CM

## 2016-04-20 DIAGNOSIS — S80812A Abrasion, left lower leg, initial encounter: Secondary | ICD-10-CM | POA: Insufficient documentation

## 2016-04-20 DIAGNOSIS — W010XXA Fall on same level from slipping, tripping and stumbling without subsequent striking against object, initial encounter: Secondary | ICD-10-CM | POA: Insufficient documentation

## 2016-04-20 DIAGNOSIS — T07XXXA Unspecified multiple injuries, initial encounter: Secondary | ICD-10-CM

## 2016-04-20 DIAGNOSIS — J45909 Unspecified asthma, uncomplicated: Secondary | ICD-10-CM | POA: Insufficient documentation

## 2016-04-20 DIAGNOSIS — F17210 Nicotine dependence, cigarettes, uncomplicated: Secondary | ICD-10-CM | POA: Insufficient documentation

## 2016-04-20 DIAGNOSIS — Z23 Encounter for immunization: Secondary | ICD-10-CM | POA: Insufficient documentation

## 2016-04-20 DIAGNOSIS — S93402A Sprain of unspecified ligament of left ankle, initial encounter: Secondary | ICD-10-CM

## 2016-04-20 DIAGNOSIS — Y9302 Activity, running: Secondary | ICD-10-CM | POA: Insufficient documentation

## 2016-04-20 DIAGNOSIS — K219 Gastro-esophageal reflux disease without esophagitis: Secondary | ICD-10-CM | POA: Insufficient documentation

## 2016-04-20 MED ORDER — HYDROCODONE-ACETAMINOPHEN 5-325 MG PO TABS
1.0000 | ORAL_TABLET | Freq: Four times a day (QID) | ORAL | Status: DC | PRN
Start: 2016-04-20 — End: 2016-09-11

## 2016-04-20 MED ORDER — TETANUS-DIPHTH-ACELL PERTUSSIS 5-2.5-18.5 LF-MCG/0.5 IM SUSP
0.5000 mL | Freq: Once | INTRAMUSCULAR | Status: AC
Start: 2016-04-20 — End: 2016-04-20
  Administered 2016-04-20: 0.5 mL via INTRAMUSCULAR
  Filled 2016-04-20: qty 0.5

## 2016-04-20 MED ORDER — IBUPROFEN 600 MG PO TABS
600.0000 mg | ORAL_TABLET | Freq: Four times a day (QID) | ORAL | Status: DC | PRN
Start: 2016-04-20 — End: 2017-02-12

## 2016-04-20 MED ORDER — HYDROCODONE-ACETAMINOPHEN 5-325 MG PO TABS
1.0000 | ORAL_TABLET | Freq: Once | ORAL | Status: AC
Start: 2016-04-20 — End: 2016-04-20
  Administered 2016-04-20: 1 via ORAL
  Filled 2016-04-20: qty 1

## 2016-04-20 MED ORDER — IBUPROFEN 600 MG PO TABS
600.0000 mg | ORAL_TABLET | Freq: Once | ORAL | Status: AC
Start: 2016-04-20 — End: 2016-04-20
  Administered 2016-04-20: 600 mg via ORAL
  Filled 2016-04-20: qty 1
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
## 2016-04-20 NOTE — Discharge Instructions (Signed)
Return to the ER immediately for any new or worsening problems or concerns    Ankle Fracture    You have a fracture of the ankle.    Three bones come together to make the ankle joint. They are the tibia, the fibula and the talus. The talus is the upper most bone of the foot.   You have a fibula fracture. The fibula is the smaller bone of the lower leg. It is on the same side of the ankle as the little toe. This injury often happens when the ankle is twisted strongly. This tears ankle ligaments. It also causes a break in the bone the ligaments hold together.    A fracture is a break in a bone. It means the same thing as saying a "broken bone." Usually, fractures heal in about 6-8 weeks. The place that is broken will eventually become stronger than the area around it. Fractures are often treated with a splint or cast. A splint keeps the injured area stable. However, the orthopedic (bone) doctor will probably change it to a cast. Most fractures can be managed with a splint or cast. Some need surgery for the best alignment and fracture correction. The orthopedic doctor will help decide this.    Some ankle fractures require immediate surgery. Your fracture is stable enough for you to go home. You will need a follow up with a referral doctor.    Do not put any weight on the injured ankle. Use crutches to help get around.    Generally, fracture treatment includes the use of pain medicine and a splint/cast to reduce movement. Treatment also involves Resting, Icing, Compressing and Elevating the injured area. Remember this as "RICE."   REST: Limit the use of the injured body part.   ICE: By applying ice to the affected area, swelling and pain can be reduced. Place some ice cubes in a re-sealable (Ziploc) bag and add some water. Put a thin washcloth between the bag and the skin. Apply the ice bag to the area for at least 20 minutes. Do this at least 4 times per day. It is okay to do this more often than directed.  You can also do it for longer than directed. NEVER APPLY ICE DIRECTLY TO THE SKIN.   COMPRESS: Compression means to apply pressure around the injured area such as with a splint, cast or an ACE bandage. Compression decreases swelling and improves comfort. Compression should be tight enough to relieve swelling but not so tight as to decrease circulation. Increasing pain, numbness, tingling, or change in skin color, are all signs of decreased circulation.   ELEVATE: Elevate the injured part. For example, a leg can be elevated by placing the leg on a chair while sitting or by propping it up on pillows while lying down.    You have a splint for your fracture. This is to reduce pain and keep the injured area immobilized (still). Use the splint until you follow up with an orthopedic (bone) doctor.    Use the following SPLINT CARE instructions. Do the following many times throughout the day:   Check capillary refill (circulation) in the nail beds. Press on the nail bed and then release. The nail bed should turn white. It should then go pink again in less than 2 seconds.   Watch to see if the area beyond the splint gets swollen.   Sometimes the splint is too tight. When this happens, the skin of the hand, foot, fingers or toes  is very cold, pale or numb to the touch. The wrap holding the splint in place can be loosened. You can come back here or go to the nearest Emergency Department to have it adjusted.    YOU SHOULD SEEK MEDICAL ATTENTION IMMEDIATELY, EITHER HERE OR AT THE NEAREST EMERGENCY DEPARTMENT, IF ANY OF THE FOLLOWING OCCURS:   You have a severe increase in pain or swelling in the injured area.   You have new numbness or tingling in or below the injured area.   Your foot gets cold and pale. This could mean that the foot has a problem with its blood supply.       Ankle Sprain    You have been diagnosed with an ankle sprain.    A sprain is a ligament injury, usually a tear or partial tear. Sprains can  hurt as much as broken bones. Sprains can be classified by the degree of injury. A first-degree sprain is considered a minor tear. A second-degree sprain is a partial tear of the ligament. A third-degree sprain often involves a small fracture, or break, of the bone that the ligament is attached to.    Sprains are usually treated with pain medication and a splint to keep the joint from moving. You should Rest, Ice, Compress, and Elevate the injured ankle. Remember this as "RICE."   REST: Limit the use of the injured body part.   ICE: By applying ice to the affected area, swelling and pain can be reduced. Place some ice cubes in a re-sealable (Ziploc) bag and add some water. Put a thin washcloth between the bag and the skin. Apply the ice bag to the area for at least 20 minutes. Do this at least 4 times per day. Using the ice for longer times and more frequently is OK. NEVER APPLY ICE DIRECTLY TO THE SKIN.   COMPRESS: Compression means to apply pressure around the injured area such as with a splint, cast or an ACE bandage. Compression decreases swelling and improves comfort. Compression should be tight enough to relieve swelling but not so tight as to decrease circulation. Increasing pain, numbness, tingling, or change in skin color, are all signs of decreased circulation.   ELEVATE: Elevate the injured part. For example, a sprained ankle can be placed up on a chair while sitting and propped up on pillows while lying down.    You have been given an ACE BANDAGE. The bandage will compress the ankle. This increases comfort and reduces swelling. The ACE bandage should fit snugly but not so tight as to decrease circulation (blood supply). Watch for swelling of the area outside the ACE wrap. Check capillary refill (circulation) in your toenails. To do this, press on the nail. It should turn white. When you let go, the nail should return to pink in less than 2 seconds. If it doesn't, the bandage is too tight.  Loosen the wrap if you need to.    Wear the ACE bandage:   For the next 1-2 weeks.    Ankle exercises are described below. Begin the exercises as soon as you are able. They will make the ankle stronger to prevent new injuries. Do the exercises 5 to 10 times each day.   Use your big toe to draw out the letters of the alphabet on the ground. Move your ankle as you make each letter.   Sit with your leg straight out in front of you. Wrap a towel around the ball of your foot (just  below your toes) and pull back. Pull hard enough to stretch the ankle. Don't pull hard enough to cause pain. Hold the stretch for 30 seconds.   Stand up. Rock onto the tiptoes of the injured foot and then return to the flat position. Repeat 10 times.   Rotate your ankle in a circle. Make 10 clockwise circles, then make 10 circles going the other way.    YOU SHOULD SEEK MEDICAL ATTENTION IMMEDIATELY, EITHER HERE OR AT THE NEAREST EMERGENCY DEPARTMENT, IF ANY OF THE FOLLOWING OCCURS:   Your pain gets much worse.   Your ankle or foot starts to tingle or it becomes numb.   Your foot is cold or pale. This might mean there is a problem with circulation (blood supply).       Abrasion    You have been diagnosed with an abrasion. This is a scrape of the outer skin layers.    Take off old dressings every day. Then put on a clean, dry dressing. If the dressing sticks to the wound, moisten it with water. This way, it can come off more easily.    Keep the wound clean and dry for the next 24 hours. You can wash the wound gently with soap and water. Then put on a dry bandage if needed, to protect it.    Put a thin layer of antibiotic ointment on the wound 2-3 times a day. This can be Polysporin / triple antibiotic. This can help prevent infection. It may help keep scarring to a minimum.    YOU SHOULD SEEK MEDICAL ATTENTION IMMEDIATELY, EITHER HERE OR AT THE NEAREST EMERGENCY DEPARTMENT, IF ANY OF THE FOLLOWING OCCURS:   Unusual redness or  swelling.   There are red streaks going up the arm or leg.   The wound smells bad or has a lot of drainage.   Fever (temperature higher than 100.48F / 38C), chills, more pain and / or swelling.

## 2016-04-20 NOTE — ED Provider Notes (Signed)
Physician/Midlevel provider first contact with patient: 04/20/16 1721         History     Chief Complaint   Patient presents with   . Ankle Pain     HPI Comments: 55 yo female with c/o b/l ankle injury just pta. Was jogging with sandbags on her back. Tripped off a curb. Twisted her right ankle then her left ankle. Also c/o left shin pain. C/o abrasions to her legs. Denies loc, neck or back pain. Tetanus not current.     Patient is a 55 y.o. female presenting with ankle pain. The history is provided by the patient.   Ankle Pain  Location:  Ankle  Injury: yes    Mechanism of injury: fall    Fall:     Impact surface:  Concrete  Pain details:     Quality:  Throbbing    Radiates to:  Does not radiate    Severity:  Moderate    Onset quality:  Sudden    Timing:  Constant  Chronicity:  New  Dislocation: no    Tetanus status:  Out of date  Prior injury to area:  No  Relieved by:  None tried  Worsened by:  Bearing weight  Ineffective treatments:  None tried  Associated symptoms: no back pain, no fever, no numbness and no tingling         Nursing (triage) note reviewed for the following pertinent information:  bilateral ankle injury whilst running with trainer approx 30 minutes ago    Past Medical History   Diagnosis Date   . Other plastic surgery for unacceptable cosmetic appearance    . Asthma without status asthmaticus    . Headache(784.0)      Sinus   . Gastroesophageal reflux disease    . Irritable bowel syndrome    . Low back pain    . Arthritis      Neck Right Hand   . Claustrophobia    . Depression    . Seasonal allergies    . Sciatica of left side    . Smokes with greater than 40 pack year history      Quit 2 weks ago.   . Sleep apnea    . Anxiety        Past Surgical History   Procedure Laterality Date   . Egd, colonoscopy  2015   . Abdominoplasty, liposuction (cosmetic)  2009   . Liposuction, neck (cosmetic)  2009   . Hysteroscopy, endometrial ablation, thermachoice  2015   . Cystoscopy, ureteroscopy  2015   .  Facelift, (rhytidectomy) (cosmetic)  02/06/2014     Procedure: FACELIFT, (RHYTIDECTOMY) (COSMETIC);  Surgeon: Katheren Puller, MD;  Location: [facility] OR;  Service: Plastics;  Laterality: Bilateral;  RHYTIDECTOMY (COSMETIC)        Family History   Problem Relation Age of Onset   . Anesthesia problems Sister    . Heart disease Mother    . Heart disease Father    . Cancer Maternal Grandfather        Social  Social History   Substance Use Topics   . Smoking status: Current Some Day Smoker -- 0.00 packs/day for 40 years     Types: Cigarettes     Last Attempt to Quit: 12/17/2014   . Smokeless tobacco: Never Used   . Alcohol Use: No       .     No Known Allergies    Home Medications  alum & mag hydroxide-simethicone (MAALOX PLUS) 200-200-20 mg/5 mL suspension     Take 30 mLs by mouth every 6 (six) hours as needed for Indigestion.     buPROPion XL (WELLBUTRIN XL) 150 MG 24 hr tablet     TAKE ONE TABLET BY MOUTH EVERY DAY     clonazePAM (KLONOPIN) 0.5 MG tablet     TAKE ONE TABLET BY MOUTH EVERY DAY     COMBIPATCH 0.05-0.14 MG/DAY     APPLY 1 PATCH ONTO THE SKIN TWICE WEEKLY     DULoxetine (CYMBALTA) 30 MG capsule     Take 30 mg by mouth daily.     fexofenadine (ALLEGRA) 180 MG tablet     Take 180 mg by mouth daily.     fluticasone (FLONASE) 50 MCG/ACT nasal spray     1 spray by Nasal route daily.     montelukast (SINGULAIR) 10 MG tablet     Take 10 mg by mouth nightly.     NEXIUM 40 MG capsule     TAKE 1 CAPSULE EVERY MORNING BEFORE BREAKFAST     Probiotic Product (PROBIOTIC DAILY PO)     Take 2 tablets by mouth daily.           Flagged for Removal             cyclobenzaprine (FLEXERIL) 5 MG tablet     Take 1 tablet (5 mg total) by mouth nightly as needed for Muscle spasms.     DULoxetine (CYMBALTA) 60 MG capsule     Take 60 mg by mouth daily.              Review of Systems   Constitutional: Negative for fever and chills.   Eyes: Negative for discharge and redness.   Respiratory: Negative for cough  and shortness of breath.    Cardiovascular: Negative for chest pain.   Gastrointestinal: Negative for nausea and vomiting.   Genitourinary: Negative for flank pain.   Musculoskeletal: Negative for back pain.        B/l ankle pain    Left shin pain     Skin: Positive for wound. Negative for color change.   Neurological: Negative for dizziness, syncope, weakness, light-headedness and numbness.       Physical Exam    BP: 106/79 mmHg, Heart Rate: 79, Temp: 99.3 F (37.4 C), Resp Rate: 18, SpO2: 99 %, Weight: 81.647 kg    Physical Exam   Constitutional: She is oriented to person, place, and time. She appears well-developed and well-nourished.   HENT:   Head: Normocephalic and atraumatic.   Eyes: Conjunctivae are normal. Right eye exhibits no discharge. Left eye exhibits no discharge.   Neck: Normal range of motion. Neck supple.   Cardiovascular: Normal rate and intact distal pulses.    Pulmonary/Chest: Effort normal. No respiratory distress.   Musculoskeletal: Normal range of motion. She exhibits tenderness.   B/l ankles ttp over lateral malleoi, no obvious deformities, rom decreased d/t pain, distal n/v intact    Left mid tib/fib ttp over anterior aspect, superficial abrasion noted    Right knee abrasion   Neurological: She is alert and oriented to person, place, and time.   Skin: Skin is warm and dry.   Psychiatric: She has a normal mood and affect. Her behavior is normal.   Nursing note and vitals reviewed.        MDM and ED Course     ED Medication Orders     Start  Ordered     Status Ordering Provider    04/20/16 1752 04/20/16 1751  tetanus-diphth-acell pertussis (BOOSTRIX) injection 0.5 mL   Once     Route: Intramuscular  Ordered Dose: 0.5 mL     Last MAR action:  Given Jaxston Chohan JEAN    04/20/16 1752 04/20/16 1751  ibuprofen (ADVIL,MOTRIN) tablet 600 mg   Once     Route: Oral  Ordered Dose: 600 mg     Last MAR action:  Given Calia Napp JEAN    04/20/16 1752 04/20/16 1751  HYDROcodone-acetaminophen (NORCO) 5-325  MG per tablet 1 tablet   Once     Route: Oral  Ordered Dose: 1 tablet     Last MAR action:  Given Kordell Jafri JEAN             MDM  Number of Diagnoses or Management Options  Abrasions of multiple sites:   Ankle fracture, right, closed, initial encounter:   Sprain of left ankle, unspecified ligament, initial encounter:   Diagnosis management comments: I, Polo Riley PA-C, have been the primary provider for Doralee Albino Kunin during this Emergency Dept visit.  Oxygen saturation by pulse oximetry is 95%-100%, Normal.  Interventions: None Needed    Imaging results d/w pt. Splint and walker provided. Recommended immediate er return for any new or worsening problems or concerns. Pt expressed understanding and agreement with [place] plan. Well appearing upon [place].     The attending signature signifies review and agreement of the history, physical examination, evaluation, clinical impression and plan except as noted  Case d/w Dr.McDavit who is in agreement with plan.        Amount and/or Complexity of Data Reviewed  Tests in the radiology section of CPT: reviewed and ordered              Procedures    Clinical Impression & Disposition     Clinical Impression  Final diagnoses:   Ankle fracture, right, closed, initial encounter   Sprain of left ankle, unspecified ligament, initial encounter   Abrasions of multiple sites        ED Disposition     Discharge Isabellah L Cerda discharge to home/self care.    Condition at disposition: Stable             New Prescriptions    HYDROCODONE-ACETAMINOPHEN (NORCO) 5-325 MG PER TABLET    Take 1 tablet by mouth every 6 (six) hours as needed.    IBUPROFEN (ADVIL,MOTRIN) 600 MG TABLET    Take 1 tablet (600 mg total) by mouth every 6 (six) hours as needed.                   Rondell Reams, [place]  04/20/16 1903    Pura Spice, MD  04/25/16 [phone]

## 2016-04-21 ENCOUNTER — Telehealth (INDEPENDENT_AMBULATORY_CARE_PROVIDER_SITE_OTHER): Payer: Self-pay

## 2016-04-21 NOTE — Telephone Encounter (Signed)
Called patient in regards to being seen at the ED for ANKLE FRACTURE, RIGHT, CLOSED, INITIAL ENCOUNTER, SPRAIN OF LEFT ANKLE, UNSPECIFIED LIGAMENT, INITIAL ENCOUNTER, and ABRASIONS OF MULTIPLE SITES. No answer.  Left VM for patient to return call.  Awaiting call back.

## 2016-04-25 ENCOUNTER — Telehealth (INDEPENDENT_AMBULATORY_CARE_PROVIDER_SITE_OTHER): Payer: Self-pay | Admitting: Internal Medicine

## 2016-04-25 ENCOUNTER — Other Ambulatory Visit (INDEPENDENT_AMBULATORY_CARE_PROVIDER_SITE_OTHER): Payer: Self-pay | Admitting: Internal Medicine

## 2016-04-25 DIAGNOSIS — S82899S Other fracture of unspecified lower leg, sequela: Secondary | ICD-10-CM

## 2016-04-25 NOTE — Telephone Encounter (Signed)
Patient left a message that she needs a referral for medical equipment submitted to [employer] for wheel chair and other supplies because she broke her leg. [employer] told her she needs a referral and the script she got won't work.    [phone]

## 2016-04-28 ENCOUNTER — Encounter (INDEPENDENT_AMBULATORY_CARE_PROVIDER_SITE_OTHER): Payer: Self-pay | Admitting: Internal Medicine

## 2016-04-28 DIAGNOSIS — S82899S Other fracture of unspecified lower leg, sequela: Secondary | ICD-10-CM

## 2016-04-28 NOTE — Progress Notes (Signed)
Patient requires DME for a shower chair/stool and wheelchair due to bilateral ankle injury fracture of ankle

## 2016-04-28 NOTE — Telephone Encounter (Signed)
Letter written for patient by Dr. Algis Downs

## 2016-05-05 ENCOUNTER — Telehealth (INDEPENDENT_AMBULATORY_CARE_PROVIDER_SITE_OTHER): Payer: Self-pay | Admitting: Internal Medicine

## 2016-05-05 NOTE — Telephone Encounter (Signed)
Prior authorization started on [email or web address] today for nexium 40 mg capsule orally once a day.  Waiting for a response back.

## 2016-05-12 ENCOUNTER — Ambulatory Visit (INDEPENDENT_AMBULATORY_CARE_PROVIDER_SITE_OTHER): Payer: PRIVATE HEALTH INSURANCE | Admitting: Family Medicine

## 2016-05-12 NOTE — Telephone Encounter (Signed)
PA initiated through CoverMyMeds on 05/05/16.

## 2016-05-30 ENCOUNTER — Other Ambulatory Visit (INDEPENDENT_AMBULATORY_CARE_PROVIDER_SITE_OTHER): Payer: Self-pay | Admitting: Internal Medicine

## 2016-05-31 NOTE — Telephone Encounter (Signed)
Sent it

## 2016-06-06 ENCOUNTER — Telehealth (INDEPENDENT_AMBULATORY_CARE_PROVIDER_SITE_OTHER): Payer: Self-pay | Admitting: Internal Medicine

## 2016-06-06 NOTE — Telephone Encounter (Signed)
Nexium 40 mg Approved today Case ID #: [id number]. Product Name:ST: Nexium (esomeprazole magnesium) - 75E - *DODA*;Status:Approved;Coverage Start Date:05/07/2016- Coverage End Date:10/09/2098.

## 2016-06-09 ENCOUNTER — Encounter (INDEPENDENT_AMBULATORY_CARE_PROVIDER_SITE_OTHER): Payer: Self-pay | Admitting: Internal Medicine

## 2016-08-15 ENCOUNTER — Encounter (INDEPENDENT_AMBULATORY_CARE_PROVIDER_SITE_OTHER): Payer: Self-pay | Admitting: Cardiovascular Disease

## 2016-09-11 ENCOUNTER — Emergency Department
Admission: EM | Admit: 2016-09-11 | Discharge: 2016-09-11 | Disposition: A | Discharge status: Home / Self Care | Attending: Emergency Medicine | Admitting: Emergency Medicine

## 2016-09-11 ENCOUNTER — Emergency Department

## 2016-09-11 DIAGNOSIS — F17210 Nicotine dependence, cigarettes, uncomplicated: Secondary | ICD-10-CM | POA: Insufficient documentation

## 2016-09-11 DIAGNOSIS — R197 Diarrhea, unspecified: Secondary | ICD-10-CM | POA: Insufficient documentation

## 2016-09-11 DIAGNOSIS — R1033 Periumbilical pain: Secondary | ICD-10-CM | POA: Insufficient documentation

## 2016-09-11 LAB — COMPREHENSIVE METABOLIC PANEL
ALT: 13 U/L (ref 0–55)
AST (SGOT): 17 U/L (ref 5–34)
Albumin/Globulin Ratio: 1.2 (ref 0.9–2.2)
Albumin: 4.1 g/dL (ref 3.5–5.0)
Alkaline Phosphatase: 80 U/L (ref 37–106)
Anion Gap: 9 (ref 5.0–15.0)
BUN: 13 mg/dL (ref 7.0–19.0)
Bilirubin, Total: 0.4 mg/dL (ref 0.2–1.2)
CO2: 24 mEq/L (ref 22–29)
Calcium: 9.3 mg/dL (ref 8.5–10.5)
Chloride: 106 mEq/L (ref 100–111)
Creatinine: 0.9 mg/dL (ref 0.6–1.0)
Globulin: 3.3 g/dL (ref 2.0–3.6)
Glucose: 88 mg/dL (ref 70–100)
Potassium: 3.7 mEq/L (ref 3.5–5.1)
Protein, Total: 7.4 g/dL (ref 6.0–8.3)
Sodium: 139 mEq/L (ref 136–145)

## 2016-09-11 LAB — CBC AND DIFFERENTIAL
Absolute NRBC: 0 10*3/uL
Basophils Absolute Automated: 0.04 10*3/uL (ref 0.00–0.20)
Basophils Automated: 0.6 %
Eosinophils Absolute Automated: 0.18 10*3/uL (ref 0.00–0.70)
Eosinophils Automated: 2.5 %
Hematocrit: 41.1 % (ref 37.0–47.0)
Hgb: 14.4 g/dL (ref 12.0–16.0)
Immature Granulocytes Absolute: 0.01 10*3/uL
Immature Granulocytes: 0.1 %
Lymphocytes Absolute Automated: 2.13 10*3/uL (ref 0.50–4.40)
Lymphocytes Automated: 30.1 %
MCH: 32.2 pg — ABNORMAL HIGH (ref 28.0–32.0)
MCHC: 35 g/dL (ref 32.0–36.0)
MCV: 91.9 fL (ref 80.0–100.0)
MPV: 9.9 fL (ref 9.4–12.3)
Monocytes Absolute Automated: 0.54 10*3/uL (ref 0.00–1.20)
Monocytes: 7.6 %
Neutrophils Absolute: 4.17 10*3/uL (ref 1.80–8.10)
Neutrophils: 59.1 %
Nucleated RBC: 0 /100 WBC (ref 0.0–1.0)
Platelets: 244 10*3/uL (ref 140–400)
RBC: 4.47 10*6/uL (ref 4.20–5.40)
RDW: 13 % (ref 12–15)
WBC: 7.07 10*3/uL (ref 3.50–10.80)

## 2016-09-11 LAB — URINALYSIS
Bilirubin, UA: NEGATIVE
Glucose, UA: NEGATIVE
Ketones UA: NEGATIVE
Leukocyte Esterase, UA: NEGATIVE
Nitrite, UA: NEGATIVE
Protein, UR: NEGATIVE
Specific Gravity UA: 1.005 (ref 1.001–1.035)
Urine pH: 6 (ref 5.0–8.0)
Urobilinogen, UA: 0.2 mg/dL

## 2016-09-11 LAB — GFR: EGFR: >60

## 2016-09-11 LAB — URINE MICROSCOPIC: RBC, UA: 0 /hpf (ref 0–5)

## 2016-09-11 LAB — LIPASE: Lipase: 17 U/L (ref 8–78)

## 2016-09-11 MED ORDER — IOHEXOL 350 MG/ML IV SOLN
100.0000 mL | Freq: Once | INTRAVENOUS | Status: AC | PRN
Start: 2016-09-11 — End: 2016-09-11
  Administered 2016-09-11: 100 mL via INTRAVENOUS

## 2016-09-11 MED ORDER — ONDANSETRON HCL 4 MG/2ML IJ SOLN
4.0000 mg | Freq: Once | INTRAMUSCULAR | Status: AC
Start: 2016-09-11 — End: 2016-09-11
  Administered 2016-09-11: 4 mg via INTRAVENOUS
  Filled 2016-09-11: qty 2

## 2016-09-11 NOTE — ED Provider Notes (Signed)
Physician/Midlevel provider first contact with patient: 09/11/16 1622         History     Chief Complaint   Patient presents with   . Abdominal Pain   . Diarrhea     55 year old female here with periumbilical gall pain, nausea and diarrhea.  For a few weeks now patient has had intermittent periumbilical abdominal pain with associated diarrhea.  Tonight patient was going to dinner when she started having severe periumbilical pain associated with nausea and diarrhea.  Patient denies any vomiting, fever, chest pain, SOB or urinary symptoms.  She denies any recent travel or antibiotic use.      The history is provided by the patient. No language interpreter was used.   Diarrhea   Associated symptoms: abdominal pain    Associated symptoms: no vomiting             Past Medical History:   Diagnosis Date   . Anxiety    . Arthritis     Neck Right Hand   . Asthma without status asthmaticus    . Claustrophobia    . Depression    . Gastroesophageal reflux disease    . Headache(784.0)     Sinus   . Irritable bowel syndrome    . Low back pain    . Other plastic surgery for unacceptable cosmetic appearance    . Sciatica of left side    . Seasonal allergies    . Sleep apnea    . Smokes with greater than 40 pack year history     Quit 2 weks ago.       Past Surgical History:   Procedure Laterality Date   . ABDOMINOPLASTY, LIPOSUCTION (COSMETIC)  2009   . CYSTOSCOPY, URETEROSCOPY  2015   . EGD, COLONOSCOPY  2015   . FACELIFT, (RHYTIDECTOMY) (COSMETIC)  02/06/2014    Procedure: FACELIFT, (RHYTIDECTOMY) (COSMETIC);  Surgeon: Katheren Puller, MD;  Location: [facility] OR;  Service: Plastics;  Laterality: Bilateral;  RHYTIDECTOMY (COSMETIC)    . HYSTEROSCOPY, ENDOMETRIAL ABLATION, THERMACHOICE  2015   . LIPOSUCTION, NECK (COSMETIC)  2009       Family History   Problem Relation Age of Onset   . Heart disease Mother    . Heart disease Father    . Cancer Maternal Grandfather    . Anesthesia problems Sister        Social  Social  History   Substance Use Topics   . Smoking status: Current Every Day Smoker     Packs/day: 0.00     Years: 40.00     Types: Cigarettes   . Smokeless tobacco: Never Used   . Alcohol use No       .     No Known Allergies    Home Medications             buPROPion XL (WELLBUTRIN XL) 150 MG 24 hr tablet     TAKE ONE TABLET BY MOUTH EVERY DAY     clonazePAM (KLONOPIN) 0.5 MG tablet     TAKE ONE TABLET BY MOUTH EVERY DAY     COMBIPATCH 0.05-0.14 MG/DAY     APPLY 1 PATCH ONTO THE SKIN TWICE WEEKLY     DULoxetine (CYMBALTA) 30 MG capsule     Take 60 mg by mouth daily.         fexofenadine (ALLEGRA) 180 MG tablet     Take 180 mg by mouth daily.     fluticasone (FLONASE) 50  MCG/ACT nasal spray     1 spray by Nasal route daily.     ibuprofen (ADVIL,MOTRIN) 600 MG tablet     Take 1 tablet (600 mg total) by mouth every 6 (six) hours as needed.     Probiotic Product (PROBIOTIC DAILY PO)     Take 2 tablets by mouth daily.           Review of Systems   Constitutional: Negative.    HENT: Negative.    Respiratory: Negative.    Cardiovascular: Negative.    Gastrointestinal: Positive for abdominal pain, diarrhea and nausea. Negative for vomiting.   Genitourinary: Negative.    Musculoskeletal: Negative.    Skin: Negative.    Neurological: Negative.    Hematological: Negative.    Psychiatric/Behavioral: Negative.    All other systems reviewed and are negative.      Physical Exam    BP: 120/90, Heart Rate: 81, Temp: 98.4 F (36.9 C), Resp Rate: 18, SpO2: 99 %, Weight: 80.3 kg    Physical Exam   Constitutional: She is oriented to person, place, and time. She appears well-developed and well-nourished.   HENT:   Head: Normocephalic and atraumatic.   Neck: Normal range of motion. Neck supple.   Cardiovascular: Normal rate, regular rhythm, normal heart sounds and intact distal pulses.    Pulmonary/Chest: Effort normal and breath sounds normal.   Abdominal: Soft. Normal appearance and bowel sounds are normal. There is tenderness in the  periumbilical area.   Musculoskeletal: Normal range of motion.   Neurological: She is alert and oriented to person, place, and time.   Skin: Skin is warm and dry. Capillary refill takes less than 2 seconds.   Psychiatric: She has a normal mood and affect. Her behavior is normal. Judgment and thought content normal.   Nursing note and vitals reviewed.        MDM and ED Course     ED Medication Orders     Start Ordered     Status Ordering Provider    09/11/16 1751 09/11/16 1751  iohexol (OMNIPAQUE) 350 MG/ML injection 100 mL  IMG once as needed     Route: Intravenous  Ordered Dose: 100 mL     Last MAR action:  Imaging Agent Given WALLO, ELISE A    09/11/16 1704 09/11/16 1703  ondansetron (ZOFRAN) injection 4 mg  Once     Route: Intravenous  Ordered Dose: 4 mg     Last MAR action:  Given Nitesh Pitstick HOANG             MDM  Number of Diagnoses or Management Options  Diarrhea, unspecified type:   Periumbilical abdominal pain:   Diagnosis management comments: *This note was generated by the Epic EMR system/ Dragon speech recognition and may contain inherent errors or omissions not intended by the user. Grammatical errors, random word insertions, deletions, pronoun errors and incomplete sentences are occasional consequences of this technology due to software limitations. Not all errors are caught or corrected. If there are questions or concerns about the content of this note or information contained within the body of this dictation they should be addressed directly with the author for clarification    The attending signature signifies review and agreement of the history , PE, evaluation, clinical impression and discharge plan except as otherwise noted.    I, Elnita Maxwell, have been the primary provider for Chivonne Rascon Shell during this Emergency Dept visit.    I reviewed nursing recorded  vitals and history including PMSFHX    Oxygen saturation by pulse oximetry is 95%-100%, Normal.  Interventions: None  Needed.    DDX to include, but not limited to:  Colitis, diverticulitis, c-diff, appendicitis, AKI  Plan:  The patient presents with abdominal pain without signs of peritonitis or other life-threatening or serious etiology. The patient appears stable for discharge and has been instructed to return immediately if the symptoms worsen or change in any way, or in 8-12 hours if not improved for re-evaluation. Abdominal pain warnings were given and I emphasized the need for close follow-up.  Pt refer back to her GI doctor for follow up.  Pt given script and stool collecting kit for outpatient stool culture.         Amount and/or Complexity of Data Reviewed  Clinical lab tests: ordered and reviewed  Tests in the radiology section of CPT: ordered and reviewed  Tests in the medicine section of CPT: reviewed and ordered  Review and summarize past medical records: yes    Risk of Complications, Morbidity, and/or Mortality  Presenting problems: high  Diagnostic procedures: high  Management options: high    Patient Progress  Patient progress: stable        ED Course              Procedures    Results     Procedure Component Value Units Date/Time    Lipase [[id number]] Collected:  09/11/16 1701    Specimen:  Blood Updated:  09/11/16 1729     Lipase 17 U/L     GFR [[id number]] Collected:  09/11/16 1701     Updated:  09/11/16 1729     EGFR >60.0    Comprehensive metabolic panel [[id number]] Collected:  09/11/16 1701    Specimen:  Blood Updated:  09/11/16 1729     Glucose 88 mg/dL      BUN 96.2 mg/dL      Creatinine 0.9 mg/dL      Sodium 952 mEq/L      Potassium 3.7 mEq/L      Chloride 106 mEq/L      CO2 24 mEq/L      Calcium 9.3 mg/dL      Protein, Total 7.4 g/dL      Albumin 4.1 g/dL      AST (SGOT) 17 U/L      ALT 13 U/L      Alkaline Phosphatase 80 U/L      Bilirubin, Total 0.4 mg/dL      Globulin 3.3 g/dL      Albumin/Globulin Ratio 1.2     Anion Gap 9.0    CBC with differential [[id number]]  (Abnormal) Collected:  09/11/16 1701     Specimen:  Blood from Blood Updated:  09/11/16 1711     WBC 7.07 x10 3/uL      Hgb 14.4 g/dL      Hematocrit 02.7 %      Platelets 244 x10 3/uL      RBC 4.47 x10 6/uL      MCV 91.9 fL      MCH 32.2 (H) pg      MCHC 35.0 g/dL      RDW 13 %      MPV 9.9 fL      Neutrophils 59.1 %      Lymphocytes Automated 30.1 %      Monocytes 7.6 %      Eosinophils Automated 2.5 %      Basophils Automated 0.6 %  Immature Granulocyte 0.1 %      Nucleated RBC 0.0 /100 WBC      Neutrophils Absolute 4.17 x10 3/uL      Abs Lymph Automated 2.13 x10 3/uL      Abs Mono Automated 0.54 x10 3/uL      Abs Eos Automated 0.18 x10 3/uL      Absolute Baso Automated 0.04 x10 3/uL      Absolute Immature Granulocyte 0.01 x10 3/uL      Absolute NRBC 0.00 x10 3/uL     UA with reflex to micro (pts  3 + yrs) [[id number]]  (Abnormal) Collected:  09/11/16 1651    Specimen:  Urine from Urine, Clean Catch Updated:  09/11/16 1708     Urine Type Clean Catch     Color, UA YELLOW     Clarity, UA CLEAR     Specific Gravity UA 1.005     Urine pH 6.0     Leukocyte Esterase, UA NEGATIVE     Nitrite, UA NEGATIVE     Protein, UR NEGATIVE     Glucose, UA NEGATIVE     Ketones UA NEGATIVE     Urobilinogen, UA 0.2 mg/dL      Bilirubin, UA NEGATIVE     Blood, UA SMALL (A)    Microscopic, Urine [[id number]] Collected:  09/11/16 1651     Updated:  09/11/16 1708     RBC, UA 0 -2 /hpf      WBC, UA 0 - 5 /hpf      Squamous Epithelial Cells, Urine 0 - 5 /hpf           Radiology Results (24 Hour)     Procedure Component Value Units Date/Time    CT Abd/Pelvis with IV Contrast only [[id number]] Collected:  09/11/16 1816    Order Status:  Completed Updated:  09/11/16 1827    Narrative:       EXAM: CT OF THE ABDOMEN AND PELVIS    TECHNIQUE: 5-mm axial CT images of the abdomen and pelvis were obtained   following bolus administration of nonionic intravenous contrast. Oral  contrast was also administered. The following dose reduction techniques  were utilized: Automated exposure  control and/or adjustment of the mA  and /or kV according to the patient's size and the use of the iterative  reconstruction technique.      HISTORY:  Several weeks of abdominal pain with diarrhea and nausea    FINDINGS: The large and small bowel demonstrate normal morphology. No  evidence of obstruction or ileus. Scattered diverticula noted in the  distal descending colon in sigmoid colon without evidence of  diverticulitis. Dense debris within the cecum as well as the distal  stomach may represent ingested pills. No free air or free  intraperitoneal fluid.    The liver, spleen kidneys, adrenal glands and pancreas are unremarkable.  No pelvic or abdominal adenopathy. Uterus and adnexa are also grossly  unremarkable. The bladder is partially decompressed.    Lung bases are clear.    Bone window images demonstrate no focal abnormalities.      Impression:         1. No imaging correlate for patient's presenting abdominal pain symptoms    Sable Feil, MD   09/11/2016 6:22 PM            I have reviewed all labs and/or radiological studies. I have reviewed all xrays if any myself on the PACS system.    Clinical Impression &  Disposition     Clinical Impression  Final diagnoses:   Periumbilical abdominal pain   Diarrhea, unspecified type        ED Disposition     ED Disposition Condition Date/Time Comment    Discharge  Sun Sep 11, 2016  6:40 PM Anikah Hogge Sjogren discharge to home/self care.    Condition at disposition: Stable           Discharge Medication List as of 09/11/2016  6:41 PM                    Carney Harder [place], [place]  09/11/16 1946       Kalman Drape, MD  09/12/16 2043

## 2016-09-11 NOTE — Discharge Instructions (Signed)
Abdominal Pain     You have been diagnosed with abdominal (belly) pain. The cause of your pain is not yet known.     Many things can cause abdominal pain. Examples include viral infections and bowel (intestine) spasms. You might need another examination or more tests to find out why you have pain.     At this time, your pain does not seem to be caused by anything dangerous. You do not need surgery. You do not need to stay in the hospital.      Though we don’t believe your condition is dangerous right now, it is important to be careful. Sometimes a problem that seems mild can become serious later. This is why it is very important that you return here or go to the nearest Emergency Department unless you are 100% improved.     Return here or go to the nearest Emergency Department, or follow up with your physician in:  · 12 hours.     Drink only clear liquids such as water, clear broth, sports drinks, or clear caffeine-free soft drinks, like 7-Up or Sprite, for the next:  · 12 hours.     YOU SHOULD SEEK MEDICAL ATTENTION IMMEDIATELY, EITHER HERE OR AT THE NEAREST EMERGENCY DEPARTMENT, IF ANY OF THE FOLLOWING OCCURS:  · Your pain does not go away or gets worse.  · You cannot keep fluids down or your vomit is dark green.   · You vomit blood or see blood in your stool. Blood might be bright red or dark red. It can also be black and look like tar.  · You have a fever (temperature higher than 100.4ºF / 38ºC) or shaking chills.  · Your skin or eyes look yellow or your urine looks brown.  · You have severe diarrhea.         Diarrhea     You have been diagnosed with diarrhea.     You have diarrhea when you have stools (bowel movements) that are soft or liquid, or when you have too many stools in a day. You may also have stomach cramps/ pains, nausea (feeling sick to your stomach), vomiting and fever (temperature higher than 100.4ºF / 38ºC).     Diarrhea can be caused by bacteria, viruses and parasites. People sometimes get  “traveler s diarrhea.” They get it when they go to other countries. It is caused by bacteria.     People with diarrhea often lose a lot of body fluids. This causes dehydration. Drink a lot of water or other fluids to stay hydrated. You may also be given medicine for your diarrhea. It will slow the diarrhea and stop the vomiting (if you have this symptom).     You should drink lots of natural juices or a sports-type drink that has electrolytes (sodium, potassium, etc). Do not drink a lot of plain water. Sugary drinks like apple and pear juice might make the diarrhea worse.     You might be given medicine to help you with your diarrhea, nausea (feeling sick), vomiting and stomach cramps. This will depend on your age, medical history and symptoms.     It is OK for you to go home today.     Good hygiene (keeping clean) helps to keep the problem from spreading. Please wash your hands often, using soap and water, especially after using the bathroom. Do not prepare food or share food, drinks or utensils (forks, knives, etc.) with other people.     If we asked for a stool sample but you couldn t provide one, you may need to come back   with a sample. You will get special instructions about how to handle and return the sample.     It is very important that you follow up with your regular doctor or a specialist. The doctor will tell you how soon this follow-up needs to be.     YOU SHOULD SEEK MEDICAL ATTENTION IMMEDIATELY, EITHER HERE OR AT THE NEAREST EMERGENCY DEPARTMENT, IF ANY OF THE FOLLOWING OCCURS:  · You are vomiting and cannot keep down fluids.  · There is blood, pus or mucous in your stool.  · You have symptoms of dehydration. These include dry mouth, not urinating (peeing) at least once every eight hours, feeling dizzy/lightheaded, severe weakness or passing out.

## 2016-09-11 NOTE — ED Triage Notes (Signed)
Pt states she has mid abd pain for past few weeks worse over last week. States tonight she went out to eat and instantly had diarrhea and nausea.. Pt concerned she has C-diff or Gall Bladder problem.

## 2016-09-12 ENCOUNTER — Ambulatory Visit: Attending: Physician Assistant

## 2016-09-12 DIAGNOSIS — R197 Diarrhea, unspecified: Secondary | ICD-10-CM | POA: Insufficient documentation

## 2016-10-02 ENCOUNTER — Other Ambulatory Visit: Payer: Self-pay

## 2016-11-01 ENCOUNTER — Other Ambulatory Visit (INDEPENDENT_AMBULATORY_CARE_PROVIDER_SITE_OTHER): Payer: Self-pay | Admitting: Internal Medicine

## 2016-11-01 NOTE — Telephone Encounter (Signed)
Sent them

## 2017-02-12 ENCOUNTER — Emergency Department

## 2017-02-12 ENCOUNTER — Emergency Department
Admission: EM | Admit: 2017-02-12 | Discharge: 2017-02-12 | Disposition: A | Discharge status: Home / Self Care | Attending: Emergency Medicine | Admitting: Emergency Medicine

## 2017-02-12 DIAGNOSIS — K589 Irritable bowel syndrome without diarrhea: Secondary | ICD-10-CM | POA: Insufficient documentation

## 2017-02-12 DIAGNOSIS — J302 Other seasonal allergic rhinitis: Secondary | ICD-10-CM | POA: Insufficient documentation

## 2017-02-12 DIAGNOSIS — K219 Gastro-esophageal reflux disease without esophagitis: Secondary | ICD-10-CM | POA: Insufficient documentation

## 2017-02-12 DIAGNOSIS — M19041 Primary osteoarthritis, right hand: Secondary | ICD-10-CM | POA: Insufficient documentation

## 2017-02-12 DIAGNOSIS — F419 Anxiety disorder, unspecified: Secondary | ICD-10-CM | POA: Insufficient documentation

## 2017-02-12 DIAGNOSIS — F17210 Nicotine dependence, cigarettes, uncomplicated: Secondary | ICD-10-CM | POA: Insufficient documentation

## 2017-02-12 DIAGNOSIS — M47892 Other spondylosis, cervical region: Secondary | ICD-10-CM | POA: Insufficient documentation

## 2017-02-12 DIAGNOSIS — T505X5A Adverse effect of appetite depressants, initial encounter: Secondary | ICD-10-CM | POA: Insufficient documentation

## 2017-02-12 DIAGNOSIS — T50905A Adverse effect of unspecified drugs, medicaments and biological substances, initial encounter: Secondary | ICD-10-CM

## 2017-02-12 DIAGNOSIS — Z79890 Hormone replacement therapy (postmenopausal): Secondary | ICD-10-CM | POA: Insufficient documentation

## 2017-02-12 DIAGNOSIS — J45909 Unspecified asthma, uncomplicated: Secondary | ICD-10-CM | POA: Insufficient documentation

## 2017-02-12 DIAGNOSIS — Z79899 Other long term (current) drug therapy: Secondary | ICD-10-CM | POA: Insufficient documentation

## 2017-02-12 MED ORDER — DIAZEPAM 5 MG PO TABS
5.0000 mg | ORAL_TABLET | Freq: Once | ORAL | Status: AC
Start: 2017-02-12 — End: 2017-02-12
  Administered 2017-02-12: 5 mg via ORAL
  Filled 2017-02-12: qty 1

## 2017-02-12 NOTE — ED Provider Notes (Signed)
Physician/Midlevel provider first contact with patient: 02/12/17 1452         History     Chief Complaint   Patient presents with   . Anxiety     56 year old female presents stating that she started phentermine 2 days ago for weight control on antidepressants states that she feels very jittery, shaky and unwell .  She is going to yell.  She feels so bad on the inside.  Denies any desire to harm self as new. . ..       The history is provided by the patient. No language interpreter was used.   Anxiety   This is a new problem. The current episode started today. The problem occurs constantly. The problem has been unchanged. Pertinent negatives include no abdominal pain, anorexia, arthralgias, chills, congestion, coughing, fatigue, nausea or numbness. Nothing aggravates the symptoms. She has tried nothing for the symptoms. The treatment provided no relief.            Past Medical History:   Diagnosis Date   . Anxiety    . Arthritis     Neck Right Hand   . Asthma without status asthmaticus    . Claustrophobia    . Depression    . Gastroesophageal reflux disease    . Headache(784.0)     Sinus   . Irritable bowel syndrome    . Low back pain    . Other plastic surgery for unacceptable cosmetic appearance    . Sciatica of left side    . Seasonal allergies    . Sleep apnea    . Smokes with greater than 40 pack year history     Quit 2 weks ago.       Past Surgical History:   Procedure Laterality Date   . ABDOMINOPLASTY, LIPOSUCTION (COSMETIC)  2009   . CYSTOSCOPY, URETEROSCOPY  2015   . EGD, COLONOSCOPY  2015   . FACELIFT, (RHYTIDECTOMY) (COSMETIC)  02/06/2014    Procedure: FACELIFT, (RHYTIDECTOMY) (COSMETIC);  Surgeon: Katheren Puller, MD;  Location: [facility] OR;  Service: Plastics;  Laterality: Bilateral;  RHYTIDECTOMY (COSMETIC)    . HYSTEROSCOPY, ENDOMETRIAL ABLATION, THERMACHOICE  2015   . LIPOSUCTION, NECK (COSMETIC)  2009       Family History   Problem Relation Age of Onset   . Heart disease Mother    . Heart  disease Father    . Cancer Maternal Grandfather    . Anesthesia problems Sister        Social  Social History   Substance Use Topics   . Smoking status: Current Every Day Smoker     Packs/day: 0.00     Years: 40.00     Types: Cigarettes   . Smokeless tobacco: Never Used   . Alcohol use No       .     No Known Allergies    Home Medications             buPROPion XL (WELLBUTRIN XL) 150 MG 24 hr tablet     TAKE ONE TABLET BY MOUTH EVERY DAY     clonazePAM (KLONOPIN) 0.5 MG tablet     TAKE ONE TABLET BY MOUTH EVERY DAY     COMBIPATCH 0.05-0.14 MG/DAY     APPLY 1 PATCH TWICE WEEKLY     DULoxetine (CYMBALTA) 30 MG capsule     Take 90 mg by mouth daily.         fexofenadine (ALLEGRA) 180 MG tablet  Take 180 mg by mouth daily.     fluticasone (FLONASE) 50 MCG/ACT nasal spray     1 spray by Nasal route daily.     montelukast (SINGULAIR) 10 MG tablet     TAKE 1 TAB BY MOUTH AT BEDTIME     Probiotic Product (PROBIOTIC DAILY PO)     Take 2 tablets by mouth daily.           Review of Systems   Constitutional: Negative for chills and fatigue.   HENT: Negative for congestion.    Respiratory: Negative for cough.    Gastrointestinal: Negative for abdominal pain, anorexia and nausea.   Genitourinary: Negative for dysuria.   Musculoskeletal: Negative for arthralgias.   Skin: Negative for color change and wound.   Allergic/Immunologic: Negative for food allergies.   Neurological: Negative for dizziness, light-headedness and numbness.   Hematological: Negative for adenopathy. Does not bruise/bleed easily.   Psychiatric/Behavioral: Negative for agitation. The patient is not nervous/anxious and is not hyperactive.        Physical Exam    BP: 113/76, Heart Rate: 80, Temp: 99 F (37.2 C), Resp Rate: 18, SpO2: 100 %    Physical Exam   Constitutional: She is oriented to person, place, and time. She appears well-developed and well-nourished. No distress.   HENT:   Head: Normocephalic and atraumatic.   Eyes: Conjunctivae and EOM are normal.  Pupils are equal, round, and reactive to light.   Neck: Normal range of motion. Neck supple. Carotid bruit is not present.   Cardiovascular: Normal rate and regular rhythm.  Exam reveals no friction rub.    No murmur heard.  Pulmonary/Chest: Effort normal and breath sounds normal. No stridor. No respiratory distress. She has no wheezes. She exhibits no tenderness.   Abdominal: Soft. She exhibits no distension. There is no tenderness. There is no rebound and no guarding.   Musculoskeletal: She exhibits no edema or tenderness.   Neurological: She is alert and oriented to person, place, and time. No cranial nerve deficit. Coordination normal.   Mildly tremulous   Skin: Skin is warm, dry and intact. She is not diaphoretic. No erythema.   Psychiatric: She has a normal mood and affect. Her behavior is normal. Judgment normal.   Nursing note and vitals reviewed.        MDM and ED Course     ED Medication Orders     Start Ordered     Status Ordering Provider    02/12/17 1506 02/12/17 1505  diazePAM (VALIUM) tablet 5 mg  Once     Route: Oral  Ordered Dose: 5 mg     Last MAR action:  Given Jakari Jacot J             MDM  Number of Diagnoses or Management Options  Anxiety:   Medication side effect, initial encounter:   Diagnosis management comments: I, Roselee Culver, DO, (emergency medicine physician) have been the primary provider for this patient during this Emergency Dept visit.    DDX: Including but not limited to medication side effect, anxiety    Patient presents concerned about a medication side effect.  I do believe the medication is contributing and also underlying anxiety is making her symptoms worse       Amount and/or Complexity of Data Reviewed  Tests in the medicine section of CPT: ordered and reviewed  Procedures    Clinical Impression & Disposition     Clinical Impression  Final diagnoses:   Medication side effect, initial encounter   Anxiety        ED Disposition     ED Disposition  Condition Date/Time Comment    Discharge  Sun Feb 12, 2017  4:21 PM Airica Schwartzkopf Narine discharge to home/self care.    Condition at disposition: Stable           New Prescriptions    No medications on file                 Toni Arthurs, DO  02/12/17 1621

## 2017-02-12 NOTE — ED Triage Notes (Signed)
Pt states she started taking Phenteramine 6 days ago, she started taking 1/2 tab and is now taking full tab and is feeling anxious

## 2017-02-12 NOTE — Discharge Instructions (Signed)
Medication Reaction    You have been seen for an unusual reaction to a medicine.    You are probably having your symptoms because of a reaction to a medicine you used. Because the effects of the medicine are now minor or almost gone, it is OK for you to go home.    Bad or unexpected reactions to drugs happen often. Almost all medicines have POSSIBLE side effects. Most of these are minor, but sometimes the effects can be more severe. Sometimes, people take many medications at the same time. Some of the medications can react badly with one another.    Medicines are normally safe when you follow the instructions. However, it may be dangerous to take even one pill more than you are supposed to. This is why it is VERY IMPORTANT to follow the instructions on your medicine bottles.    Use the following general instructions:   ALWAYS take your medicines as prescribed.   You may forget to take medicine or miss a pill. If you do, ask your doctor or pharmacist before taking an extra pill to make up for the one you missed.   NEVER take someone else's medicine that was not prescribed to you.   If you have symptoms that may be caused by medication, tell your doctor before taking any more.    YOU SHOULD SEEK MEDICAL ATTENTION IMMEDIATELY, EITHER HERE OR AT THE NEAREST EMERGENCY DEPARTMENT, IF ANY OF THE FOLLOWING OCCURS:   You have signs of an allergic reaction: You have a rash, shortness of breath, or your lips or tongue become swollen.   You get lightheaded, dizzy or confused, or pass out after taking a medicine.   You think you might harm yourself or others or often think about suicide (killing yourself).   You are vomiting or have severe headaches.   You have any symptoms you think are severe or possibly dangerous.               Anxiety, Panic    You have been diagnosed with an anxiety attack.    You seem to have had an anxiety attack. There are many conditions that can cause symptoms like these. If this is  the first time this has happened, follow-up with your regular doctor. You may need more testing to be sure there isn't another cause for your symptoms.    Anxiety causes very strong feelings of worry and fear. It may also cause chest pain or shortness of breath. You may feel like you have palpitations (a racing heart). You might feel numbness (like parts of your body are "asleep"), especially around the mouth and in the hands or feet.    Follow up with your counselor and family doctor. If you do not have an appointment in the next 2-3 days, call and make one. It is VERY IMPORTANT for your counselor and family doctor to know if you get worse.    YOU SHOULD SEEK MEDICAL ATTENTION IMMEDIATELY, EITHER HERE OR AT THE NEAREST EMERGENCY DEPARTMENT, IF ANY OF THE FOLLOWING OCCURS:   You have symptoms that you normally don't have with your anxiety attacks.   You think of harming yourself (suicidal thoughts) or harming someone else.   You have symptoms you normally don't have and they last longer than normal or your medicine doesn't help. These include chest pain, passing out, feeling that your heart is racing or shortness of breath.   You have a fever (temperature higher than 100.4F / 38C).

## 2017-03-21 IMAGING — CR DG FOOT COMPLETE 3+V*L*
3 series · 3 of 3 positions shown · non-contrast
Comparison: None.

CLINICAL DATA: Left foot pain and swelling after injury. Tripped
over a weight last evening. Pain across metatarsals and third and
fourth toes.

EXAM:
LEFT FOOT - COMPLETE 3+ VIEW

[x foot ap left]
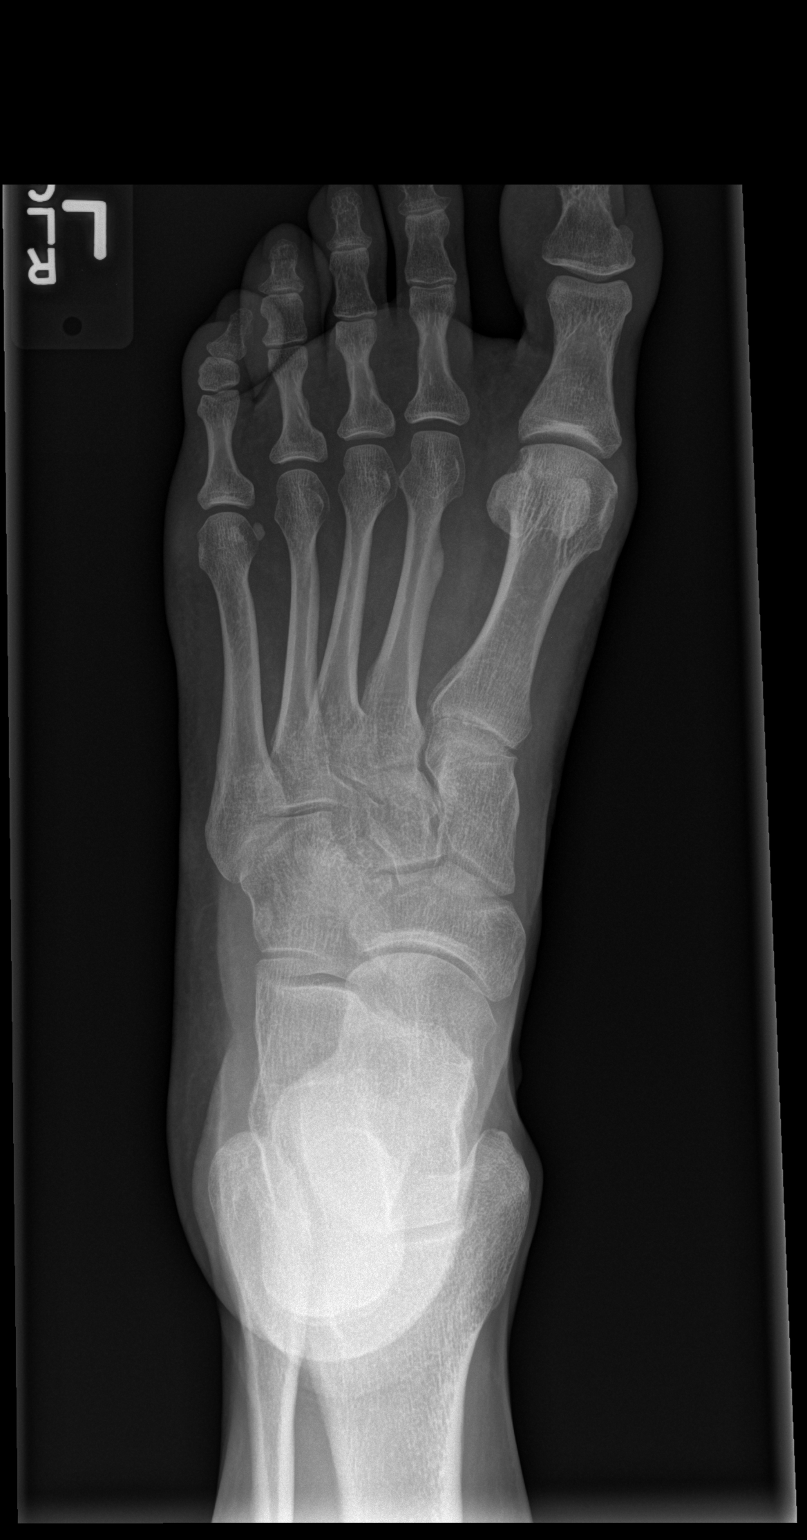

[x foot obl left]
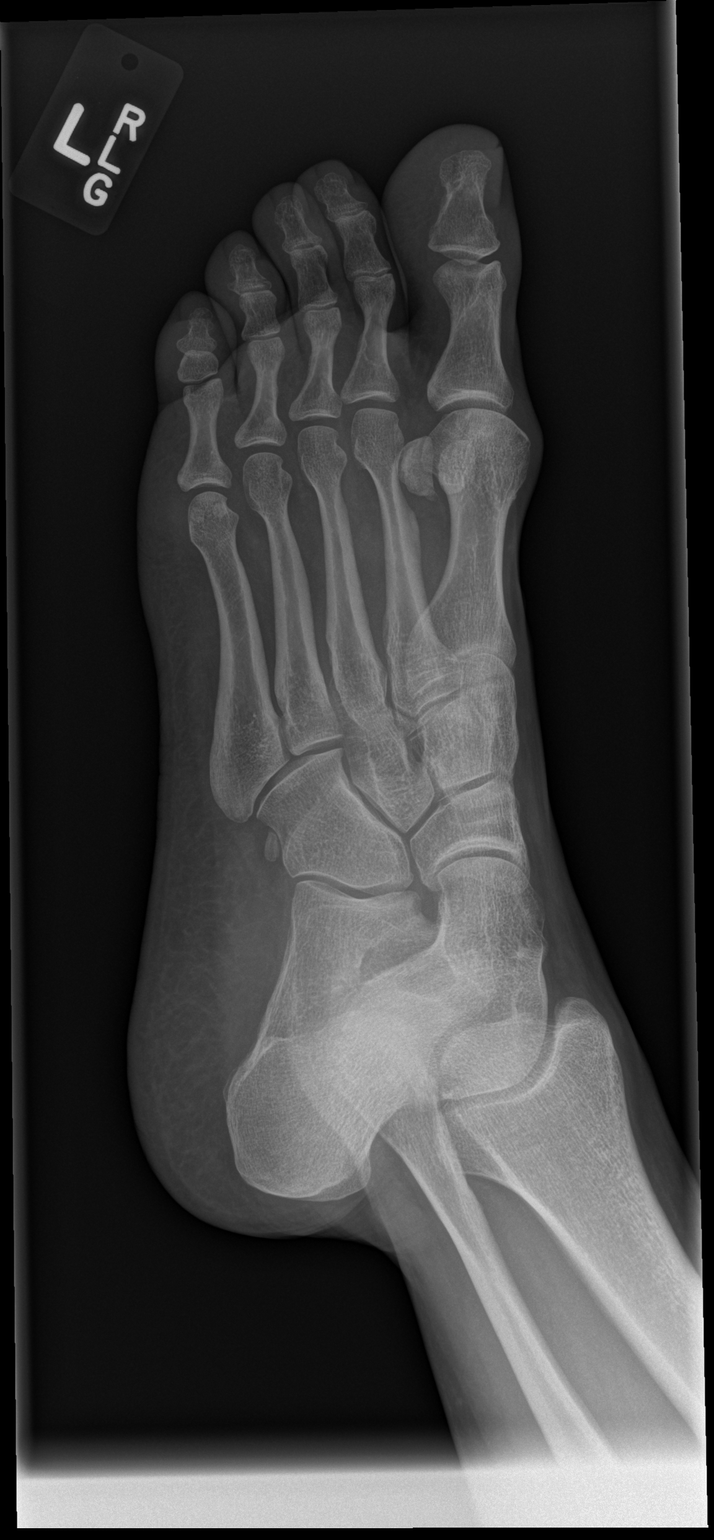

[x foot lat left]
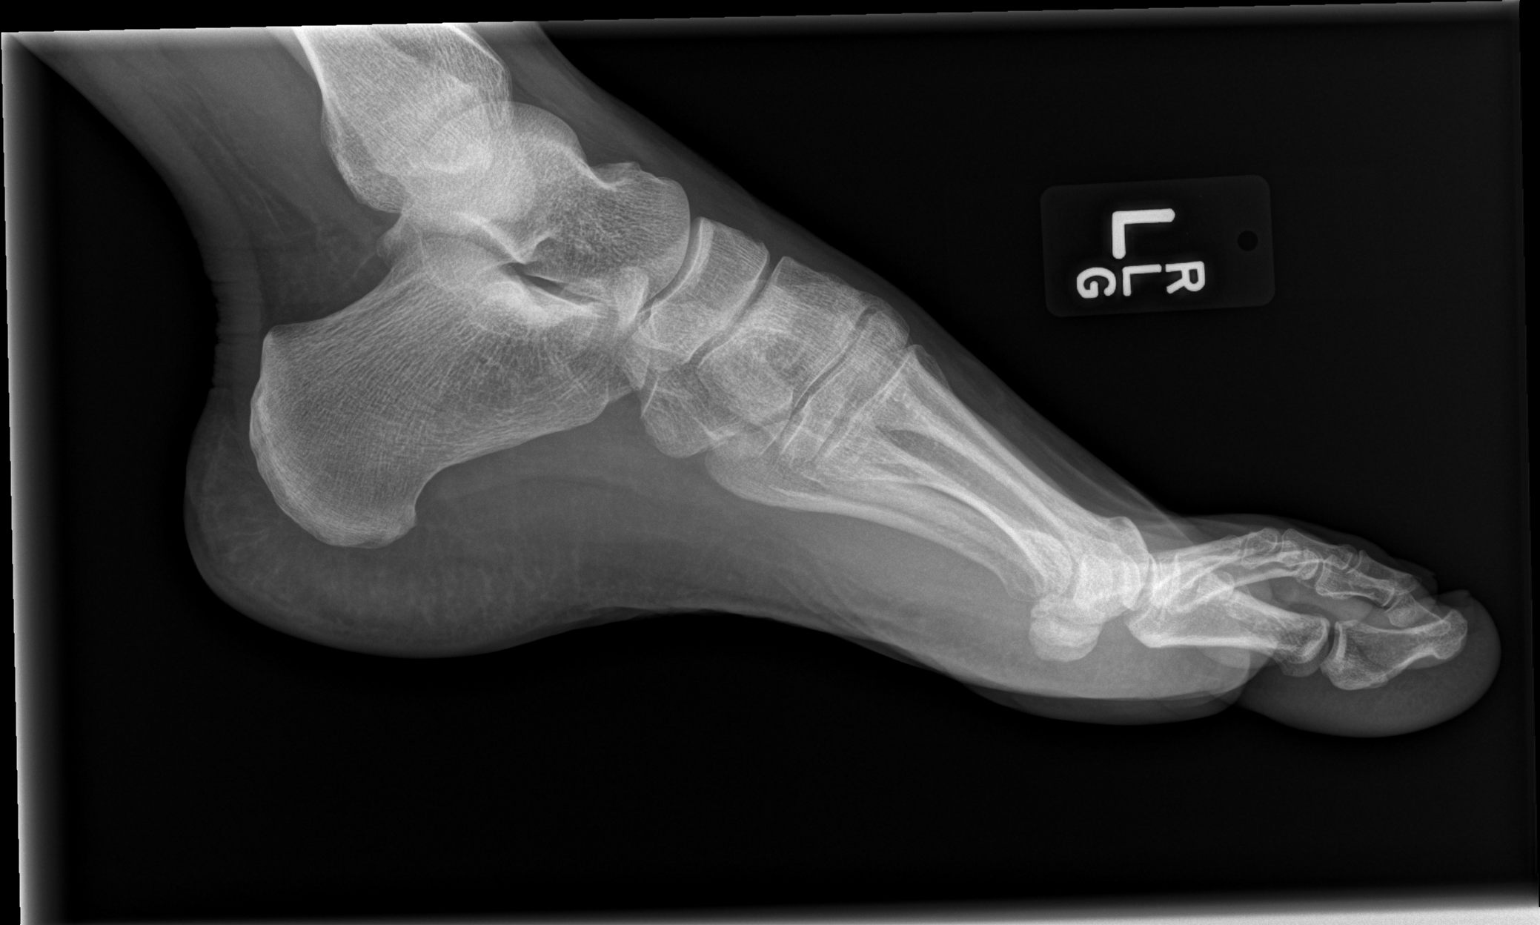

[3 of 3 positions shown; findings below may reference images not displayed]

FINDINGS: No fracture or dislocation. The alignment and joint spaces are
maintained. Particularly, the metatarsals, third and fourth toes are
intact. Soft tissue edema noted about the fourth toe.
IMPRESSION: No fracture or subluxation of the foot.

## 2017-08-29 ENCOUNTER — Ambulatory Visit: Attending: Internal Medicine

## 2017-08-29 DIAGNOSIS — R5383 Other fatigue: Secondary | ICD-10-CM | POA: Insufficient documentation

## 2017-08-29 LAB — CORTISOL: Cortisol: 12.5 ug/dL

## 2017-08-31 LAB — METHYLENETETRAHYDROFOLATE REDUCTASE

## 2017-09-03 ENCOUNTER — Emergency Department

## 2017-09-03 ENCOUNTER — Emergency Department
Admission: EM | Admit: 2017-09-03 | Discharge: 2017-09-03 | Disposition: A | Discharge status: Home / Self Care | Attending: Emergency Medicine | Admitting: Emergency Medicine

## 2017-09-03 DIAGNOSIS — F17210 Nicotine dependence, cigarettes, uncomplicated: Secondary | ICD-10-CM | POA: Insufficient documentation

## 2017-09-03 DIAGNOSIS — K219 Gastro-esophageal reflux disease without esophagitis: Secondary | ICD-10-CM | POA: Insufficient documentation

## 2017-09-03 DIAGNOSIS — J069 Acute upper respiratory infection, unspecified: Secondary | ICD-10-CM | POA: Insufficient documentation

## 2017-09-03 DIAGNOSIS — Z79899 Other long term (current) drug therapy: Secondary | ICD-10-CM | POA: Insufficient documentation

## 2017-09-03 DIAGNOSIS — J45909 Unspecified asthma, uncomplicated: Secondary | ICD-10-CM | POA: Insufficient documentation

## 2017-09-03 LAB — COMPREHENSIVE METABOLIC PANEL
ALT: 29 U/L (ref 0–55)
AST (SGOT): 20 U/L (ref 5–34)
Albumin/Globulin Ratio: 1.5 (ref 0.9–2.2)
Albumin: 3.8 g/dL (ref 3.5–5.0)
Alkaline Phosphatase: 65 U/L (ref 37–106)
Anion Gap: 10 (ref 5.0–15.0)
BUN: 8 mg/dL (ref 7.0–19.0)
Bilirubin, Total: 0.3 mg/dL (ref 0.2–1.2)
CO2: 22 mEq/L (ref 22–29)
Calcium: 9 mg/dL (ref 8.5–10.5)
Chloride: 108 mEq/L (ref 100–111)
Creatinine: 0.9 mg/dL (ref 0.6–1.0)
Globulin: 2.6 g/dL (ref 2.0–3.6)
Glucose: 101 mg/dL — ABNORMAL HIGH (ref 70–100)
Potassium: 4.1 mEq/L (ref 3.5–5.1)
Protein, Total: 6.4 g/dL (ref 6.0–8.3)
Sodium: 140 mEq/L (ref 136–145)

## 2017-09-03 LAB — CBC AND DIFFERENTIAL
Absolute NRBC: 0 10*3/uL
Basophils Absolute Automated: 0.05 10*3/uL (ref 0.00–0.20)
Basophils Automated: 0.8 %
Eosinophils Absolute Automated: 0.13 10*3/uL (ref 0.00–0.70)
Eosinophils Automated: 2 %
Hematocrit: 39.7 % (ref 37.0–47.0)
Hgb: 13.7 g/dL (ref 12.0–16.0)
Immature Granulocytes Absolute: 0.01 10*3/uL
Immature Granulocytes: 0.2 %
Lymphocytes Absolute Automated: 1.37 10*3/uL (ref 0.50–4.40)
Lymphocytes Automated: 21.5 %
MCH: 32.2 pg — ABNORMAL HIGH (ref 28.0–32.0)
MCHC: 34.5 g/dL (ref 32.0–36.0)
MCV: 93.2 fL (ref 80.0–100.0)
MPV: 9.9 fL (ref 9.4–12.3)
Monocytes Absolute Automated: 0.55 10*3/uL (ref 0.00–1.20)
Monocytes: 8.6 %
Neutrophils Absolute: 4.26 10*3/uL (ref 1.80–8.10)
Neutrophils: 66.9 %
Nucleated RBC: 0 /100 WBC (ref 0.0–1.0)
Platelets: 217 10*3/uL (ref 140–400)
RBC: 4.26 10*6/uL (ref 4.20–5.40)
RDW: 13 % (ref 12–15)
WBC: 6.37 10*3/uL (ref 3.50–10.80)

## 2017-09-03 LAB — IHS D-DIMER: D-Dimer: <0.27 ug/mL FEU (ref 0.00–0.70)

## 2017-09-03 LAB — GFR: EGFR: >60

## 2017-09-03 LAB — GROUP A STREP, RAPID ANTIGEN: Group A Strep, Rapid Antigen: NEGATIVE

## 2017-09-03 NOTE — Discharge Instructions (Signed)
Increase fluids and rest.    OTC decongestant and FLONASE.    Motrin 600mg  every 6 hours x 3-5 days.     Upper Respiratory Infection    You have been diagnosed with a viral upper respiratory infection, often called a "URI."    The symptoms of a viral upper respiratory infection may include fever (temperature higher than 100.55F / 38C), runny nose, congestion and sinus fullness, facial pain, earache, sore throat, cough, and occasionally wheezing. The symptoms usually improve within 3 to 4 days. It might take up to 10 days before you feel completely better.    URI's are treated with fluids, rest, and medication for fever and pain. Sometimes decongestants, cough medications or-medications for wheezing can also help.    Antibiotics have NO effect whatsoever on viruses and ARE NOT needed for a URI. Taking antibiotics when they are not necessary can cause side effects (like diarrhea or allergic reactions). It can also cause resistance, meaning antibiotics won't work when you need them in the future.    YOU SHOULD SEEK MEDICAL ATTENTION IMMEDIATELY, EITHER HERE OR AT THE NEAREST EMERGENCY DEPARTMENT, IF ANY OF THE FOLLOWING OCCURS:   You have new or worse symptoms or concerns.   You are short of breath.   You have a severe headache, stiff neck, confusion, or problems thinking.   You have nasal discharge, fever (temperature higher than 100.55F / 38C), or productive cough (one that brings up mucous from the lungs) lasting more that 10 days. Occasionally a viral cold may lead into a bacterial infection, such as a sinus infection, ear infection or pneumonia. Taking antibiotics during the cold will NOT prevent these infections, but if a secondary infection occurs, you might require additional treatment.    If you can't follow up with your doctor, or if at any time you feel you need to be rechecked or seen again, come back here or go to the nearest emergency department.

## 2017-09-03 NOTE — ED Provider Notes (Signed)
Pt was seen and evaluated by mid level provider. Case discussed with me. I agree with assessment and plan.    I have also personally seen and evaluated this patient      Sore throat, swollen glands, myalgia, cough. Lucila Maine was sick this past week.    No posterior pharyngeal exudate, no tonsillar swelling  Lungs clear to auscultation bilaterally     Dorothey Baseman, MD  09/03/17 1534

## 2017-09-03 NOTE — ED Provider Notes (Signed)
Physician/Midlevel provider first contact with patient: 09/03/17 1440         EMERGENCY DEPARTMENT HISTORY AND PHYSICAL EXAM    Date Time: 09/03/17 3:59 PM  Patient Name: Monica, Turner  Mid level Provider: Carma Leaven, PA-C    History of Presenting Illness:     Chief Complaint: ST, head cold, congestion, cough, SOB  History obtained from: Patient.  Onset/Duration: 3 days  Quality:   Severity:moderate  Aggravating Factors: none  Alleviating Factors: none  Associated Symptoms: chills, body aches.   Narrative/Additional Historical Findings:Monica Turner is a 56 y.o. female  Presents to ED with cough, ST, swollen glands since Thursday.  Symptoms have progressively worsened.   Today pt with mild mucous production with cough, chills and body aches with congestion and head cold. Pt noting shortness and breath and dizziness intermittently for a few wks, but worse the past 2 days.  Pt denies fever or wheezing.     Pts grandson lives with her, and he tested positive for flu and strep early this week.    Pt has been traveling by car to [facility] the past few wks, 7+ hour drives.     Pt is asthmatic, no prophylactic medication.  Has not been treated for 10 years.  However, was seen a few wks for URI like symptoms, and was given adviar.  She took one advair dose today; pt is non diabetic.     Nursing notes from this date of service were reviewed.    Past Medical History:     Past Medical History:   Diagnosis Date   . Anxiety    . Arthritis     Neck Right Hand   . Asthma without status asthmaticus    . Claustrophobia    . Depression    . Gastroesophageal reflux disease    . Headache(784.0)     Sinus   . Irritable bowel syndrome    . Low back pain    . Other plastic surgery for unacceptable cosmetic appearance    . Sciatica of left side    . Seasonal allergies    . Sleep apnea    . Smokes with greater than 40 pack year history     Quit 2 weks ago.     Immunizations:    Past Surgical History:     Past Surgical History:    Procedure Laterality Date   . ABDOMINOPLASTY, LIPOSUCTION (COSMETIC)  2009   . CYSTOSCOPY, URETEROSCOPY  2015   . EGD, COLONOSCOPY  2015   . FACELIFT, (RHYTIDECTOMY) (COSMETIC)  02/06/2014    Procedure: FACELIFT, (RHYTIDECTOMY) (COSMETIC);  Surgeon: Katheren Puller, MD;  Location: [facility] OR;  Service: Plastics;  Laterality: Bilateral;  RHYTIDECTOMY (COSMETIC)    . HYSTEROSCOPY, ENDOMETRIAL ABLATION, THERMACHOICE  2015   . LIPOSUCTION, NECK (COSMETIC)  2009       Family History:     Family History   Problem Relation Age of Onset   . Heart disease Mother    . Heart disease Father    . Cancer Maternal Grandfather    . Anesthesia problems Sister        Social History:     Social History     Social History   . Marital status: Married     Spouse name: N/A   . Number of children: N/A   . Years of education: N/A     Social History Main Topics   . Smoking status: Current Every Day Smoker  Packs/day: 0.00     Years: 40.00     Types: Cigarettes   . Smokeless tobacco: Never Used   . Alcohol use No   . Drug use: No   . Sexual activity: Not Currently     Other Topics Concern   . Not on file     Social History Narrative   . No narrative on file       Allergies:   No Known Allergies    Medications:   No current facility-administered medications for this encounter.     Current Outpatient Prescriptions:   .  buPROPion XL (WELLBUTRIN XL) 150 MG 24 hr tablet, TAKE ONE TABLET BY MOUTH EVERY DAY, Disp: , Rfl: 1  .  DULoxetine (CYMBALTA) 30 MG capsule, Take 90 mg by mouth daily.  , Disp: , Rfl:   .  esomeprazole (NEXIUM) 40 MG capsule, Take 40 mg by mouth every morning before breakfast., Disp: , Rfl:   .  Probiotic Product (PROBIOTIC DAILY PO), Take 2 tablets by mouth daily., Disp: , Rfl:     Review of Systems:   Constitutional: no fever + change in activity.  Eyes: No eye redness. No eye discharge.  ENT: +congestion; No ear pain +sore throat  Cardiovascular: No cp or palpitations  Respiratory: +cough +shortness of  breath.  GI: No vomiting or diarrhea.  Genitourinary: Normal urination frequency  Musculoskeletal: No extremity pain or decreased use  Skin: no rash or skin lesions.  Neurologic: Normal level of alertness  Psychiatric:  All other systems reviewed and are negative  Physical Exam:     ED Triage Vitals [09/03/17 1439]   Enc Vitals Group      BP (!) 126/96      Heart Rate 95      Resp Rate 17      Temp 98.4 F (36.9 C)      Temp Source Oral      SpO2 97 %      Weight 77.1 kg      Height 1.676 m      Head Circumference       Peak Flow       Pain Score 4      Pain Loc       Pain Edu?       Excl. in GC?      Constitutional: Vital signs reviewed. Well hydrated, well perfused, and no increased work of breathing. Appearance: well, mildly ill appearing.  Head:  Normocephalic, atraumatic  Eyes: No conjunctival injection. No discharge. EOMI  ENT: Mildly erythematous pharynx. No tonsillar exudate or swelling. Mucous membranes moist, No oral lesions, TMs wnl  Neck: Normal range of motion. Non-tender.  Respiratory/Chest: Clear to auscultation. No respiratory distress. No wheeze or crackles  Cardiovascular: Regular rate and rhythm. No murmur.   Abdomen: Soft and non-tender. No masses or hepatosplenomegaly.  Genitourinary:  UpperExtremity: No edema or cyanosis.  Moving well.  LowerExtremity: No edema or cyanosis.  Moving well.  Neurological: No focal motor deficits by observation. Speech normal. Memory normal.  Skin: Warm and dry. No rash.  Lymphatic: No cervical lymphadenopathy.  Psychiatric: Normal affect. Normal concentration.    Labs:     Labs Reviewed   CBC AND DIFFERENTIAL - Abnormal; Notable for the following:        Result Value    MCH 32.2 (*)     All other components within normal limits   COMPREHENSIVE METABOLIC PANEL - Abnormal; Notable for the following:  Glucose 101 (*)     All other components within normal limits   GROUP A STREP, RAPID ANTIGEN   RAPID INFLUENZA A/B ANTIGENS    Narrative:     ORDER#: [id number]                                     ORDERED BY: Bishop Limbo, Lyvonne Cassell  SOURCE: Nasal Aspirate                               COLLECTED:  09/03/17 14:43  ANTIBIOTICS AT COLL.:                                RECEIVED :  09/03/17 14:51  Influenza Rapid Antigen A&B                FINAL       09/03/17 15:20  09/03/17   Negative for Influenza A and B             Reference Range: Negative     THROAT CULTURE   I[facility] D-DIMER   GFR         Rads:     Radiology Results (24 Hour)     Procedure Component Value Units Date/Time    XR Chest 2 Views [[id number]] Collected:  09/03/17 1544    Order Status:  Completed Updated:  09/03/17 1549    Narrative:       CLINICAL HISTORY: Shortness of breath and cough    COMPARISON: 01/02/2016    TECHNIQUE: PA and lateral views of the chest were performed.    FINDINGS:    Left upper lobe granuloma. The lungs are otherwise clear. The heart size  is normal. Mediastinal contours are unremarkable.      Impression:         1. No acute cardiopulmonary process.    Launa Flight, MD   09/03/2017 3:45 PM          MDM and ED Course   I, Carma Leaven, PA-C, have been the primary provider for Monica Turner during this Emergency Dept visit.  Nursing notes, PMH, SH reviewed.   The attending signature signifies review and agreement of the history, physical examination, evaluation, clinical impression, and plan except as noted.     Oxygen saturation by pulse oximetry is 95%-100%, Normal.  Interventions: None Needed.      DDX  URI, viral illness, flu, pharyngitis, asthma exacerbation, bronchitis; low susp for PE, and normal dimer. No add'l imaging indicated.     Pt looks well, nml sats/vitals.  Flu, strep negative.  Supportive care measures and follow up.   Pt seen and examined by Dr.shinashin, agrees with care and plan.     Assessment/Plan:   Results and instructions reviewed at the bedside with patient and family.    Clinical Impression  Final diagnoses:   Viral URI with cough       Disposition  ED Disposition     ED  Disposition Condition Date/Time Comment    Discharge  Sun Sep 03, 2017  3:59 PM Monica Turner discharge to home/self care.    Condition at disposition: Stable          Prescriptions  New Prescriptions    No medications on file  Signed by: Phillips Grout, PA-C         Phillips Grout, [place]  09/03/17 [phone]

## 2017-09-03 NOTE — ED Triage Notes (Signed)
Breathing unlabored and even. Skin warm dry and pink. Pt alert and oriented. C/o sore throat and cough since thrusday

## 2017-09-06 ENCOUNTER — Other Ambulatory Visit: Payer: Self-pay | Admitting: Gynecology

## 2017-09-06 ENCOUNTER — Other Ambulatory Visit
Admission: RE | Admit: 2017-09-06 | Discharge: 2017-09-06 | Disposition: A | Discharge status: Home / Self Care | Source: Ambulatory Visit | Attending: Gynecology | Admitting: Gynecology

## 2017-09-06 DIAGNOSIS — Z01419 Encounter for gynecological examination (general) (routine) without abnormal findings: Secondary | ICD-10-CM

## 2017-09-08 LAB — VH HPV DNA, HIGH RISK, REFLEX GENOTYPE IF POSITIVE: HPV DNA, high risk: NEGATIVE

## 2017-09-19 ENCOUNTER — Emergency Department
Admission: EM | Admit: 2017-09-19 | Discharge: 2017-09-19 | Disposition: A | Discharge status: Home / Self Care | Attending: Emergency Medicine | Admitting: Emergency Medicine

## 2017-09-19 ENCOUNTER — Emergency Department

## 2017-09-19 DIAGNOSIS — F17210 Nicotine dependence, cigarettes, uncomplicated: Secondary | ICD-10-CM | POA: Insufficient documentation

## 2017-09-19 DIAGNOSIS — S93602A Unspecified sprain of left foot, initial encounter: Secondary | ICD-10-CM | POA: Insufficient documentation

## 2017-09-19 DIAGNOSIS — Y9301 Activity, walking, marching and hiking: Secondary | ICD-10-CM

## 2017-09-19 DIAGNOSIS — K219 Gastro-esophageal reflux disease without esophagitis: Secondary | ICD-10-CM | POA: Insufficient documentation

## 2017-09-19 DIAGNOSIS — Z79899 Other long term (current) drug therapy: Secondary | ICD-10-CM | POA: Insufficient documentation

## 2017-09-19 DIAGNOSIS — X501XXA Overexertion from prolonged static or awkward postures, initial encounter: Secondary | ICD-10-CM | POA: Insufficient documentation

## 2017-09-19 HISTORY — DX: Homocystinuria: E72.11

## 2017-09-19 HISTORY — DX: Methylenetetrahydrofolate reductase deficiency: E72.12

## 2017-09-19 MED ORDER — ACETAMINOPHEN 325 MG PO TABS
650.00 mg | ORAL_TABLET | Freq: Once | ORAL | Status: AC
Start: 2017-09-19 — End: 2017-09-19
  Administered 2017-09-19: 10:00:00 650 mg via ORAL
  Filled 2017-09-19: qty 2

## 2017-09-19 NOTE — Discharge Instructions (Signed)
R.I.C.E.     Some things you can do to help your injury are: Resting, Icing, Compressing and Elevating the injured area. Remember this as "RICE."  · REST: Limit the use of the injured body part.  · ICE: By applying ice to the affected area, swelling and pain can be reduced. Place some ice cubes in a re-sealable (Ziploc®) bag and add some water. Put a thin washcloth between the bag and the skin. Apply the ice bag to the area for at least 20 minutes. Do this at least 4 times per day. It is okay to do this more often than directed. You can also do it for longer than directed. NEVER APPLY ICE DIRECTLY TO THE SKIN.  · COMPRESS: Compression means to apply pressure around the injured area such as with a splint, cast or an ACE® bandage. Compression decreases swelling and improves comfort. Compression should be tight enough to relieve swelling but not so tight as to decrease circulation. Increasing pain, numbness, tingling, or change in skin color, are all signs of decreased circulation.  · ELEVATE: Elevate the injured part. For example, elevate your foot by placing it on a chair while sitting, or propping it up on pillows when lying down.             Foot Sprain     You have been diagnosed with a sprain of your foot.  · A sprain of the foot is an injury to the foot's ligaments. It IS NOT the same as a sprained ankle.     A sprain is an injury to a ligament (a type of connective tissue). It is usually a tear or partial tear. Sprains can be painful as broken bones. Sprains can be divided into 3 categories. The category depends on the amount of injury to the ligaments. A first degree sprain is considered a minor tear. With a third degree sprain, there is often a chip fracture of the bone that the ligament is attached to.     Generally, sprain treatment includes the use of pain medicine and a splint to reduce movement. Treatment also involves Resting, Icing, Compressing and Elevating the injured area. Remember this as  "RICE."  · REST: Limit the use of the injured body part.  · ICE: By applying ice to the affected area, swelling and pain can be reduced. Place some ice cubes in a re-sealable (Ziploc®) bag and add some water. Put a thin washcloth between the bag and the skin. Apply the ice bag to the area for at least 20 minutes. Do this at least 4 times per day. Using the ice for longer times and more frequently is OK. NEVER APPLY ICE DIRECTLY TO THE SKIN.  · COMPRESS: Compression means to apply pressure around the injured area such as with a splint, cast or an ACE® bandage. Compression decreases swelling and improves comfort. Compression should be tight enough to relieve swelling but not so tight as to decrease circulation. Increasing pain, numbness, tingling, or change in skin color, are all signs of decreased circulation.  · ELEVATE: Elevate the injured part. For example, a leg can be elevated by placing the leg on a chair while sitting and propped up on pillows while lying down.     You have been given an ACE® BANDAGE to wear. Wear the ACE® bandage to compress (keep light pressure on) the injured area. This increases comfort. It also keeps swelling down. The ACE® bandage should fit snugly. It should not be so tight that it lowers circulation (blood   flow). Watch for swelling of the area past the ACE® wrap. Check capillary refill (circulation) in the nail beds. Do this by pressing on the nail bed. It should turn white when you press on it. It should then get pink again in less than 2 seconds after you let go. Loosen the wrap if it is too tight.     Wear the ACE® bandage  · As needed for comfort.     YOU SHOULD SEEK MEDICAL ATTENTION IMMEDIATELY, EITHER HERE OR AT THE NEAREST EMERGENCY DEPARTMENT, IF ANY OF THE FOLLOWING OCCURS:  · There is a severe increase in pain in the injured area.  · You have new numbness or tingling in or below the injured area.  · Your foot gets cold and pale. This could mean that the foot has a problem  with its blood supply.

## 2017-09-19 NOTE — ED Provider Notes (Signed)
Physician/Midlevel provider first contact with patient: 09/19/17 0951         History     Chief Complaint   Patient presents with   . Foot Injury     HPI     DR. DARWIN Mikiah Demond  is the primary attending for this patient and performed the HPI, PE, and medical decision making for this patient.    Patient Name: Monica Turner, 56 y.o., female    History obtained by: Patient    Chief Complaint: foot pain  Onset: yesterday  Context: pt was walking, doing laundry, "twisted [her] foot wrong.  H/o prior foot injury  Location: L lateral foot  Severity: 6/10  Quality: achy  Timing: constant  Progression:  worsening  Radiation:  none  Alleviating Factors: remaining still  Ineffective Treatments:  None tried  Exacerbating Factors: movement, palpation, ambulation  Associated Signs and Symptoms: denies anticoag meds/fall/abd pain/ha/neck pain/loc/bleed/inability to ambulate.      HPI: L foot pain yesterday.  pt was walking, doing laundry, "twisted [her] foot wrong.  H/o prior foot injury. Sx worsening.  denies anticoag meds/fall/abd pain/ha/neck pain/loc/bleed/inability to ambulate.      Past Medical History:   Diagnosis Date   . Anxiety    . Arthritis     Neck Right Hand   . Asthma without status asthmaticus    . Claustrophobia    . Depression    . Gastroesophageal reflux disease    . Headache(784.0)     Sinus   . Irritable bowel syndrome    . Low back pain    . MTHFR (methylene THF reductase) deficiency and homocystinuria    . Other plastic surgery for unacceptable cosmetic appearance    . Sciatica of left side    . Seasonal allergies    . Sleep apnea    . Smokes with greater than 40 pack year history     Quit 2 weks ago.       Past Surgical History:   Procedure Laterality Date   . ABDOMINOPLASTY, LIPOSUCTION (COSMETIC)  2009   . CYSTOSCOPY, URETEROSCOPY  2015   . EGD, COLONOSCOPY  2015   . FACELIFT, (RHYTIDECTOMY) (COSMETIC)  02/06/2014    Procedure: FACELIFT, (RHYTIDECTOMY) (COSMETIC);  Surgeon: Katheren Puller,  MD;  Location: [facility] OR;  Service: Plastics;  Laterality: Bilateral;  RHYTIDECTOMY (COSMETIC)    . HYSTEROSCOPY, ENDOMETRIAL ABLATION, THERMACHOICE  2015   . LIPOSUCTION, NECK (COSMETIC)  2009       Family History   Problem Relation Age of Onset   . Heart disease Mother    . Heart disease Father    . Cancer Maternal Grandfather    . Anesthesia problems Sister        Social  Social History   Substance Use Topics   . Smoking status: Current Every Day Smoker     Packs/day: 0.00     Years: 40.00     Types: Cigarettes   . Smokeless tobacco: Never Used   . Alcohol use No       .     No Known Allergies    Home Medications             buPROPion XL (WELLBUTRIN XL) 150 MG 24 hr tablet     TAKE ONE TABLET BY MOUTH EVERY DAY     DULoxetine (CYMBALTA) 30 MG capsule     Take 90 mg by mouth daily.         esomeprazole (NEXIUM) 40  MG capsule     Take 40 mg by mouth every morning before breakfast.     Hormone Cream Base Cream     by Does not apply route.Hormone patch     Probiotic Product (PROBIOTIC DAILY PO)     Take 2 tablets by mouth daily.           Review of Systems   Constitutional: Negative for chills and fever.   HENT: Negative for trouble swallowing and voice change.    Eyes: Negative for pain and visual disturbance.   Respiratory: Negative for chest tightness and shortness of breath.    Cardiovascular: Negative for chest pain and palpitations.   Gastrointestinal: Negative for abdominal pain and nausea.   Genitourinary: Negative for dysuria and flank pain.   Musculoskeletal: Positive for arthralgias, gait problem, joint swelling and myalgias. Negative for back pain, neck pain and neck stiffness.   Skin: Negative for color change and rash.   Neurological: Negative for light-headedness and headaches.   All other systems reviewed and are negative.      Physical Exam    BP: 124/87, Heart Rate: 80, Temp: 97.7 F (36.5 C), Resp Rate: 18, SpO2: 99 %    Physical Exam   Constitutional: She is oriented to person, place, and  time. She appears well-developed. No distress.   HENT:   Head: Normocephalic.   Right Ear: External ear normal.   Left Ear: External ear normal.   Eyes: Pupils are equal, round, and reactive to light. Conjunctivae and EOM are normal.   Neck: Normal range of motion. Neck supple.   Cardiovascular: Normal rate, regular rhythm, normal heart sounds and intact distal pulses.    Pulmonary/Chest: Effort normal and breath sounds normal. No respiratory distress.   Abdominal: Soft. There is no tenderness.   Musculoskeletal: Normal range of motion.        Left ankle: Normal.        Left foot: There is tenderness and swelling. There is normal capillary refill, no deformity and no laceration.        Feet:    Neurological: She is alert and oriented to person, place, and time. She has normal strength. No cranial nerve deficit.   Skin: Skin is warm. No rash noted. No pallor.   Nursing note and vitals reviewed.        MDM and ED Course     ED Medication Orders     Start Ordered     Status Ordering Provider    09/19/17 [phone] 09/19/17 0954  acetaminophen (TYLENOL) tablet 650 mg  Once     Route: Oral  Ordered Dose: 650 mg     Last MAR action:  Given Janson Lamar, DARWIN-DEAN TABIOS             MDM  Number of Diagnoses or Management Options  Foot sprain, left, initial encounter:   Injury while walking indoors:   Diagnosis management comments: Pt has prior L 2nd metatarsal fx.  No acute fx and no ttp along 2nd metatarsal.    RICE.    Possibilities causing patient symptoms are discussed with the patient as is work up and evaluation in emergency department.  Pt questions are answered and results reviewed with the patient.  Disposition plan is discussed as well.  Instructed pt to return if sx worsen, persist, or if new sx develop. Pt understands and agrees with plan. Pt doing well upon discharge, aaox3, NAD, ambulates.         Amount  and/or Complexity of Data Reviewed  Tests in the radiology section of CPT: ordered and reviewed  Review and  summarize past medical records: yes  Independent visualization of images, tracings, or specimens: yes    Patient Progress  Patient progress: improved              Results     ** No results found for the last 24 hours. **        Radiology Results (24 Hour)     Procedure Component Value Units Date/Time    Foot Left AP Lateral And Oblique [[id number]] Collected:  09/19/17 1032    Order Status:  Completed Updated:  09/19/17 1041    Narrative:       History: Left lateral foot pain, swelling and bruising status post  injury yesterday.    FINDINGS: 3 views of the left foot were obtained. There is a focal area  of cortical thickening along the medial aspect of the mid to distal  second metatarsal. This is probably chronic. Differential would include  stress type reaction but no acute fracture is seen of the second  metatarsal. No fractures are seen laterally in the foot either. Slight  degenerative change of the first metatarsal head is identified.      Impression:        Focal cortical thickening in the second metatarsal could be  due to stress type reaction without complete fracture or chronic change.  Clinical correlation recommended regarding any associated pain at this  time. No acute fracture identified.    Will Bonnet, MD   09/19/2017 10:37 AM              This note was generated by the Epic EMR system/Dragon speech recognition and may contain inherent errors or omissions not intended by the user. Grammatical errors, random word insertions, deletions, pronoun errors and incomplete sentences are occasional consequences of this technology due to software limitations. Not all errors are caught or corrected. If there are questions or concerns about the content of this note or information contained within the body of this dictation they should be addressed directly with the author for clarification.      Procedures    Clinical Impression & Disposition     Clinical Impression  Final diagnoses:   Injury while walking  indoors   Foot sprain, left, initial encounter        ED Disposition     ED Disposition Condition Date/Time Comment    Discharge  Tue Sep 19, 2017 10:47 AM Doralee Albino Snavely discharge to home/self care.    Condition at disposition: Stable           New Prescriptions    No medications on file                 Guadalupe Maple, MD  09/19/17 1048

## 2017-09-19 NOTE — ED Triage Notes (Signed)
Pt came in through triage with c/o left lateral foot pain. Pt states she rolled her foot yesterday, 09/18/2017, while in her laundry room

## 2017-12-25 ENCOUNTER — Other Ambulatory Visit: Payer: Self-pay | Admitting: Internal Medicine

## 2017-12-25 ENCOUNTER — Ambulatory Visit
Admission: RE | Admit: 2017-12-25 | Discharge: 2017-12-25 | Disposition: A | Discharge status: Home / Self Care | Source: Ambulatory Visit | Attending: Internal Medicine | Admitting: Internal Medicine

## 2017-12-25 DIAGNOSIS — T1490XA Injury, unspecified, initial encounter: Secondary | ICD-10-CM | POA: Insufficient documentation

## 2018-04-11 ENCOUNTER — Other Ambulatory Visit: Payer: Self-pay | Admitting: Internal Medicine

## 2018-04-18 ENCOUNTER — Other Ambulatory Visit: Payer: Self-pay

## 2018-04-19 ENCOUNTER — Other Ambulatory Visit: Payer: Self-pay

## 2018-05-21 ENCOUNTER — Emergency Department
Admission: EM | Admit: 2018-05-21 | Discharge: 2018-05-21 | Disposition: A | Discharge status: Home / Self Care | Attending: Emergency Medicine | Admitting: Emergency Medicine

## 2018-05-21 DIAGNOSIS — M7918 Myalgia, other site: Secondary | ICD-10-CM

## 2018-05-21 DIAGNOSIS — M545 Low back pain: Secondary | ICD-10-CM | POA: Insufficient documentation

## 2018-05-21 DIAGNOSIS — Z79899 Other long term (current) drug therapy: Secondary | ICD-10-CM | POA: Insufficient documentation

## 2018-05-21 MED ORDER — KETOROLAC TROMETHAMINE 30 MG/ML IJ SOLN
60.00 mg | Freq: Once | INTRAMUSCULAR | Status: AC
Start: 2018-05-21 — End: 2018-05-21
  Administered 2018-05-21: 13:00:00 60 mg via INTRAMUSCULAR
  Filled 2018-05-21: qty 2

## 2018-05-21 MED ORDER — PREDNISONE 20 MG PO TABS
40.00 mg | ORAL_TABLET | Freq: Every day | ORAL | 0 refills | Status: AC
Start: 2018-05-21 — End: 2018-05-26

## 2018-05-21 NOTE — ED Triage Notes (Signed)
Patient c/o chronic right buttock pain for past 6 years. Patient has seen multiple physicians for this but no one has found the cause. Patient states pain worse and is hoping to get an MRI done. Rates pain 4/10, non radiating, worse with sitting and movement.

## 2018-05-21 NOTE — ED Provider Notes (Signed)
Physician/Midlevel provider first contact with patient: 05/21/18 1203         History     Chief Complaint   Patient presents with   . Leg Pain     upper posterior leg/buttock pain     Patient presents the emergency department complaining of acute on chronic right buttock pain.  She states that the pain is been ongoing for about 6 years.  She has been recently had a local injection with steroids with modest improvement.  She has also been with muscle relaxants, pain medication, physical therapy multiple times, negative x-ray and sonogram.  She states the pain is currently 4/10, nonradiating.  Pain is provoked by sitting for prolonged periods of time, climbing stairs or ambulation.  She has seen her PCP and orthopedist.  Denies associated numbness or weakness.  No abdominal pain or flank pain.  No previous history of DVTs      The history is provided by the patient.   Leg Pain   Associated symptoms: no back pain and no neck pain             Past Medical History:   Diagnosis Date   . Anxiety    . Arthritis     Neck Right Hand   . Asthma without status asthmaticus    . Claustrophobia    . Depression    . Gastroesophageal reflux disease    . Headache(784.0)     Sinus   . Irritable bowel syndrome    . Low back pain    . MTHFR (methylene THF reductase) deficiency and homocystinuria    . Other plastic surgery for unacceptable cosmetic appearance    . Sciatica of left side    . Seasonal allergies    . Sleep apnea    . Smokes with greater than 40 pack year history     Quit 2 weks ago.       Past Surgical History:   Procedure Laterality Date   . ABDOMINOPLASTY, LIPOSUCTION (COSMETIC)  2009   . CYSTOSCOPY, URETEROSCOPY  2015   . EGD, COLONOSCOPY  2015   . FACELIFT, (RHYTIDECTOMY) (COSMETIC)  02/06/2014    Procedure: FACELIFT, (RHYTIDECTOMY) (COSMETIC);  Surgeon: Katheren Puller, MD;  Location: [facility] OR;  Service: Plastics;  Laterality: Bilateral;  RHYTIDECTOMY (COSMETIC)    . HYSTEROSCOPY, ENDOMETRIAL ABLATION,  THERMACHOICE  2015   . LIPOSUCTION, NECK (COSMETIC)  2009       Family History   Problem Relation Age of Onset   . Heart disease Mother    . Heart disease Father    . Cancer Maternal Grandfather    . Anesthesia problems Sister        Social  Social History   Substance Use Topics   . Smoking status: Current Every Day Smoker     Packs/day: 0.00     Years: 40.00     Types: Cigarettes   . Smokeless tobacco: Never Used   . Alcohol use No       .     No Known Allergies    Home Medications     Med List Status:  In Progress Set By: Ethel Rana, RN at 05/21/2018 12:14 PM                buPROPion XL (WELLBUTRIN XL) 150 MG 24 hr tablet     TAKE ONE TABLET BY MOUTH EVERY DAY     DULoxetine (CYMBALTA) 30 MG capsule     Take  40 mg by mouth daily         esomeprazole (NEXIUM) 40 MG capsule     Take 40 mg by mouth every morning before breakfast.     Hormone Cream Base Cream     by Does not apply route.Hormone patch     ibuprofen (ADVIL,MOTRIN) 200 MG tablet     Take 600 mg by mouth every 6 (six) hours as needed for Pain     Probiotic Product (PROBIOTIC DAILY PO)     Take 2 tablets by mouth daily.           Review of Systems   Constitutional: Negative.    HENT: Negative.    Respiratory: Negative.    Cardiovascular: Negative.    Gastrointestinal: Negative.    Genitourinary: Negative.    Musculoskeletal: Positive for arthralgias. Negative for back pain, gait problem, joint swelling, myalgias, neck pain and neck stiffness.   Neurological: Negative.    All other systems reviewed and are negative.      Physical Exam    BP: 110/78, Heart Rate: 84, Temp: 98.4 F (36.9 C), Resp Rate: 19, SpO2: 97 %, Weight: 80.1 kg    Physical Exam   Constitutional: She appears well-developed and well-nourished. She is active.  Non-toxic appearance. She does not have a sickly appearance. She does not appear ill. No distress.   HENT:   Mouth/Throat: Uvula is midline, oropharynx is clear and moist and mucous membranes are normal.   Neck: Neck supple.    Cardiovascular: S1 normal, S2 normal, normal heart sounds and normal pulses.    Pulmonary/Chest: No respiratory distress. She has no decreased breath sounds. She has no wheezes. She has no rhonchi. She has no rales.   Abdominal: Normal appearance and bowel sounds are normal. She exhibits no shifting dullness, no distension, no pulsatile liver, no fluid wave, no abdominal bruit, no ascites, no pulsatile midline mass and no mass. There is no hepatosplenomegaly, splenomegaly or hepatomegaly. There is no tenderness. There is no rigidity, no rebound, no guarding, no CVA tenderness, no tenderness at McBurney's point and negative Murphy's sign.   Musculoskeletal: She exhibits tenderness. She exhibits no edema or deformity.        Right hip: Normal.        Left hip: Normal.        Right knee: Normal.        Left knee: Normal.        Right ankle: Normal.        Left ankle: Normal.        Lumbar back: She exhibits tenderness. She exhibits normal range of motion, no bony tenderness, no swelling, no edema, no deformity, no laceration, no pain, no spasm and normal pulse.        Back:    No tenderness to right buttock, point pain to the ischial tuberosity and surrounding tissue.  There is no fluctuance erythema warmth or induration.   Neurological: She is alert. She has normal strength. She is not disoriented. She displays no atrophy and no tremor. No sensory deficit. She exhibits normal muscle tone. She displays no seizure activity. GCS eye subscore is 4. GCS verbal subscore is 5. GCS motor subscore is 6.   5/5 strength with resisted FROM, soft touch sensation preserved throughout extremities.  < 2 sec cap refill with bounding distal pulses.      Skin: Skin is warm. Capillary refill takes less than 2 seconds. She is not diaphoretic.   Psychiatric: She  has a normal mood and affect. Her behavior is normal. Judgment and thought content normal.   Vitals reviewed.        MDM and ED Course     ED Medication Orders     Start Ordered      Status Ordering Provider    05/21/18 1238 05/21/18 1237  ketorolac (TORADOL) injection 60 mg  Once     Route: Intramuscular  Ordered Dose: 60 mg     Last MAR action:  Given Rhayne Chatwin EDWIN             MDM  Number of Diagnoses or Management Options  Buttock pain: established and worsening  Diagnosis management comments:   I, Adelene Idler, Physician Assistant, have been the primary provider for this pt during their ED stay.     The attending signature signifies review and agreement of the history, physical examination, evaluation, clinical impression and plan except as noted.     02 sat is 96% on  ra, which is nml.     Patient with chronic right buttock pain.  No trauma, no overuse.  Patient with history of unremarkable x-rays and ultrasound.  High suspicion for acute on chronic tendinitis versus bursitis.    Plan for short course of oral steroids, patient requested Toradol injection.  Patient declines additional pain management.  Patient will continue to activity pending orthopedic follow-up for likely MRI/advanced imaging.    Pt given typical ED return precautions with worsening or changing symptoms, and verbalizes understanding of need for follow-up.     *This note was generated by the Epic EMR system/ Dragon speech recognition and may contain inherent errors or omissions not intended by the user. Grammatical errors, random word insertions, deletions, pronoun errors and incomplete sentences are occasional consequences of this technology due to software limitations. Not all errors are caught or corrected. If there are questions or concerns about the content of this note or information contained within the body of this dictation they should be addressed directly with the author for clarification           Amount and/or Complexity of Data Reviewed  Review and summarize past medical records: yes  Discuss the patient with other providers: yes    Risk of Complications, Morbidity, and/or Mortality  Presenting problems:  moderate  Diagnostic procedures: moderate  Management options: moderate    Patient Progress  Patient progress: stable                   Procedures    Clinical Impression & Disposition     Clinical Impression  Final diagnoses:   Buttock pain        ED Disposition     ED Disposition Condition Date/Time Comment    Discharge  Mon May 21, 2018 12:42 PM Posey Jasmin Zaring discharge to home/self care.    Condition at disposition: Stable           Discharge Medication List as of 05/21/2018 12:42 PM      START taking these medications    Details   predniSONE (DELTASONE) 20 MG tablet Take 2 tablets (40 mg total) by mouth daily for 5 days, Starting Mon 05/21/2018, Until Sat 05/26/2018, Normal                       Jamse Arn, [place]  05/21/18 1318

## 2018-05-21 NOTE — Discharge Instructions (Signed)
Hip Strain    You have been diagnosed with a hip strain.     A hip strain is an injury to the muscles or tendons of your hip. Injury can occur when these muscles or tendons get twisted, pulled, stretched, or torn. When a muscle is injured, it is called a strain. When a ligament is injured, it is called a sprain. Often doctors use the word strain or sprain for both types of injury. This is because the treatment for both is often the same. Most muscle strains happen when your muscle contracts too quickly or when it gets stretched too far.    Often, the injured muscle or tendon needs time for it to heal. Medical treatment of strains and sprains centers on decreasing inflammation and pain. This allows you to use your hip normally again more quickly.     To help with pain and inflammation, do the following for the first few days:     REST: Limit the use of the injured body part.    ICE: By applying ice to the affected area, swelling and pain can be reduced. Place some ice cubes in a re-sealable (Ziploc) bag and add some water. Put a thin washcloth between the bag and the skin. Apply the ice bag to the area for at least 20 minutes. Do this at least 4 times per day. Using the ice for longer times and more often is okay. NEVER APPLY ICE DIRECTLY TO THE SKIN.    COMPRESS: Compression means to apply pressure around the injured area such as with a splint, cast or an ACE bandage. Compression decreases swelling and improves comfort. Compression should be tight enough to relieve swelling but not so tight as to decrease circulation. Increasing pain, numbness, tingling, or change in skin color, are all signs of decreased circulation.    What to expect:   Keep using your injured hip. A good idea is to do gentle exercises several times a day. Do not do anything that gives you pain. The goal is to keep your joint moving and to decrease any stiffness.    If gentle exercises do not help your hip, talk to your doctor.  Physical therapy can help injured muscles and ligaments.   Do not play any sports until your doctor, or team doctor, says that you can. Hip strains can last for weeks. A step-by-step approach is the best way to getting back to playing sports.   If the muscles or tendons of your hip have been seriously damaged, you might need surgery. Follow up with your doctor to check how well you are recovering.   NSAIDs: Nonsteroidal anti-inflammatory (NSAID) medicines will help lessen the pain of inflammation and swelling. You can buy this type of medicine over the counter. Your doctor can also write a prescription. NSAIDs have some side-effects on your stomach and kidneys. Talk to your doctor about which medicine will be best for your pain.    YOU SHOULD SEEK MEDICAL ATTENTION IMMEDIATELY, EITHER HERE OR AT THE NEAREST EMERGENCY DEPARTMENT, IF ANY OF THE FOLLOWING OCCUR:     You have a fever (temperature higher than 100.4F or 38C).   You have new pain or pain not relieved by prescription medicine.   Your hip bone feels tender or you think it might be broken.   You can't walk because of the injury.   You have any other concerns about your medical condition.    If you can't talk with your doctor, or if   you feel you need to be rechecked, come back here or go to the nearest emergency department.

## 2018-06-14 ENCOUNTER — Other Ambulatory Visit: Payer: Self-pay

## 2018-07-06 ENCOUNTER — Ambulatory Visit (INDEPENDENT_AMBULATORY_CARE_PROVIDER_SITE_OTHER): Admitting: GYNECOLOGICAL/ONCOLOGY

## 2018-07-06 ENCOUNTER — Encounter (INDEPENDENT_AMBULATORY_CARE_PROVIDER_SITE_OTHER): Payer: Self-pay | Admitting: GYNECOLOGICAL/ONCOLOGY

## 2018-07-06 VITALS — BP 109/79 | HR 90 | Temp 98.1°F | Ht 64.75 in | Wt 178.5 lb

## 2018-07-06 DIAGNOSIS — D251 Intramural leiomyoma of uterus: Secondary | ICD-10-CM

## 2018-07-06 DIAGNOSIS — F17210 Nicotine dependence, cigarettes, uncomplicated: Secondary | ICD-10-CM

## 2018-07-06 NOTE — H&P (Signed)
OB/GYN ONCOLOGY [place], [place] [address]      GYNECOLOGIC ONCOLOGY   Name: Julie Browning MRN:  [id number]   Date: 07/06/2018 Age: 57 y.o.         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  Patient's history is dated from her MRI of November 19, 2014 where she went an MRI for left-sided pelvic pain for 2 months at that time there was noted to be a quotes bulky fibroid uterus with 2 dominant leiomyomas the patient subsequently underwent a pelvic sonogram on November 1st 2016 where there were 4 leiomyomata detailed the patient then underwent a MRI on September 5th 2019 which revealed minimal tendinosis at the right hamstring insertion site on the ischial tuberosity.  The myomatous uterus had increasing size of uterine fibroids the largest being 4.0 x 3.9 x 3.7 cm previously having been 1.8 cm.  Is given this history that the patient is seen in consultation.    Patient did undergo a diagnostic hysteroscopy dilatation curettage NovaSure endometrial ablation MiniArc sling and cystoscopy on November 19, 2013    Patient had been on Combi patch since February of 2015 which was stopped 1 week ago.  The patient relates no postmenopausal bleeding abnormal vaginal discharge she is having occasional left lower quadrant pain and pain for the left upper quadrant into the left lower quadrant for 1 months duration.    Past History  Current Outpatient Medications   Medication Sig   . Bifidobacterium Infantis (ALIGN) 4 mg Oral Capsule Take by mouth   . buPROPion (WELLBUTRIN XL) 150 mg extended release 24 hr tablet    . cholecalciferol, vitamin D3, 1,000 unit (25 mcg) Oral Tablet Take 1,000 Units by mouth Once a day   . clonazePAM (KLONOPIN) 0.5 mg Oral Tablet Take 0.5 mg by mouth Once per day as needed for Other (anxiety)   . DULoxetine (CYMBALTA DR) 60 mg Oral  Capsule, Delayed Release(E.C.) Take 20 mg by mouth Once a day    . esomeprazole magnesium (NEXIUM) 40 mg Oral Capsule, Delayed Release(E.C.) Take 40 mg by mouth Every morning before breakfast   . fluticasone propionate (FLONASE) 50 mcg/actuation Nasal Spray, Suspension 1 Spray by Each Nostril route Once a day   . HYDROcodone-acetaminophen (NORCO) 5-325 mg Oral Tablet Take 1-2 Tabs by mouth Every 4 hours as needed for Pain (Patient not taking: Reported on 07/06/2018)   . Lactobacillus acidophilus (PROBIOTIC) 10 billion cell Oral Capsule Take by mouth   . Magnesium Oxide 250 mg magnesium Oral Tablet Take 400 mg by mouth   . montelukast (SINGULAIR) 10 mg Oral Tablet Take 10 mg by mouth Every evening   . trimethoprim-sulfamethoxazole (BACTRIM DS) 800-160 mg Oral Tablet Take 1 Tab (160 mg total) by mouth Every 12 hours (Patient not taking: Reported on 07/06/2018)     No Known Allergies  Past Medical History:   Diagnosis Date   . Anesthesia complication     hypotension with epidural; crying s/p abdominoplasty   . Anxiety    . Arthritis     neck and hands   . Asthma     teenager; most recent occurance age 40   . Back problem     sciatica   . C. difficile diarrhea 10/2013    taking flagyl to treat   . Depression    . GERD (gastroesophageal reflux  disease)    . Hiatal hernia    . Positive PPD     tests positive, but negative chest xrays   . Sleep apnea     uses BiPAP occasionally   . Wears glasses          Past Surgical History:   Procedure Laterality Date   . CYSTOSCOPY  11/19/2013   . DILATION AND CURETTAGE, DIAGNOSTIC / THERAPEUTIC  11/19/2013   . HX ABDOMINOPLASTY      also a necklift   . HX EYE SURGERY Left     s/p injury   . HX LAPAROTOMY      ectopic pregnancy; removal of fallopian tube   . HX SEPTOPLASTY     . HX UPPER ENDOSCOPY  10/29/13    x 3   . HYSTEROSCOPY W/ ENDOMETRIAL ABLATION  11/19/2013   . INCONTINENCE SURGERY  11/19/2013         Family Medical History:     None            Social History     Socioeconomic  History   . Marital status: Married     Spouse name: Not on file   . Number of children: Not on file   . Years of education: Not on file   . Highest education level: Not on file   Tobacco Use   . Smoking status: Current Every Day Smoker     Packs/day: 0.75     Types: Cigarettes   . Smokeless tobacco: Never Used   . Tobacco comment: using vapor cigarette as well   Substance and Sexual Activity   . Alcohol use: Yes     Comment: occassional   . Drug use: No   Other Topics Concern   . Ability to Walk 2 Flight of Steps without SOB/CP Yes   . Ability To Do Own ADL's Yes     There is no problem list on file for this patient.    OB History    None         Review of System:  Nursing Notes:   Mysliwiec, Sherolyn Buba, RN  07/06/18 1158  Signed     07/06/18 1100   OTHER   Constitutional: positive for       + fatigue;sweats   Eyes: positive for       + contacts/glasses   Ears, nose, mouth, and throat: positive for       + nasal congestion   Respiratory: positive for       +   (apnea)   Cardiovascular: negative   Gastrointestinal: positive for       + abdominal pain;heartburn;constipation;diarrhea   Genitourinary: positive for       + urinary incontinence   Integument/breast: negative   Hematologic/lymphatic: negative   Musculoskeletal: positive for       + neck pain;low back pain   Neurological: negative   Behavioral/Psych: positive for       + anxiety;depression   Endocrine: negative   Allergic/Immunologic: negative     I have reviewed and agree with review of systems as documented by the clinical staff.    Exam:  BP 109/79   Pulse 90   Temp 36.7 C (98.1 F) (Oral)   Ht 1.645 m (5' 4.75")   Wt 81 kg (178 lb 8 oz)   BMI 29.93 kg/m       The examination of the head, eyes, ears, nose, and throat revealed there to be no  abnormalities noted. The thyroid examination revealed there to be a normal sized thyroid gland without nodularity or tenderness.  The breast examination reveals no abnormal contour to the breasts or nipples,  no nipple discharge noted, no skin changes revealed the supraclavicular, infraclavicular and axillary nodal areas are negative.  The abdominal examination reveals the abdomen to be soft, nontender without organomegaly.  The inguinal femoral areas revealed no lymphadenopathy or tenderness.  There is an abdominoplasty incision in the lower abdomen with severe scarring.  The pelvic examination revealed normal female external genitalia the vaginal introitus and urethral meatus without lesions the cervix is clear the bimanual revealed uterus to be enlarged irregular compatible with a [redacted] week gestation.  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 The patient at this point appears to be considering surgery and will be seeing you for further consultation    Emiliano Dyer MD, [place] 07/06/2018 13:00       Portions of this note may be dictated using voice recognition software or a dictation service. Variances in spelling and vocabulary are possible and unintentional. Not all errors are caught/corrected. Please notify the [facility] if any discrepancies are noted or if the meaning of any statement is not clear.

## 2018-07-06 NOTE — Nursing Note (Signed)
07/06/18 1100   OTHER   Constitutional: positive for       + fatigue;sweats   Eyes: positive for       + contacts/glasses   Ears, nose, mouth, and throat: positive for       + nasal congestion   Respiratory: positive for       +   (apnea)   Cardiovascular: negative   Gastrointestinal: positive for       + abdominal pain;heartburn;constipation;diarrhea   Genitourinary: positive for       + urinary incontinence   Integument/breast: negative   Hematologic/lymphatic: negative   Musculoskeletal: positive for       + neck pain;low back pain   Neurological: negative   Behavioral/Psych: positive for       + anxiety;depression   Endocrine: negative   Allergic/Immunologic: negative

## 2018-07-13 ENCOUNTER — Other Ambulatory Visit: Payer: Self-pay

## 2018-08-04 ENCOUNTER — Emergency Department
Admission: EM | Admit: 2018-08-04 | Discharge: 2018-08-04 | Disposition: A | Discharge status: Home / Self Care | Attending: Emergency Medicine | Admitting: Emergency Medicine

## 2018-08-04 ENCOUNTER — Emergency Department

## 2018-08-04 DIAGNOSIS — F17210 Nicotine dependence, cigarettes, uncomplicated: Secondary | ICD-10-CM | POA: Insufficient documentation

## 2018-08-04 DIAGNOSIS — D259 Leiomyoma of uterus, unspecified: Secondary | ICD-10-CM | POA: Insufficient documentation

## 2018-08-04 DIAGNOSIS — K219 Gastro-esophageal reflux disease without esophagitis: Secondary | ICD-10-CM | POA: Insufficient documentation

## 2018-08-04 DIAGNOSIS — R197 Diarrhea, unspecified: Secondary | ICD-10-CM | POA: Insufficient documentation

## 2018-08-04 DIAGNOSIS — K5730 Diverticulosis of large intestine without perforation or abscess without bleeding: Secondary | ICD-10-CM | POA: Insufficient documentation

## 2018-08-04 LAB — COMPREHENSIVE METABOLIC PANEL
ALT: 19 U/L (ref 0–55)
AST (SGOT): 15 U/L (ref 5–34)
Albumin/Globulin Ratio: 1.4 (ref 0.9–2.2)
Albumin: 3.6 g/dL (ref 3.5–5.0)
Alkaline Phosphatase: 74 U/L (ref 37–106)
Anion Gap: 6 (ref 5.0–15.0)
BUN: 14 mg/dL (ref 7.0–19.0)
Bilirubin, Total: 0.3 mg/dL (ref 0.2–1.2)
CO2: 25 mEq/L (ref 22–29)
Calcium: 9 mg/dL (ref 8.5–10.5)
Chloride: 109 mEq/L (ref 100–111)
Creatinine: 0.9 mg/dL (ref 0.6–1.0)
Globulin: 2.5 g/dL (ref 2.0–3.6)
Glucose: 94 mg/dL (ref 70–100)
Potassium: 4 mEq/L (ref 3.5–5.1)
Protein, Total: 6.1 g/dL (ref 6.0–8.3)
Sodium: 140 mEq/L (ref 136–145)

## 2018-08-04 LAB — URINALYSIS REFLEX TO MICROSCOPIC EXAM - REFLEX TO CULTURE
Bilirubin, UA: NEGATIVE
Glucose, UA: NEGATIVE
Ketones UA: NEGATIVE
Leukocyte Esterase, UA: NEGATIVE
Nitrite, UA: NEGATIVE
Protein, UR: NEGATIVE
RBC, UA: 0 /hpf (ref 0–5)
Specific Gravity UA: 1.005 (ref 1.001–1.035)
Urine pH: 7 (ref 5.0–8.0)
Urobilinogen, UA: 0.2 mg/dL (ref 0.2–2.0)
WBC, UA: NONE SEEN /hpf (ref 0–5)

## 2018-08-04 LAB — CBC AND DIFFERENTIAL
Absolute NRBC: 0 10*3/uL (ref 0.00–0.00)
Basophils Absolute Automated: 0.04 10*3/uL (ref 0.00–0.08)
Basophils Automated: 0.7 %
Eosinophils Absolute Automated: 0.18 10*3/uL (ref 0.00–0.44)
Eosinophils Automated: 3.2 %
Hematocrit: 37.7 % (ref 34.7–43.7)
Hgb: 12.9 g/dL (ref 11.4–14.8)
Immature Granulocytes Absolute: 0.01 10*3/uL (ref 0.00–0.07)
Immature Granulocytes: 0.2 %
Lymphocytes Absolute Automated: 1.65 10*3/uL (ref 0.42–3.22)
Lymphocytes Automated: 29.4 %
MCH: 31.9 pg (ref 25.1–33.5)
MCHC: 34.2 g/dL (ref 31.5–35.8)
MCV: 93.3 fL (ref 78.0–96.0)
MPV: 9.8 fL (ref 8.9–12.5)
Monocytes Absolute Automated: 0.51 10*3/uL (ref 0.21–0.85)
Monocytes: 9.1 %
Neutrophils Absolute: 3.22 10*3/uL (ref 1.10–6.33)
Neutrophils: 57.4 %
Nucleated RBC: 0 /100 WBC (ref 0.0–0.0)
Platelets: 219 10*3/uL (ref 142–346)
RBC: 4.04 10*6/uL (ref 3.90–5.10)
RDW: 13 % (ref 11–15)
WBC: 5.61 10*3/uL (ref 3.10–9.50)

## 2018-08-04 LAB — LIPASE: Lipase: 13 U/L (ref 8–78)

## 2018-08-04 LAB — GFR: EGFR: >60

## 2018-08-04 MED ORDER — IOHEXOL 240 MG/ML IJ SOLN
50.00 mL | Freq: Once | INTRAMUSCULAR | Status: AC
Start: 2018-08-04 — End: 2018-08-04
  Administered 2018-08-04: 11:00:00 50 mL via ORAL

## 2018-08-04 MED ORDER — SODIUM CHLORIDE 0.9 % IV BOLUS
1000.00 mL | Freq: Once | INTRAVENOUS | Status: AC
Start: 2018-08-04 — End: 2018-08-04
  Administered 2018-08-04: 11:00:00 1000 mL via INTRAVENOUS

## 2018-08-04 MED ORDER — IOHEXOL 350 MG/ML IV SOLN
100.00 mL | Freq: Once | INTRAVENOUS | Status: AC | PRN
Start: 2018-08-04 — End: 2018-08-04
  Administered 2018-08-04: 13:00:00 100 mL via INTRAVENOUS

## 2018-08-04 NOTE — ED Triage Notes (Signed)
Pt reports about one month hx of abdominal pain and diarrhea. Is scheduled for hysterectomy Dec. 4 for enlarged fibroids vs cancerous growth. Denies N/V but states appetite is low. Had colonoscopy 5 years ago with polypectomy and noted diverticulosis.

## 2018-08-04 NOTE — ED Provider Notes (Signed)
Physician/Midlevel provider first contact with patient: 08/04/18 1030         History     Chief Complaint   Patient presents with   . Abdominal Pain   . Diarrhea     This 57 year old female presents with a one-month history of loose yellowish stools.  Patient states it started with severe constipation and then the diarrhea.  She has crampy bloating type pain along the course of the colon throughout the abdomen.  She denies any fever, chills, nausea or vomiting.  She denies any back pain.  No numbness or tingling.  Patient has known large fibroids and is scheduled for hysterectomy in December.  One of the fibroids they are concerned about being cancerous.  She currently takes hormone therapy for menopause.  No recent travel or trauma.  No other sick contacts.  Patient has had a previous colonoscopy with removal of some polyps.  Pain is moderate, waxes and wanes.      Nothing makes this better and palpation makes it worse.          Abdominal Pain   Associated symptoms: diarrhea    Associated symptoms: no chest pain, no constipation, no cough, no dysuria, no fever, no nausea, no shortness of breath, no sore throat and no vomiting    Diarrhea   Associated symptoms: abdominal pain    Associated symptoms: no diaphoresis, no fever, no headaches and no vomiting             Past Medical History:   Diagnosis Date   . Anxiety    . Arthritis     Neck Right Hand   . Asthma without status asthmaticus    . Claustrophobia    . Depression    . Gastroesophageal reflux disease    . Headache(784.0)     Sinus   . Irritable bowel syndrome    . Low back pain    . MTHFR (methylene THF reductase) deficiency and homocystinuria    . Other plastic surgery for unacceptable cosmetic appearance    . Sciatica of left side    . Seasonal allergies    . Sleep apnea    . Smokes with greater than 40 pack year history     Quit 2 weks ago.       Past Surgical History:   Procedure Laterality Date   . ABDOMINOPLASTY, LIPOSUCTION (COSMETIC)  2009   .  CYSTOSCOPY, URETEROSCOPY  2015   . EGD, COLONOSCOPY  2015   . FACELIFT, (RHYTIDECTOMY) (COSMETIC)  02/06/2014    Procedure: FACELIFT, (RHYTIDECTOMY) (COSMETIC);  Surgeon: Katheren Puller, MD;  Location: [facility] OR;  Service: Plastics;  Laterality: Bilateral;  RHYTIDECTOMY (COSMETIC)    . HYSTEROSCOPY, ENDOMETRIAL ABLATION, THERMACHOICE  2015   . LIPOSUCTION, NECK (COSMETIC)  2009       Family History   Problem Relation Age of Onset   . Heart disease Mother    . Heart disease Father    . Cancer Maternal Grandfather    . Anesthesia problems Sister        Social  Social History     Tobacco Use   . Smoking status: Current Every Day Smoker     Packs/day: 0.00     Years: 40.00     Pack years: 0.00     Types: Cigarettes   . Smokeless tobacco: Never Used   Substance Use Topics   . Alcohol use: No     Alcohol/week: 0.0 standard drinks   .  Drug use: No       .     No Known Allergies    Home Medications     Med List Status:  In Progress Set By: Monte Fantasia, RN at 08/04/2018 10:27 AM        Status Comment        08/04/2018 10:27 AM    FolaPro                Cholecalciferol (VITAMIN D) 400 units/mL liquid (NICU)     Take by mouth daily     esomeprazole (NEXIUM) 40 MG capsule     Take 40 mg by mouth every morning before breakfast.     estradiol-norethindrone (COMBIPATCH) 0.05-0.14 MG/DAY          Hormone Cream Base Cream     by Does not apply route.Hormone patch     Magnesium 300 MG Cap     Take by mouth     Probiotic Product (PROBIOTIC DAILY PO)     Take 2 tablets by mouth daily.           Flagged for Removal             buPROPion XL (WELLBUTRIN XL) 150 MG 24 hr tablet     TAKE ONE TABLET BY MOUTH EVERY DAY     DULoxetine (CYMBALTA) 30 MG capsule     Take 20 mg by mouth daily        ibuprofen (ADVIL,MOTRIN) 200 MG tablet     Take 600 mg by mouth every 6 (six) hours as needed for Pain           Review of Systems   Constitutional: Negative for diaphoresis and fever.   HENT: Negative for congestion, ear pain  and sore throat.    Eyes: Negative for pain and visual disturbance.   Respiratory: Negative for cough, chest tightness, shortness of breath and wheezing.    Cardiovascular: Negative for chest pain, palpitations and leg swelling.   Gastrointestinal: Positive for abdominal pain and diarrhea. Negative for constipation, nausea and vomiting.   Genitourinary: Negative for dysuria and urgency.   Musculoskeletal: Negative for back pain, gait problem and neck pain.   Skin: Negative for color change and rash.   Neurological: Negative for dizziness, weakness and headaches.   Psychiatric/Behavioral: Negative for confusion. The patient is not nervous/anxious.        Physical Exam    BP: 119/83, Heart Rate: 87, Temp: 97.5 F (36.4 C), Resp Rate: 16, SpO2: 98 %, Weight: 82.5 kg    Physical Exam  Vitals signs and nursing note reviewed.   Constitutional:       Appearance: She is well-developed. She is not diaphoretic.   HENT:      Head: Normocephalic and atraumatic.      Right Ear: External ear normal.      Left Ear: External ear normal.      Mouth/Throat:      Pharynx: No oropharyngeal exudate.   Eyes:      General: No scleral icterus.        Right eye: No discharge.         Left eye: No discharge.      Conjunctiva/sclera: Conjunctivae normal.      Pupils: Pupils are equal, round, and reactive to light.   Neck:      Musculoskeletal: Normal range of motion and neck supple.      Vascular: No JVD.  Trachea: No tracheal deviation.   Cardiovascular:      Rate and Rhythm: Normal rate and regular rhythm.      Heart sounds: Normal heart sounds. No murmur.   Pulmonary:      Effort: Pulmonary effort is normal.      Breath sounds: Normal breath sounds. No wheezing.   Chest:      Chest wall: No tenderness.   Abdominal:      General: Bowel sounds are normal. There is no distension.      Palpations: Abdomen is soft.      Tenderness: There is tenderness (Along the course of the colon). There is no guarding or rebound.   Musculoskeletal:          General: No tenderness.   Skin:     General: Skin is warm and dry.      Coloration: Skin is not pale.   Neurological:      Mental Status: She is alert and oriented to person, place, and time.      GCS: GCS eye subscore is 4. GCS verbal subscore is 5. GCS motor subscore is 6.      Coordination: Coordination normal.   Psychiatric:         Behavior: Behavior normal.         Judgment: Judgment normal.           MDM and ED Course     ED Medication Orders (From admission, onward)    Start Ordered     Status Ordering Provider    08/04/18 1248 08/04/18 1248  iohexol (OMNIPAQUE) 350 MG/ML injection 100 mL  IMG once as needed     Route: Intravenous  Ordered Dose: 100 mL     Last MAR action:  Imaging Agent Given Darcey Cardy    08/04/18 1048 08/04/18 1047  sodium chloride 0.9 % bolus 1,000 mL  Once     Route: Intravenous  Ordered Dose: 1,000 mL     Last MAR action:  Stopped Velena Keegan    08/04/18 1048 08/04/18 1047  iohexol (OMNIPAQUE) 240 MG/ML solution 50 mL  Once     Route: Oral  Ordered Dose: 50 mL     Last MAR action:  Imaging Agent Given Kateri Balch             MDM  Number of Diagnoses or Management Options  Diarrhea, unspecified type:   Uterine leiomyoma, unspecified location:   Diagnosis management comments: Diagnosis management comments: Oxygen saturation by pulse oximetry is 95%-100%, Normal.  Interventions: None Needed.    Diff OZ:HYQMVHQIONG, colitis, diverticulitis, electrolyte disorder, dehydration.    Reexamination: Patient feeling better.  No diarrhea.  CT scan negative.    Patient is alert and oriented 3.  On reexamination, vital signs were stable, and patient is in no acute distress.  At this point she is stable for discharge.  Graciela L Feild had an opportunity to ask questions and she verbalized understanding of instructions.      I have reviewed the nursing history.    DR. Melvern Sample  is the primary attending for this patient and has obtained and performed the history, PE, and medical decision  making for this patient.      Results     Procedure Component Value Units Date/Time    Urinalysis Reflex to Microscopic Exam- Reflex to Culture ([id number])    (Abnormal) Collected:  08/04/18 1225     Updated:  08/04/18 1300     Urine Type  Urine, Clean Ca     Color, UA YELLOW     Clarity, UA CLEAR     Specific Gravity UA 1.005     Urine pH 7.0     Leukocyte Esterase, UA NEGATIVE     Nitrite, UA NEGATIVE     Protein, UR NEGATIVE     Glucose, UA NEGATIVE     Ketones UA NEGATIVE     Urobilinogen, UA 0.2 mg/dL      Bilirubin, UA NEGATIVE     Blood, UA TRACE-INTACT     RBC, UA 0 -2 /hpf      WBC, UA None Seen /hpf      Squamous Epithelial Cells, Urine 0 - 5 /hpf     Narrative:       Replace urinary catheter prior to obtaining the urine culture  if it has been in place for greater than or equal to 14  days:->N/A No Foley  Indications for U/A Reflex to Micro - Reflex to  Culture:->Suprapubic Pain/Tenderness or Dysuria  Rescheduled by [id number] at 08/04/2018 12:26 Reason: [id number]   [id number]    Lipase ([id number]) Collected:  08/04/18 1059    Specimen:  Blood Updated:  08/04/18 1128     Lipase 13 U/L     GFR ([id number]) Collected:  08/04/18 1059     Updated:  08/04/18 1128     EGFR >60.0    Comprehensive metabolic panel ([id number]) Collected:  08/04/18 1059    Specimen:  Blood Updated:  08/04/18 1128     Glucose 94 mg/dL      BUN 10.2 mg/dL      Creatinine 0.9 mg/dL      Sodium 725 mEq/L      Potassium 4.0 mEq/L      Chloride 109 mEq/L      CO2 25 mEq/L      Calcium 9.0 mg/dL      Protein, Total 6.1 g/dL      Albumin 3.6 g/dL      AST (SGOT) 15 U/L      ALT 19 U/L      Alkaline Phosphatase 74 U/L      Bilirubin, Total 0.3 mg/dL      Globulin 2.5 g/dL      Albumin/Globulin Ratio 1.4     Anion Gap 6.0    CBC with differential ([id number]) Collected:  08/04/18 1059    Specimen:  Blood Updated:  08/04/18 1105     WBC 5.61 x10 3/uL      Hgb 12.9 g/dL      Hematocrit 42.5 %      Platelets 219 x10 3/uL      RBC 4.04 x10 6/uL       MCV 93.3 fL      MCH 31.9 pg      MCHC 34.2 g/dL      RDW 13 %      MPV 9.8 fL      Neutrophils 57.4 %      Lymphocytes Automated 29.4 %      Monocytes 9.1 %      Eosinophils Automated 3.2 %      Basophils Automated 0.7 %      Immature Granulocyte 0.2 %      Nucleated RBC 0.0 /100 WBC      Neutrophils Absolute 3.22 x10 3/uL      Abs Lymph Automated 1.65 x10 3/uL      Abs Mono Automated 0.51 x10 3/uL  Abs Eos Automated 0.18 x10 3/uL      Absolute Baso Automated 0.04 x10 3/uL      Absolute Immature Granulocyte 0.01 x10 3/uL      Absolute NRBC 0.00 x10 3/uL         Radiology Results (24 Hour)     Procedure Component Value Units Date/Time    CT Abd/Pelvis with IV and PO Contrast ([id number]) Collected:  08/04/18   1253    Order Status:  Completed Updated:  08/04/18 1306    Narrative:       HISTORY: Abdominal pain    TECHNIQUE: CT abdomen and pelvis following IV contrast administration.  Oral contrast also utilized. Omnipaque 350 100 cc IV given. The  following dose reduction techniques were utilized: automated exposure  control and/or adjustment of the mA and/or kV according to patient size,  and/or the use iterative reconstruction technique.    COMPARISON: CT abdomen pelvis, 09/11/2016, 08/12/2015.      FINDINGS:   Lung bases: Normal, where seen.    Liver: Inferior right hepatic lobe cyst, 1.6 x 1.4 cm, (3:59),  previously 1.4 x 1.1 cm. No other focal liver lesion.    Gallbladder / biliary tree: Normal.    Pancreas: Normal.    Spleen: Normal.    Adrenal glands: Left adrenal nodule, 1.2 x 1.0 cm, (3:46), unchanged  from 2016.    Kidneys / ureters: Normal.    Gastrointestinal: Small duodenal diverticulum. Additional small  diverticula of the proximal sigmoid colon. No bowel obstruction or area  suspicious for focal bowel inflammation. Normal appendix, (3:88).    Abdominal vessels: Scattered atheromatous calcifications of the  abdominal aorta and its branches. No abdominal aortic aneurysm.    Lymph nodes: Normal.     Peritoneum / retroperitoneum: Normal.    Pelvic organs: Normal bladder. Lobular uterus with multiple  heterogeneously enhancing fibroids.    Musculoskeletal: Normal.        Impression:          1.  No acute abnormality identified within the abdomen or pelvis.  2.  Distal colonic diverticulosis without associated inflammation.  3.  Left adrenal 1.2 cm nodule, unchanged from 2016 and remains  consistent with adenoma.  4.  Uterine fibroids.    Mosetta Putt, MD   08/04/2018 1:02 PM        *This note was generated by the Epic EMR system/ Dragon speech recognition and may contain inherent errors or omissions not intended by the user. Grammatical errors, random word insertions, deletions, pronoun errors and incomplete sentences are occasional consequences of this technology due to software limitations. Not all errors are caught or corrected. If there are questions or concerns about the content of this note or information contained within the body of this dictation they should be addressed directly with the author for clarification           Amount and/or Complexity of Data Reviewed  Clinical lab tests: ordered and reviewed  Tests in the radiology section of CPT: ordered and reviewed  Tests in the medicine section of CPT: ordered and reviewed        Procedures    Clinical Impression & Disposition     Clinical Impression  Final diagnoses:   Diarrhea, unspecified type   Uterine leiomyoma, unspecified location        ED Disposition     ED Disposition Condition Date/Time Comment    Discharge  Sat Aug 04, 2018  1:42 PM [facility]  discharge to home/self care.    Condition at disposition: Stable           Discharge Medication List as of 08/04/2018  1:42 PM                    Melvern Sample, DO  08/04/18 1843

## 2018-08-04 NOTE — Discharge Instructions (Signed)
Diarrhea    You have been diagnosed with diarrhea.    You have diarrhea when you have stools (bowel movements) that are soft or liquid, or when you have too many stools in a day. You may also have stomach cramps/ pains, nausea (feeling sick to your stomach), vomiting and fever (temperature higher than 100.4F / 38C).    Diarrhea can be caused by bacteria, viruses and parasites. People sometimes get "traveler s diarrhea." They get it when they go to other countries. It is caused by bacteria.    People with diarrhea often lose a lot of body fluids. This causes dehydration. Drink a lot of water or other fluids to stay hydrated. You may also be given medicine for your diarrhea. It will slow the diarrhea and stop the vomiting (if you have this symptom).    You should drink lots of natural juices or a sports-type drink that has electrolytes (sodium, potassium, etc). Do not drink a lot of plain water. Sugary drinks like apple and pear juice might make the diarrhea worse.    You might be given medicine to help you with your diarrhea, nausea (feeling sick), vomiting and stomach cramps. This will depend on your age, medical history and symptoms.    It is OK for you to go home today.    Good hygiene (keeping clean) helps to keep the problem from spreading. Please wash your hands often, using soap and water, especially after using the bathroom. Do not prepare food or share food, drinks or utensils (forks, knives, etc.) with other people.    It is very important that you follow up with your regular doctor or a specialist. The doctor will tell you how soon this follow-up needs to be.    YOU SHOULD SEEK MEDICAL ATTENTION IMMEDIATELY, EITHER HERE OR AT THE NEAREST EMERGENCY DEPARTMENT, IF ANY OF THE FOLLOWING OCCURS:   You are vomiting and cannot keep down fluids.   There is blood, pus or mucous in your stool.   You have symptoms of dehydration. These include dry mouth, not urinating (peeing) at least once every eight  hours, feeling dizzy/lightheaded, severe weakness or passing out.              Dehydration    You have been diagnosed with dehydration.    Dehydration is when your body is low on fluids (liquids). Dehydration has a variety of causes. These range from vomiting and diarrhea, to excessive (a lot of) sweating and poor appetite.    You have received intravenous (IV) fluids. These are to help fix your dehydration. It is important to keep hydrating yourself at home. Drink plenty of fluids frequently. Drink fluids that won t upset your stomach. This includes water and juice. It also includes drinks like Gatorade. Stay away from beverages like soda pop and tea. They may make you worse. Avoid caffeine and alcohol. They may cause you to lose more fluid.    YOU SHOULD SEEK MEDICAL ATTENTION IMMEDIATELY, EITHER HERE OR AT THE NEAREST EMERGENCY DEPARTMENT, IF ANY OF THE FOLLOWING OCCURS:   Fever (temperature higher than 100.4F / 38C) or shaking chills.   Constant vomiting and/or diarrhea.   Lightheadedness or fainting.   If you do not urinate (pee) for 8 or more hours.

## 2018-08-28 ENCOUNTER — Ambulatory Visit (HOSPITAL_BASED_OUTPATIENT_CLINIC_OR_DEPARTMENT_OTHER)
Admission: RE | Admit: 2018-08-28 | Discharge: 2018-08-28 | Disposition: A | Discharge status: Home / Self Care | Source: Ambulatory Visit | Attending: Gynecology | Admitting: Gynecology

## 2018-08-28 ENCOUNTER — Encounter (HOSPITAL_BASED_OUTPATIENT_CLINIC_OR_DEPARTMENT_OTHER): Payer: Self-pay

## 2018-08-28 DIAGNOSIS — D259 Leiomyoma of uterus, unspecified: Secondary | ICD-10-CM | POA: Insufficient documentation

## 2018-08-28 DIAGNOSIS — Z01812 Encounter for preprocedural laboratory examination: Secondary | ICD-10-CM | POA: Insufficient documentation

## 2018-08-28 HISTORY — DX: Acute upper respiratory infection, unspecified: J06.9

## 2018-08-28 HISTORY — DX: Other general symptoms and signs: R68.89

## 2018-08-28 HISTORY — DX: Polyp of colon: K63.5

## 2018-08-28 HISTORY — DX: Irritable bowel syndrome without diarrhea: K58.9

## 2018-08-28 HISTORY — DX: Tinnitus, unspecified ear: H93.19

## 2018-08-28 LAB — CBC WITH DIFF
BASOPHIL #: 0.1 x10ˆ3/uL (ref 0.00–0.10)
BASOPHIL %: 1 % (ref 0–3)
EOSINOPHIL #: 0.2 x10ˆ3/uL (ref 0.00–0.50)
EOSINOPHIL %: 3 % (ref 0–5)
HCT: 42.3 % (ref 36.0–45.0)
HCT: 42.3 % (ref 36.0–45.0)
HGB: 14.5 g/dL (ref 12.0–15.5)
LYMPHOCYTE #: 1.9 x10ˆ3/uL (ref 1.00–4.80)
LYMPHOCYTE %: 30 % (ref 15–43)
MCH: 32.6 pg (ref 27.5–33.2)
MCHC: 34.2 g/dL (ref 32.0–36.0)
MCV: 95.4 fL (ref 82.0–97.0)
MONOCYTE #: 0.5 x10?3/uL (ref 0.20–0.90)
MONOCYTE #: 0.5 x10?3/uL (ref 0.20–0.90)
MONOCYTE %: 8 % (ref 5–12)
MPV: 8.6 fL (ref 7.4–10.5)
NEUTROPHIL #: 3.7 10*3/uL (ref 1.50–6.50)
NEUTROPHIL #: 3.7 x10ˆ3/uL (ref 1.50–6.50)
NEUTROPHIL %: 59 % (ref 43–76)
PLATELETS: 235 x10ˆ3/uL (ref 150–450)
RBC: 4.43 x10ˆ6/uL (ref 4.00–5.10)
RDW: 13.6 % (ref 11.0–16.0)
WBC: 6.3 x10ˆ3/uL (ref 4.0–11.0)

## 2018-08-28 LAB — URINALYSIS, MACROSCOPIC
BILIRUBIN: NEGATIVE mg/dL
BLOOD: NEGATIVE mg/dL
GLUCOSE: NEGATIVE mg/dL
KETONES: NEGATIVE mg/dL
LEUKOCYTES: NEGATIVE WBCs/uL
NITRITE: NEGATIVE
PH: 6.5 (ref <8.0)
PROTEIN: NEGATIVE mg/dL
SPECIFIC GRAVITY: 1.004 (ref <1.022)
UROBILINOGEN: <2 mg/dL (ref <=2.0)

## 2018-08-28 LAB — COMPREHENSIVE METABOLIC PROFILE - BMC/JMC ONLY
ALBUMIN/GLOBULIN RATIO: 1.6 (ref 0.8–2.0)
ALBUMIN: 4 g/dL (ref 3.5–5.0)
ALKALINE PHOSPHATASE: 59 U/L (ref 38–126)
ALKALINE PHOSPHATASE: 59 U/L (ref 38–126)
ALT (SGPT): 19 U/L (ref 14–54)
ANION GAP: 9 mmol/L (ref 3–11)
AST (SGOT): 22 U/L (ref 15–41)
BILIRUBIN TOTAL: 0.4 mg/dL (ref 0.3–1.2)
BUN/CREA RATIO: 10 (ref 6–22)
BUN: 9 mg/dL (ref 6–20)
CALCIUM: 9.3 mg/dL (ref 8.6–10.3)
CHLORIDE: 104 mmol/L (ref 101–111)
CO2 TOTAL: 27 mmol/L (ref 22–32)
CREATININE: 0.93 mg/dL (ref 0.44–1.00)
ESTIMATED GFR: >60 mL/min/1.73mˆ2 (ref >60)
GLUCOSE: 75 mg/dL (ref 70–110)
POTASSIUM: 3.9 mmol/L (ref 3.4–5.1)
PROTEIN TOTAL: 6.5 g/dL (ref 6.4–8.3)
SODIUM: 140 mmol/L (ref 136–145)

## 2018-08-28 LAB — FOLATE: FOLATE: >23.9 ng/mL (ref >=4.5)

## 2018-08-28 LAB — TYPE AND SCREEN
ABO/RH(D): O POS
ANTIBODY SCREEN: NEGATIVE

## 2018-08-28 NOTE — Discharge Instructions (Signed)
Please contact your provider if you are experiencing cold or flu-like symptoms and/or any signs of infection prior to your procedure. Nothing to eat or drink after midnight the day of surgery except a sip of water with medications marked.

## 2018-08-29 LAB — HOMOCYSTEINE, TOTAL, PLASMA
HOMOCYSTEINE: 4.24 umol/L (ref 3.00–11.00)
HOMOCYSTEINE: 4.24 umol/L (ref 3.00–11.00)

## 2018-09-10 MED ORDER — SODIUM CHLORIDE 0.9 % INTRAVENOUS SOLUTION
2.00 g | Freq: Once | INTRAVENOUS | Status: AC
Start: 2018-09-12 — End: 2018-09-12
  Administered 2018-09-12: 2 g via INTRAVENOUS
  Filled 2018-09-10: qty 20

## 2018-09-11 NOTE — H&P (Signed)
HPI:  *Signs & symptoms: enlarging uterine fibroids after menopause  *Context: has known uterine fibroids, 1st appeared on [place] in 10/2014, MRI at that time (11/2014) showed overall uterine size to be 7.6 X 4.6 X 5.2.  with a posterior IM/SS fibroid measuring 2.5 X 2.6 X 2.8 cm and a right anterior IM/SS fibroid measuring 1.7 X 1.8 cm.  Ultrasound in 08/2015 showed overall uterine size to be 7.5 X 4.5 X 6.0 cm with 4 fibroids noted, anterior IM measuring 1.7 X 1.5 cm, fundal IM measuring 1.4 X 1.6 cm, posterior IM measuring 3.4 X 2.8 cm, and posterior im measuring 1.4 X 1.7 cm.  More recently she had another MRI to assess her chronic ischial pain which incidentally showed overall uterine size to be 8.2 X 7.2 X 7.2 cm with multiple fibroids, including an IM measuring 4.0 X 3.9 X 3.7 which was felt to be larger than previously measured (1.8 cm).  There were no reported findings of any suspicious appearance to the fibroids, no lymphadenopathy.   *Associated Symptoms: patient has had no vaginal bleeding or abnormal discharge.  She has occasional pelvic discomfort.    *Modifying factors: she has been on combination HRT since 2015.    Review Of Systems:  Constitutional Negative (no current or acute fevers, chills, dizziness or  weight changes).  Eyes Negative.  ENT Negative.  Cardiovascular Negative.  Respiratory Negative.  Gastrointestinal Negative (no diarrhea or melena).  Genitourinary Negative (no dysuria or hematuria).  Musculoskeletal Negative.  Integumentary Negative.  Neurological Negative.  Psychiatric Negative (no current depressive symptoms or anxiety).  Endocrine Negative (no hot or cold intolerance, tremor, or  polydipsia).  Hematology Negative (no easy bruising, epistaxis, or history of  thromboembolism).  Breast Negative (no masses, pain, skin changes, nipple  discharge, or bleeding).  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 Normal  Paps since.  09/05/17: negative cotest  STD's  POSITIVE FOR: Genital herpes since age 57. Only gets  outbreaks rarely with extreme stress.  OB History G5P2. 2 vaginal deliveries. 1 ectopic. 2 SAb.  Med/Surg History:  Illnesses   Depression. Gastroesophageal reflux disease.  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  Hiatal hernia  Asthma.  Surgeries   Upper endoscopy   Colonoscopy. 2016 ([place], [place]).  Removal of excess skin from abdomen.  Deviated septum.  Exploratory laparotomy with right salpingectomy for  ruptured ectopic.  2015: Hysteroscopy, D&C, Novasure, MiniArc sling  Allergies:  Allergy No known drug allergy.  Social History:  Marital Status Married.  Tobacco Non-smoker. Quit smoking (12/2013).  Alcohol No alcohol issues.  Sexual History Remains sexually active.  Family History:  Pituitary tumor in sister, benign.  Bone cancer in Grandfather.  Coronary artery disease.  MTHFR mutation  General Exam:  General No apparent distress; appears stated age.  Neck/Thyroid Full range of motion, no masses or thyromegaly.  Respiratory Lungs clear to ascultation. No retractions.  Cardiovscular Heart regular rate and rhythm without murmur, gallop, or  rub; 2+ distal pulses noted  Lymphatic No palpable lymphadenopathy.  Skin No obvious rashes or lesions, good turgor.  Neurological Cranial nerves II-XII appear intact, no apparent sensory  or motor deficit.  Psychiatric Alert and oriented X 4. No apparent mood disturbance,  affect seems  normal.  Abdomen Soft, nontender, nondistended; no apparent hernia,  mass, or organomegaly.  Assessments:  Uterine leiomyomata, enlarging after menopause.  Plan:  She has elected for definitive surgical management with Total Laparoscopic Hysterectomy and Bilateral Salpingectomy with cystoscopy.  We have offered bilateral oophorectomy, she declines and wishes to keep her ovaries intact provided they appear grossly  normal. If the uterus is excessively large, will likely use contained laparoscopic morcellation system, though patient understands there is some chance of need for laparotomy.     Zandra Abts, MD

## 2018-09-12 ENCOUNTER — Encounter (HOSPITAL_BASED_OUTPATIENT_CLINIC_OR_DEPARTMENT_OTHER): Payer: Self-pay

## 2018-09-12 ENCOUNTER — Inpatient Hospital Stay (HOSPITAL_BASED_OUTPATIENT_CLINIC_OR_DEPARTMENT_OTHER)
Admission: RE | Admit: 2018-09-12 | Discharge: 2018-09-12 | Disposition: A | Discharge status: Home / Self Care | Source: Ambulatory Visit | Attending: Gynecology | Admitting: Gynecology

## 2018-09-12 ENCOUNTER — Ambulatory Visit (HOSPITAL_BASED_OUTPATIENT_CLINIC_OR_DEPARTMENT_OTHER): Admitting: Anesthesiology

## 2018-09-12 ENCOUNTER — Encounter (HOSPITAL_BASED_OUTPATIENT_CLINIC_OR_DEPARTMENT_OTHER)
Admission: RE | Disposition: A | Discharge status: Home / Self Care | Payer: Self-pay | Source: Ambulatory Visit | Attending: Gynecology

## 2018-09-12 ENCOUNTER — Ambulatory Visit (HOSPITAL_BASED_OUTPATIENT_CLINIC_OR_DEPARTMENT_OTHER)

## 2018-09-12 ENCOUNTER — Ambulatory Visit (HOSPITAL_BASED_OUTPATIENT_CLINIC_OR_DEPARTMENT_OTHER): Admitting: Certified Registered"

## 2018-09-12 ENCOUNTER — Other Ambulatory Visit (HOSPITAL_BASED_OUTPATIENT_CLINIC_OR_DEPARTMENT_OTHER): Payer: Self-pay | Admitting: Gynecology

## 2018-09-12 DIAGNOSIS — F329 Major depressive disorder, single episode, unspecified: Secondary | ICD-10-CM | POA: Insufficient documentation

## 2018-09-12 DIAGNOSIS — Z79899 Other long term (current) drug therapy: Secondary | ICD-10-CM | POA: Insufficient documentation

## 2018-09-12 DIAGNOSIS — D259 Leiomyoma of uterus, unspecified: Secondary | ICD-10-CM

## 2018-09-12 DIAGNOSIS — M1990 Unspecified osteoarthritis, unspecified site: Secondary | ICD-10-CM | POA: Insufficient documentation

## 2018-09-12 DIAGNOSIS — N838 Other noninflammatory disorders of ovary, fallopian tube and broad ligament: Secondary | ICD-10-CM

## 2018-09-12 DIAGNOSIS — D251 Intramural leiomyoma of uterus: Secondary | ICD-10-CM | POA: Insufficient documentation

## 2018-09-12 DIAGNOSIS — Z7951 Long term (current) use of inhaled steroids: Secondary | ICD-10-CM | POA: Insufficient documentation

## 2018-09-12 DIAGNOSIS — N800 Endometriosis of uterus: Secondary | ICD-10-CM

## 2018-09-12 DIAGNOSIS — Z9889 Other specified postprocedural states: Secondary | ICD-10-CM | POA: Insufficient documentation

## 2018-09-12 DIAGNOSIS — F419 Anxiety disorder, unspecified: Secondary | ICD-10-CM | POA: Insufficient documentation

## 2018-09-12 DIAGNOSIS — K219 Gastro-esophageal reflux disease without esophagitis: Secondary | ICD-10-CM | POA: Insufficient documentation

## 2018-09-12 DIAGNOSIS — K449 Diaphragmatic hernia without obstruction or gangrene: Secondary | ICD-10-CM | POA: Insufficient documentation

## 2018-09-12 DIAGNOSIS — D252 Subserosal leiomyoma of uterus: Secondary | ICD-10-CM | POA: Insufficient documentation

## 2018-09-12 LAB — POC FINGERSTICK GLUCOSE - BMC/JMC (RESULTS): GLUCOSE, POC: 74 mg/dl (ref 60–100)

## 2018-09-12 SURGERY — CYSTOSCOPY
Anesthesia: General | Wound class: Clean Contaminated Wounds-The respiratory, GI, Genital, or urinary

## 2018-09-12 MED ORDER — SUGAMMADEX 100 MG/ML INTRAVENOUS SOLUTION
Freq: Once | INTRAVENOUS | Status: DC | PRN
Start: 2018-09-12 — End: 2018-09-12
  Administered 2018-09-12: 100 mg via INTRAVENOUS

## 2018-09-12 MED ORDER — KETAMINE 10 MG/ML INJECTION WRAPPER
Freq: Once | INTRAMUSCULAR | Status: DC | PRN
Start: 2018-09-12 — End: 2018-09-12
  Administered 2018-09-12: 25 mg via INTRAVENOUS

## 2018-09-12 MED ORDER — OXYCODONE-ACETAMINOPHEN 5 MG-325 MG TABLET
1.00 | ORAL_TABLET | Freq: Four times a day (QID) | ORAL | 0 refills | Status: AC | PRN
Start: 2018-09-12 — End: ?

## 2018-09-12 MED ORDER — ACETAMINOPHEN 1,000 MG/100 ML (10 MG/ML) INTRAVENOUS SOLUTION
1000.0000 mg | Freq: Once | INTRAVENOUS | Status: AC | PRN
Start: 2018-09-12 — End: 2018-09-12
  Administered 2018-09-12: 1000 mg via INTRAVENOUS
  Filled 2018-09-12: qty 100

## 2018-09-12 MED ORDER — MEPERIDINE (PF) 50 MG/ML INJECTION SOLUTION
12.5000 mg | INTRAMUSCULAR | Status: DC | PRN
Start: 2018-09-12 — End: 2018-09-12

## 2018-09-12 MED ORDER — LACTATED RINGERS INTRAVENOUS SOLUTION
INTRAVENOUS | Status: DC
Start: 2018-09-12 — End: 2018-09-12
  Administered 2018-09-12: 0 via INTRAVENOUS

## 2018-09-12 MED ORDER — SODIUM CHLORIDE 0.9 % IRRIGATION SOLUTION
1000.00 mL | Freq: Once | Status: DC
Start: 2018-09-12 — End: 2018-09-12

## 2018-09-12 MED ORDER — ONDANSETRON HCL (PF) 4 MG/2 ML INJECTION SOLUTION
4.0000 mg | Freq: Once | INTRAMUSCULAR | Status: DC | PRN
Start: 2018-09-12 — End: 2018-09-12

## 2018-09-12 MED ORDER — MANNITOL 20 % INTRAVENOUS SOLUTION
Freq: Once | INTRAVENOUS | Status: AC
Start: 2018-09-12 — End: 2018-09-12
  Administered 2018-09-12: 25 mL/h via INTRAVESICAL
  Filled 2018-09-12: qty 250

## 2018-09-12 MED ORDER — LIDOCAINE (PF) 100 MG/5 ML (2 %) INTRAVENOUS SYRINGE
INJECTION | Freq: Once | INTRAVENOUS | Status: DC | PRN
Start: 2018-09-12 — End: 2018-09-12
  Administered 2018-09-12: 100 mg via INTRAVENOUS

## 2018-09-12 MED ORDER — HYDROMORPHONE 0.5 MG/0.5 ML INJECTION SYRINGE
0.5000 mg | INJECTION | INTRAMUSCULAR | Status: DC | PRN
Start: 2018-09-12 — End: 2018-09-12
  Administered 2018-09-12: 0.5 mg via INTRAVENOUS
  Filled 2018-09-12: qty 0.5

## 2018-09-12 MED ORDER — MIDAZOLAM 1 MG/ML INJECTION SOLUTION
Freq: Once | INTRAMUSCULAR | Status: DC | PRN
Start: 2018-09-12 — End: 2018-09-12
  Administered 2018-09-12: 2 mg via INTRAVENOUS

## 2018-09-12 MED ORDER — KETOROLAC 30 MG/ML (1 ML) INJECTION SOLUTION
Freq: Once | INTRAMUSCULAR | Status: DC | PRN
Start: 2018-09-12 — End: 2018-09-12
  Administered 2018-09-12: 30 mg via INTRAVENOUS

## 2018-09-12 MED ORDER — ONDANSETRON HCL (PF) 4 MG/2 ML INJECTION SOLUTION
Freq: Once | INTRAMUSCULAR | Status: DC | PRN
Start: 2018-09-12 — End: 2018-09-12
  Administered 2018-09-12: 4 mg via INTRAVENOUS

## 2018-09-12 MED ORDER — BUPIVACAINE (PF) 0.25 % (2.5 MG/ML) INJECTION SOLUTION
20.00 mL | Freq: Once | INTRAMUSCULAR | Status: AC
Start: 2018-09-12 — End: 2018-09-12
  Administered 2018-09-12: 6 mL via INTRAMUSCULAR
  Filled 2018-09-12: qty 30

## 2018-09-12 MED ORDER — ONDANSETRON 8 MG DISINTEGRATING TABLET
8.0000 mg | ORAL_TABLET | Freq: Three times a day (TID) | ORAL | 0 refills | Status: AC | PRN
Start: 2018-09-12 — End: ?

## 2018-09-12 MED ORDER — PROCHLORPERAZINE EDISYLATE 10 MG/2 ML (5 MG/ML) INJECTION SOLUTION
10.0000 mg | Freq: Once | INTRAMUSCULAR | Status: DC | PRN
Start: 2018-09-12 — End: 2018-09-12

## 2018-09-12 MED ORDER — EPHEDRINE SULFATE 50 MG/ML INJECTION SOLUTION
Freq: Once | INTRAMUSCULAR | Status: DC | PRN
Start: 2018-09-12 — End: 2018-09-12
  Administered 2018-09-12: 10 mg via INTRAVENOUS

## 2018-09-12 MED ORDER — ROCURONIUM 10 MG/ML INTRAVENOUS SOLUTION
Freq: Once | INTRAVENOUS | Status: DC | PRN
Start: 2018-09-12 — End: 2018-09-12
  Administered 2018-09-12: 10 mg via INTRAVENOUS
  Administered 2018-09-12: 30 mg via INTRAVENOUS

## 2018-09-12 MED ORDER — PROPOFOL 10 MG/ML IV BOLUS
INJECTION | Freq: Once | INTRAVENOUS | Status: DC | PRN
Start: 2018-09-12 — End: 2018-09-12
  Administered 2018-09-12: 150 mg via INTRAVENOUS

## 2018-09-12 MED ORDER — FENTANYL (PF) 50 MCG/ML INJECTION SOLUTION
Freq: Once | INTRAMUSCULAR | Status: DC | PRN
Start: 2018-09-12 — End: 2018-09-12
  Administered 2018-09-12: 50 ug via INTRAVENOUS
  Administered 2018-09-12: 100 ug via INTRAVENOUS
  Administered 2018-09-12: 50 ug via INTRAVENOUS
  Administered 2018-09-12: 100 ug via INTRAVENOUS
  Administered 2018-09-12: 50 ug via INTRAVENOUS

## 2018-09-12 MED ADMIN — heparin (porcine) 1,000 unit/mL injection solution: @ 16:00:00

## 2018-09-12 MED ADMIN — Medication: INTRAVENOUS | @ 14:00:00

## 2018-09-12 SURGICAL SUPPLY — 100 items
APPL 70% ISPRP 2% CHG 26ML CHLRPRP HI-LT ORNG PREP STRL LF  DISP CLR (WOUND CARE SUPPLY) ×3 IMPLANT
BLADE 11 BD SAF PROTECT LOCK R_TRCT SHLD SS SURG STRL LF (CUTTING ELEMENTS) ×1
BLADE 11 BD SHARP LOCK RETRACT SHIELD SS SURG STRL LF  DISP (SURGICAL CUTTING SUPPLIES) ×3 IMPLANT
CLOSURE SKIN STRIPS 1/2X4IN R1547 6/PK 50PK/BX (WOUND CARE/ENTEROSTOMAL SUPPLY)
CONV USE ITEM 306873 - COVER STAND 23IN MAYO CNVRT PLASTIC REINF STRL LF  DISP (DRAPE/PACKS/SHEETS/OR TOWEL) ×3 IMPLANT
CONV USE ITEM 337902 - KIT SURG LRG STUP NONST DISP LF (CUSTOM TRAYS & PACK) ×3 IMPLANT
CONV USE ITEM 338068 - KIT SURG SM STUP NONST DISP LF (CUSTOM TRAYS & PACK) IMPLANT
COVER STAND 23IN MAYO CNVRT PL ASTIC REINF STRL LTX DISP (DRAPE/PACKS/SHEETS/OR TOWEL) ×1
COVER STAND 23IN MAYO CNVRT PLASTIC REINF STRL LF  DISP (DRAPE/PACKS/SHEETS/OR TOWEL) ×3
DEVICE ENDOS ENDO PNUT 5MM 45CM SWAB COTTON DISP (ENDOSCOPIC SUPPLIES) ×3
DEVICE ENDOS ENDO PNUT 5MM 45C_M SWAB COTTON DISP (INSTRUMENTS ENDOMECHANICAL) ×1
DISC USE ITEM 163322 - SYRINGE LL 20ML LTX STRL MED (NEEDLES & SYRINGE SUPPLIES) ×3 IMPLANT
DISCONTINUED USE 330077 - SKNCLS PREMIERPRO EXOFINFUSION 22CM MCBL BARRIER MESH SYSTEM STRL LF (SEALANTS) ×2
DISCONTINUED USE ITEM 330077 - SKNCLS PREMIERPRO EXOFINFUSION 22CM MCBL BARRIER MESH SYSTEM STRL LF (SEALANTS) ×3 IMPLANT
DRAPE LAVH REINF FENESTRATE AR MBRD CVR FL CNTRL PCH 125X124 (PROTECTIVE PRODUCTS/GARMENTS) ×1
DRAPE LAVH REINF FENESTRATE ARMBRD CVR FL CNTRL PCH 125X124X30IN CNVRT LF  STRL DISP SURG TBRN (PROTECTIVE PRODUCTS/GARMENTS) ×3 IMPLANT
DRAPE PCH PORT 26X18IN UNDR BUTT SURG CLR (PROTECTIVE PRODUCTS/GARMENTS) ×3
DRAPE PCH PORT 26X18IN UNDR BU_TT SURG CLR (PROTECTIVE PRODUCTS/GARMENTS) ×1
DRAPE PCH PORT 39X46IN UNDR BUTT SURG CLR (PROTECTIVE PRODUCTS/GARMENTS) ×3 IMPLANT
DSCT LAPSCP 2 JAW CORD BIPOLAR COAGULATE HANDPC 33CM 5MM PKS OMNI STRL DISP (ENDOSCOPIC SUPPLIES) ×3 IMPLANT
DSCT LAPSCP 2 JAW CORD BIPOLAR_COAG HANDPC 33CM 5MM PKS OMNI (INSTRUMENTS ENDOMECHANICAL) ×2
ENDOCATCH 10MM SPEC POUCH_173050G 6EA/BX (INSTRUMENTS ENDOMECHANICAL)
ENDOSTITCH RELOAD POLYSOR 2-0 170051 POLYSORB 12/BX (INSTRUMENTS ENDOMECHANICAL) ×4
FORCEPS BIPOLAR 33CM 5MM PKS H ALO LED ROT WHL SRR JAW CUT (ENDOSCOPIC SUPPLIES) ×3 IMPLANT
FORCEPS BIPOLAR 33CM 5MM PKS H ALO LED ROT WHL SRR JAW CUT (INSTRUMENTS ENDOMECHANICAL) ×1
GARMENT COMPRESS LRG CALF CENTAURA NYL VASOGRAD LTWT BRTHBL SEQ FIL BLU 24- IN (ORTHOPEDICS (NOT IMPLANTS)) IMPLANT
GARMENT COMPRESS LRG CALF CENT_AURA NYL VASOGRAD LTWT BRTHBL (ORTHOPEDICS (NOT IMPLANTS))
GARMENT COMPRESS MED CALF CENT AURA NYL VASOGRAD LTWT BRTHBL (ORTHOPEDICS (NOT IMPLANTS)) ×1
GARMENT COMPRESS MED CALF CENTAURA NYL VASOGRAD LTWT BRTHBL SEQ FIL BLU 18- IN (ORTHOPEDICS (NOT IMPLANTS)) ×3 IMPLANT
GLOVE SURG 7 LTX 3 PLN MLD PWD R BEAD CUF ANSLP STRL BRN 12IN (GLOVES AND ACCESSORIES) ×6 IMPLANT
GLOVE SURG TRIFLEX LATEX 7.5 2D7254 40PR/BX STRL PWD (GLOVES AND ACCESSORIES) ×4 IMPLANT
GOWN SURG XL AAMI L4 IMPRV REI NF BRTHBL STRL LF DISP CNVRT (PROTECTIVE PRODUCTS/GARMENTS) ×8 IMPLANT
GOWN SURG XL STD AAMI L3 NONRE INFORCE STRL BACK SET IN SLEEV (PROTECTIVE PRODUCTS/GARMENTS) ×8 IMPLANT
GRASPER ENDOBABOCOCK 10M (INSTRUMENTS ENDOMECHANICAL) ×1
GRASPER ENDOS RATCHET HNDL ROT KNOB TROCAR 39CM 10MM EPTH BBCK 360D STRL LF  DISP (ENDOSCOPIC SUPPLIES) ×3 IMPLANT
HANDPC ESURG 35CM 5MM THUNDERBEAT FRN ACTUATE GRIP S STRL LF  DISP (SURGICAL CUTTING SUPPLIES) ×3 IMPLANT
HANDPC ESURG 35CM 5MM THUNDERB_EAT FRN GRIP (CUTTING ELEMENTS) ×1
IRR SUCT STRKFL2 TIP STRL LF  DISP (IRR) IMPLANT
IRR SUCT STRKFL2 TIP STRL LF_DISP (IRR)
KIT SURG SM STUP NONST DISP LF (CUSTOM TRAYS & PACK)
MANIPULATR SURG 4.5MM ZUMI-ZINNATI UTER ERGONOMICAL CURVE INJECTOR 12IN STRL LF  DISP (SURGICAL INSTRUMENTS) ×3 IMPLANT
MANIPULATR SURG 4.5MM ZUMI-ZIN_NATI UTER ERGONOMICAL CURVE (INSTRUMENTS) ×2
MANIPULATR UTER 34MM VCARE + MED CUP STRL LF  DISP (ENDOSCOPIC SUPPLIES) ×3 IMPLANT
MANIPULATR UTER 34MM VCARE + M_ED CUP STRL LF DISP (INSTRUMENTS ENDOMECHANICAL) ×1
MBO USE ITEM 130230 - DEVICE ENDOS ENDO PNUT 5MM 45CM SWAB COTTON DISP (ENDOSCOPIC SUPPLIES) ×3 IMPLANT
MRCLTR BIPOLAR PKS PLASMASORD (ENDOSCOPIC SUPPLIES) ×3 IMPLANT
NEEDLE HYPO  18GA 1.5IN REG WL SFGLD POLYPROP REG BVL LL SHIELD MECH DEHP-FR IM PNK STRL LF  DISP (NEEDLES & SYRINGE SUPPLIES) ×3 IMPLANT
NEEDLE HYPO  25GA 1.5IN MONOJECT MAGELLAN SS BVL ORT SLF LEVEL SHEATH SFSHLD STD LL SYRG ORNG STRL (Syringes w/ o Needles) ×3 IMPLANT
NEEDLE HYPO  25GA 1.5IN REG WL PRCSNGL SS POLYPROP REG BVL LL HUB DEHP-FR BLU STRL LF  DISP (NEEDLES & SYRINGE SUPPLIES) ×3 IMPLANT
NEEDLE HYPO 18GA 1.5IN REG WL_SFGLD POLYPROP LL HUB SHIELD (NEEDLES & SYRINGE SUPPLIES) ×1
NEEDLE HYPO 25GA 1.5IN MONOJE CT MGLN SS BVL ORT SLF LEVEL (Syringes w/ o Needles) ×1
NEEDLE HYPO 25GA 1.5IN REG WL_REG BVL BD POLYPROP LL HUB (NEEDLES & SYRINGE SUPPLIES) ×1
NEEDLE INSFL 100MM 14GA STEP PNMPRTN TROCAR BLDLS RADIAL EXPD SLEEVE BLUNT STY VRSTP + SS (ENDOSCOPIC SUPPLIES) ×3 IMPLANT
PACK LRG SET UP_DYNJ906636A CS/2 (CUSTOM TRAYS & PACK) ×1
PACK SM SET UP DYNJ906635 (CUSTOM TRAYS & PACK)
POUCH SPEC RETR 34.5CM 10MM E-CTCH GLD POLYUR ERG HNDL LONG CYL TUBE 29.5CM LAPSCP LF  DISP (ENDOSCOPIC SUPPLIES) IMPLANT
RELOAD SUT AST 2-0 ES-9 48IN 1 STCH ENDO STCH POLYSRB LACTOMER DISP UNDYED (ENDOSCOPIC SUPPLIES) ×3 IMPLANT
RELOAD SUT AST 2-0 ES-9 7IN ENDO STCH POLYSRB DISP VIOL (ENDOSCOPIC SUPPLIES) ×12 IMPLANT
SEAL ENDOS SHEATH SCP WRMR LAPSCP STRL LF  DISP (ENDOSCOPIC SUPPLIES) ×3 IMPLANT
SEAL SCOPE WARMER C3101 BX/20 (INSTRUMENTS ENDOMECHANICAL) ×1
SEALDIVD LAPSCP 37CM 10MM LIGASURE STR BLUNT JAW STRL LF  DISP (ENDOSCOPIC SUPPLIES) ×3 IMPLANT
SEALDIVD LAPSCP 37CM 10MM LIGA_SURE STR BLNT JAW STRL LF (INSTRUMENTS ENDOMECHANICAL) ×1
SEALDIVD LAPSCP 37CM 5MM LIGAS URE 180D 17.8MM 2 ACT JAW BLNT (INSTRUMENTS ENDOMECHANICAL) ×1
SEALDIVD LAPSCP 37CM 5MM LIGASURE 180D 17.8MM 2 ACT JAW BLUNT CONTOUR TIP NANO COAT 19.5MM STRL DISP (ENDOSCOPIC SUPPLIES) ×3 IMPLANT
SET 81IN REG CLAMP N-PYRG IRRG 10 GTT/ML STR CSCP DEHP (IV Tubing & Accessories)
SET 81IN REG CLAMP N-PYRG IRRG 10 GTT/ML STR CSCP DEHP BLADDER STRL LF (IV TUBING & ACCESSORIES) IMPLANT
SOL ANFG DFGR ISOPRPNL PAD OVAL BTL NABRSV ADH STRL LF  DISP (KITS & TRAYS (DISPOSABLE)) ×3 IMPLANT
SOL IRRG 0.9% NACL 1000ML PLASTIC PR BTL ISTNC N-PYRG STRL LF (SOLUTIONS) IMPLANT
SOL IRRG 0.9% NACL 500ML PLASTIC PR BTL ISTNC N-PYRG STRL LF (SOLUTIONS) ×3 IMPLANT
SOL IV 0.9% NACL PVC 1000ML PH 5 PLASTIC CONTAINR PRSV FR VIAFLEX LF (SOLUTIONS) ×6 IMPLANT
SOLUTION ANTI FOG W/SPONGE 280101 DEFOG ENDOMATE 20EA/CS (KITS & TRAYS (DISPOSABLE)) ×1
SOLUTION IRRG NS 500CC 2F7123_18/CS (SOLUTIONS) ×1
SOLUTION IV NS 1000CC 2B1324X_14/CS (SOLUTIONS) ×4
STRIP 4X.5IN STRSTRP PLSTR REINF SKNCLS WHT STRL LF (WOUND CARE SUPPLY) IMPLANT
SUTURE 2-0 CT1 VICRYL 27IN UND YED BRD COAT ABS (SUTURE/WOUND CLOSURE) ×1
SUTURE 2-0 CT1 VICRYL 27IN UNDYED BRD COAT ABS (SUTURE/WOUND CLOSURE) ×3 IMPLANT
SUTURE 4-0 PC-3 MONOCRYL MTPS 18IN UNDYED MONOF ABS (SUTURE/WOUND CLOSURE) ×9 IMPLANT
SUTURE 4-0 PC-3 MONOCRYL MTPS_18IN UNDYED MONOF ABS (SUTURE/WOUND CLOSURE) ×3
SUTURE 4-0 PS2 MONOCRYL MTPS 18IN UNDYED MONOF ABS (SUTURE/WOUND CLOSURE) ×6 IMPLANT
SUTURE 4-0 PS2 MONOCRYL MTPS 1_8IN UNDYED MONOF ABS (SUTURE/WOUND CLOSURE) ×2
SUTURE ENDOSTITCH SZ 2-0 48IN_170057 UNDYED 12/BX (INSTRUMENTS ENDOMECHANICAL) ×1
SUTURE SILK 0 FSL PERMAHAND 18IN BLK BRD NONAB (SUTURE/WOUND CLOSURE) ×3 IMPLANT
SYRINGE LL 10ML LF  STRL GRAD N-PYRG DEHP-FR PVC FREE MED DISP (NEEDLES & SYRINGE SUPPLIES) ×3 IMPLANT
SYRINGE LL 10ML LF STRL MED D ISP (NEEDLES & SYRINGE SUPPLIES) ×1
SYRINGE LL 5ML LF STRL GRAD M ED POLYPROP DISP CLR (NEEDLES & SYRINGE SUPPLIES) ×1
SYRINGE LL 5ML LF STRL GRAD N-PYRG DEHP-FR PVC FREE MED DISP CLR (NEEDLES & SYRINGE SUPPLIES) ×3 IMPLANT
SYSTEM CLOSURE SKIN 4X22CM_PREMIERPRO EXOFIN FUSION (SEALANTS) ×1
TRAY CATH 16FR LUBRISIL FOLEY_ADV DRAIN BAG ANTIREFLUX (CATHETERS) ×3 IMPLANT
TRAY CATH 16FR LUBRISIL STATLK_SAF-FLOW FOLEY URMTR ADV (TRAY) ×4 IMPLANT
TRAY SKIN SCRUB 8IN VNYL COTTON 6 WNG 6 SPONGE STICK 2 TIP APPL DRY STRL LF (KITS & TRAYS (DISPOSABLE)) ×3 IMPLANT
TRAY SURG PREP SCR CR ESTM (KITS & TRAYS (DISPOSABLE)) ×1
TROCAR 5MM W/EXPAND SLEEVE DIL_VS101005 3EA/BX (INSTRUMENTS ENDOMECHANICAL) ×1
TROCAR BLADELESS 11MM STD NONB11STF VERSAPORT 6EA/BX (INSTRUMENTS ENDOMECHANICAL) ×2
TROCAR BLADELESS 5MM NONB5STF 6EA/BX DISP (INSTRUMENTS ENDOMECHANICAL) ×1
TROCAR LAPSCP STD 100MM 11MM VERSAONE FIX CANN BLDLS DLPHN NOSE TIP STRL LF  DISP (ENDOSCOPIC SUPPLIES) ×6 IMPLANT
TROCAR LAPSCP STD 100MM 5MM VERSAONE FIX CANN BLDLS DLPHN NOSE TIP STRL LF  DISP (ENDOSCOPIC SUPPLIES) ×3 IMPLANT
TROCAR LAPSCP STD 5MM VRSTP BLDLS RADIAL EXPD SLEEVE CANN DIL STRL LF  DISP (ENDOSCOPIC SUPPLIES) ×3 IMPLANT
TRUMPET SNGL SOL CS/10 CD8185 (Suction) ×4 IMPLANT
TUBE PNEUMO HEATED BX/10 0620040690 (Connecting Tubes/Misc) ×1
TUBING INSFL PNEUMOSURE THRMPLST 12.52X5.83IN C19.84LB 150VA 18.7IN 50104F HEAT HI FLOW RATE SET (Connecting Tubes/Misc) ×3 IMPLANT

## 2018-09-12 NOTE — Nurses Notes (Signed)
BS checked per [facility] CRNA, pt states she feels "funny." BS 74, notified Damien at this time.

## 2018-09-12 NOTE — OR Surgeon (Signed)
[facility]  [place], [place] [address]                                  OPERATIVE NOTE  Patient Name: Julie, Browning Number: [id number]  Date of Service: 09/12/2018   Date of Birth: 22-Jun-1961      Pre-Operative Diagnosis: LEIOMYOMA OF UTERUS    Post-Operative Diagnosis:  LEIOMYOMA OF UTERUS      Procedure(s):  Procedure(s):  HYSTERECTOMY TOTAL LAPAROSCOPIC  SALPINGECTOMY LAPAROSCOPIC  CYSTOSCOPY    Complications:  None     Surgeon: Estrella Myrtle, MD  Assistant: Hott, DO  No other qualified surgical assistant was available at the time of this procedure to my knowledge.     Anesthesia: General    Estimated Blood Loss:  Minimal  Specimen: Uterus, tubes.    Findings: Uterus mildly enlarged, approximately 8-week size, irregular contour consistent with fibroids. Normal ovaries.  Left tube normal.  Right tube absent. Normal upper abdomen. At procedure end, cystoscopy revealed neither damage to nor sutures within the bladder, normal efflux of urine was noted from both ureteral orifices. A digital vaginal exam revealed good cuff integrity.            Condition: STABLE    Disposition: PACU          Time out performed prior the start of this procedure.    DESCRIPTION OF PROCEDURE: The patient was taken to the operating room, given general anesthesia, prepped and draped in the usual sterile fashion, placed in the dorsal lithotomy position with legs in adjustable stirrups. A Foley catheter was placed. A speculum was placed in the vagina. The anterior lip of the cervix was grasped with a single tooth tenaculum and retracted downward. The uterus was then sounded. The appropriately sized V-care uterine manipulator was then advanced into the endometrial cavity. The bulb filled with 9 cc of air. The cup was situated against the cervix with no intervening vaginal tissue and the blue sleeve was advanced to lock the device into place. It was secured to the anterior lip of the cervix with a  single suture of silk suture which was passed through the cup prior to placement. The speculum was removed. Attention was then turned to the patient's abdomen where the infraumbilical region was infiltrated using 0.25% Marcaine. A small 5 millimeter incision was made in this region using the scalpel.  The Step Veress needle and sleeve were advanced through the incision into the abdominal cavity with placement confirmed using hanging drop test, pneumoperitoneum created using CO2 gas to a maximum pressure of 15 mm Hg. The Veress needle was removed. A 5 millimeter disposable trocar was then advanced through the sleeve into the intraabdominal cavity.  The patient placed in Trendelenburg. The uterus elevated and the 5 millimeter 30 degree laparoscope was introduced. Pelvis was explored with findings as noted above. Two additional inferior ports are placed. These are 10 millimeter placed in either lower quadrant after local infiltration with 0.25% Marcaine. Skin incision with a scalpel. All under direct visualization with excellent hemostasis noted.The assistant operated the camera to provide optimal visualization of the area around the port placements to assure no injury to bowel, blood vessel, nerve, or other structure. The left ureter was visualized and noted to be well inferior of the adnexal region. The left tubo-ovarian junction was then sealed and transected using the Olympus Thunderbeat device, thus liberating the  tube from the ovary. This process was facilitated by the assistant grasping the distal tube with an atraumatic grasper providing retraction and visualization. The utero-ovarian ligament on this side was then likewise sealed and transected using the Thunderbeat device. The assistant actively facilitated this process by grasping the uterine cornual region to provide retraction and exposure.  This dissection was carried down to and through the level of the round ligament sequentially sealing and transecting  each pedicle with the Olympus Thunderbeat device with excellent hemostasis noted. The assistant actively facilitated this process by sequentially grasping the uterine cornual region and proximal round ligament to provide retraction and exposure. The anterior leaflet of the broad ligament was brought across the anterior lower segment to begin the bladder flap. The uterine vessels were skeletonized and then were sealed and transected using the Olympus Thunderbeat device. The remaining fascial tissues overlying the colpotomy site were then taken down using the Olympus Thunderbeat device. The assistant was instrumental during this portion of the procedure to provide adequate visualization by adjusting the angle of view with the 30 degree laparoscope as dictated by the patient's anatomy. The same procedures were then performed on the opposite side without incident. At this point, the colpotomy was begun using the Olympus Thunderbeat device along the ridge of the V care cup. This was completed circumferentially thus completing the colpotomy. The uterus was then removed vaginally. The vagina was then occluded to maintain pneumoperitoneum. The pelvis was copiously irrigated using warm normal saline. All pedicles were inspected and noted to be hemostatic. The vaginal cuff was then closed using interrupted figure of eight sutures of 0 Vicryl using extracorporeal knots. The vaginal cuff was checked digitally and no defects were noted. At this point, the cystoscopy was performed using 20% mannitol solution as distention medium with findings as noted above. The abdomen incisions were closed as follows: A 5 millimeter incision were closed using single buried interrupted sutures of 4-0 Monocryl and the 10 millimeter incisions were closed using running subcuticular sutures of 4-0 Monocryl. The sponge, lap and needle counts were correct x 2. The patient tolerated the procedure well, was taken to the recovery room, awake, in stable  condition.   Dorethea Clan, MD

## 2018-09-12 NOTE — Nurses Notes (Signed)
Called Dr. Nicole Kindred to address possible need for HCG. No order received. Also addressed consent. Pt states she does not want ovaries taken to day. MD agrees. Noted consent needs reviewed. Consent flagged on front of chart.

## 2018-09-12 NOTE — H&P (Signed)
There have been no changes to the H&P.    D. Kaznoski, MD

## 2018-09-12 NOTE — Anesthesia Postprocedure Evaluation (Signed)
Anesthesia Post Op Evaluation    Patient: Julie Browning  Procedure(s):  CYSTOSCOPY  HYSTERECTOMY TOTAL LAPAROSCOPIC  SALPINGECTOMY LAPAROSCOPIC    Last Vitals:Temperature: 36.4 C (97.5 F) (09/12/18 1528)  Heart Rate: (!) 104 (09/12/18 1528)  BP (Non-Invasive): 116/81 (09/12/18 1528)  Respiratory Rate: 14 (09/12/18 1528)  SpO2: 93 % (09/12/18 1528)    Patient is sufficiently recovered from the effects of anesthesia to participate in the evaluation and has returned to their pre-procedure level.  Patient location during evaluation: PACU   Post-procedure handoff checklist completed    Patient participation: complete - patient participated  Level of consciousness: awake and alert and responsive to verbal stimuli  Pain management: adequate  Airway patency: patent  Anesthetic complications: no  Cardiovascular status: acceptable  Respiratory status: acceptable  Hydration status: acceptable  Patient post-procedure temperature: Pt Normothermic   PONV Status: Absent

## 2018-09-12 NOTE — Nurses Notes (Signed)
Dr. Nicole Kindred in to see pt and gave procedure results. Pt tearful but states "I'm not sad". IV noted to be disconnected upon assessment.  Reconnected, infusing but positional.

## 2018-09-12 NOTE — Anesthesia Transfer of Care (Signed)
ANESTHESIA TRANSFER OF CARE   Julie Browning is a 57 y.o. ,female, Weight: 80.3 kg (177 lb)   had Procedure(s):  CYSTOSCOPY  HYSTERECTOMY TOTAL LAPAROSCOPIC  SALPINGECTOMY LAPAROSCOPIC  performed  09/12/18   Primary Service: Estrella Myrtle, MD    Past Medical History:   Diagnosis Date   . Anesthesia complication     hypotension with epidural; crying s/p abdominoplasty   . Anxiety    . Arthritis     neck and hands   . Asthma     teenager; most recent occurance age 39   . Back problem     sciatica   . C. difficile diarrhea 10/2013    taking flagyl to treat   . Colon polyp    . Depression    . GERD (gastroesophageal reflux disease)     extra pillow   nexium   . Hiatal hernia    . Irritable bowel syndrome    . Neck problem     DDD cerviacl spine with good ROM   . Positive PPD     tests positive, but negative chest xrays   . Ringing in ears    . Sleep apnea     uses BiPAP occasionally / being fitted for mouth piece instead    . Upper respiratory infection     some congestion vs allergies    . Wears glasses       Allergy History as of 09/12/18     ADHESIVE       Noted Status Severity Type Reaction    08/28/18 1116 Alberteen Sam, RN 08/28/18 Active                 I completed my transfer of care / handoff to the receiving personnel during which we discussed:  Access, Airway, All key/critical aspects of case discussed, Analgesia, Antibiotics, Expectation of post procedure, Fluids/Product, Gave opportunity for questions and acknowledgement of understanding, Labs and PMHx                                                                    Last OR Temp: Temperature: 36.4 C (97.5 F)  ABG:  POTASSIUM   Date Value Ref Range Status   08/28/2018 3.9 3.4 - 5.1 mmol/L Final   10/30/2013 4.3 3.5 - 5.0 mmol/L Final     KETONES   Date Value Ref Range Status   08/28/2018 Negative Negative mg/dL Final     KETONES,URINE   Date Value Ref Range Status   10/30/2013 NEGATIVE NEGATIVE mg/dL Final     CALCIUM   Date Value Ref Range  Status   08/28/2018 9.3 8.6 - 10.3 mg/dL Final   10/30/2013 9.2 8.5 - 10.5 mg/dL Final     Airway:  EndoTracheal Tube (Active)     Blood pressure 116/81, pulse (!) 104, temperature 36.4 C (97.5 F), resp. rate 14, height 1.651 m (5\' 5" ), weight 80.3 kg (177 lb), SpO2 93 %.

## 2018-09-12 NOTE — OR PostOp (Signed)
Patient discharged home with family.  AVS reviewed with patient/care giver.  A written copy of the AVS and discharge instructions was given to the patient/care giver.  Questions sufficiently answered as needed.  Patient/care giver encouraged to follow up with PCP as indicated.  In the event of an emergency, patient/care giver instructed to call 911 or go to the nearest emergency room.

## 2018-09-12 NOTE — Discharge Instructions (Signed)
[facility]  [place], [place] [address]     Anesthesia Discharge Instructions    1.  Do NOT drive an automobile today.    2.  Do NOT sign any legal documents for 24 hours.    3.  Do NOT operate any electrical equipment today.    Fall risk prevention education has been reviewed with the patient/significant other.

## 2018-09-12 NOTE — Nurses Notes (Signed)
Pt states ok to use regular tape. Noted adhesives allergy. Pt states only bothersome when left on awhile. Denies need for paper tape.

## 2018-09-12 NOTE — Anesthesia Preprocedure Evaluation (Signed)
ANESTHESIA PRE-OP EVALUATION  Planned Procedure: SALPINGO OOPHRECTOMY LAPAROSCOPIC (Bilateral )  CYSTOSCOPY (N/A )  HYSTERECTOMY SUPRACERVICAL LAPAROSCOPIC WITH MORCELLATOR (N/A )  Review of Systems     anesthesia history negative     patient summary reviewed  nursing notes reviewed        Pulmonary   asthma and sleep apnea,   Cardiovascular  negative cardio ROS,   ECG reviewed  Exercise Tolerance: > or = 4 METS        GI/Hepatic/Renal    hiatal hernia, GERD and well controlled     Endo/Other   neg endo/other ROS,        Neuro/Psych/MS    anxiety and depression     Cancer  negative hematology/oncology ROS,                    Physical Assessment      Patient summary reviewed and Nursing notes reviewed   Airway       Mallampati: II    TM distance: >3 FB    Neck ROM: full  Mouth Opening: good.            Dental           (+) implants, caps        Comment: Extensive dental work including upper bridge/caps       Pulmonary    Breath sounds clear to auscultation       Cardiovascular    Rhythm: regular  Rate: Normal       Other findings            Plan  Planned anesthesia type: GETA        ASA 3     Intravenous induction     Anesthetic plan and risks discussed with patient.     Anesthesia issues/risks discussed are: Dental Injuries, Nerve Injuries, Eye /Visual Loss, PONV and Sore Throat.        Patient's NPO status is appropriate for Anesthesia.           Plan discussed with CRNA.    ([place])

## 2018-09-17 LAB — HISTORICAL SURGICAL PATHOLOGY SPECIMEN

## 2018-10-27 ENCOUNTER — Other Ambulatory Visit: Payer: Self-pay | Admitting: Physician Assistant

## 2018-12-22 ENCOUNTER — Emergency Department
Admission: EM | Admit: 2018-12-22 | Discharge: 2018-12-22 | Disposition: A | Discharge status: Home / Self Care | Attending: Emergency Medicine | Admitting: Emergency Medicine

## 2018-12-22 ENCOUNTER — Emergency Department

## 2018-12-22 DIAGNOSIS — J40 Bronchitis, not specified as acute or chronic: Secondary | ICD-10-CM | POA: Insufficient documentation

## 2018-12-22 DIAGNOSIS — Z720 Tobacco use: Secondary | ICD-10-CM

## 2018-12-22 DIAGNOSIS — F17210 Nicotine dependence, cigarettes, uncomplicated: Secondary | ICD-10-CM | POA: Insufficient documentation

## 2018-12-22 DIAGNOSIS — J45909 Unspecified asthma, uncomplicated: Secondary | ICD-10-CM | POA: Insufficient documentation

## 2018-12-22 DIAGNOSIS — R059 Cough, unspecified: Secondary | ICD-10-CM

## 2018-12-22 MED ORDER — ALBUTEROL SULFATE (2.5 MG/3ML) 0.083% IN NEBU
2.50 mg | INHALATION_SOLUTION | Freq: Once | RESPIRATORY_TRACT | Status: AC
Start: 2018-12-22 — End: 2018-12-22
  Administered 2018-12-22: 06:00:00 2.5 mg via RESPIRATORY_TRACT
  Filled 2018-12-22: qty 3

## 2018-12-22 MED ORDER — SPACER/AERO-HOLD CHAMBER BAGS MISC
0 refills | Status: DC
Start: 2018-12-22 — End: 2022-10-16

## 2018-12-22 MED ORDER — LEVOFLOXACIN 500 MG PO TABS
500.00 mg | ORAL_TABLET | Freq: Every day | ORAL | 0 refills | Status: AC
Start: 2018-12-22 — End: 2018-12-27

## 2018-12-22 MED ORDER — ALBUTEROL SULFATE HFA 108 (90 BASE) MCG/ACT IN AERS
2.00 | INHALATION_SPRAY | Freq: Once | RESPIRATORY_TRACT | Status: AC
Start: 2018-12-22 — End: 2018-12-22
  Administered 2018-12-22: 07:00:00 2 via RESPIRATORY_TRACT
  Filled 2018-12-22: qty 1

## 2018-12-22 NOTE — ED Notes (Signed)
We have no spacers here, I did the teaching and pt will get an RX for one

## 2018-12-22 NOTE — Discharge Instructions (Signed)
Cough    You have been seen for your cough.    There are many possible causes of cough. Most are not dangerous. Your doctor has determined that it is OK for you to go home today.    Your doctor believes your cough was caused by bacteria. Your doctor prescribed an antibiotic that will fight the bacteria.    The doctor may have prescribed some medicine to help with your cough. Use the medicine as directed.    YOU SHOULD SEEK MEDICAL ATTENTION IMMEDIATELY, EITHER HERE OR AT THE NEAREST EMERGENCY DEPARTMENT, IF ANY OF THE FOLLOWING OCCURS:   You wheeze or have trouble breathing.   You cough up mucous or lose weight for no reason.   You have a fever (temperature higher than 100.17F / 38C) that lasts more than 5 days.   You have chest pain.   Your symptoms get worse or do not get better in 2 or 3 days.   You have any new problems or concerns.    Continue use of prednisone as recommended by Dr Megan Salon     Bronchitis    You have been diagnosed with bronchitis.    Bronchitis is an irritation of the large breathing tubes. It can be caused by tobacco smoke, air pollution, or an infection. Patients with bronchitis are short of breath and may cough up green or yellow mucous. These symptoms are usually worse at night, when lying flat and in wet weather. Most people with bronchitis do not need antibiotics. If your doctor prescribes antibiotics, fill the prescription and take all the medicine according to the instructions.    Bronchitis is usually treated with medicine to help stop coughing. An inhaler with albuterol (Ventolin/Proventil) is sometimes used to help with cough. It is best to use the inhaler with a spacer to help the medicine reach your lungs. The doctor can prescribe a spacer.    Bronchitis is usually caused by a virus. Antibiotics do not kill viruses. In fact, antibiotics do not affect viruses in any way. In the past, some doctors prescribed antibiotics for people with bronchitis. We now know that  antibiotics are not helpful for most bronchitis patients. Patients who might need antibiotics are those with lung problems that don't go away, like emphysema or COPD.    Your coughing and wheezing might last for 2 or 3 weeks! The symptoms should get better over this time period and not worse.    Your doctor prescribed an albuterol (Ventolin/Proventil) inhaler. Use it every four hours if you are wheezing, coughing, or short of breath. This will reduce symptoms.    Do not smoke. Research shows that smoking causes heart disease, cancer, and birth defects. Avoiding smoking will help your asthma. If you smoke, ask your doctor for ideas about how to stop.   If you do not smoke, avoid others who do.    YOU SHOULD SEEK MEDICAL ATTENTION IMMEDIATELY, EITHER HERE OR AT THE NEAREST EMERGENCY DEPARTMENT, IF ANY OF THE FOLLOWING OCCURS:   You wheeze or have trouble breathing.   You have a fever (temperature higher than 100.17F / 38C), that won't go away.   You have chest pain.   You vomit or cannot keep liquids down or you feel weak or dizzy.   Your symptoms get worse over the next 2 or 3 days.     Smoking Dangers    Smoking is dangerous to your health! Smoking causes chronic obstructive pulmonary disease. It also increases risk of heart  disease, cancer and stroke. If you keep smoking, your lung disease will worsen. There are many medicines and aids to help you stop smoking. Ask your doctor for information.

## 2018-12-22 NOTE — ED Triage Notes (Signed)
Patient states getting URI symptoms and went to doc March 1 and dx with strep. Went back around the 3rd and also dx with flu. Went back to doctor 4 days ago and told she had secondary URI and was put on Levaquin and Steroids. Since then, she accidentally was taking the antibiotic twice daily. States she just feels worse since last night which prompted her to come to ED this morning.

## 2018-12-22 NOTE — ED Provider Notes (Signed)
Physician/Midlevel provider first contact with patient: 12/22/18 1610         History   No chief complaint on file.    The history is provided by the patient and the spouse.   Cough   Cough characteristics:  Productive  Sputum characteristics:  Yellow  Severity:  Moderate  Onset quality:  Gradual  Duration:  5 days  Timing:  Intermittent  Progression:  Worsening  Chronicity:  New  Smoker: no    Context: sick contacts, upper respiratory infection and weather changes    Relieved by:  Nothing  Worsened by:  Nothing  Ineffective treatments:  None tried  Associated symptoms: sinus congestion    Associated symptoms: no chest pain, no chills, no fever, no headaches, no rash, no rhinorrhea, no shortness of breath, no sore throat and no wheezing    Risk factors: recent infection     58 year female with dx of strep on 3/1, seen on 3/4 and dx with flu along with 7 other family members. She took one day of tamiflu and then stopped. Cough has been for about 2 weeks, was green and thick and now is yellow and thin. Was seen by pcp on 3/9 and put on levaquin for bacterial infection and has been taking bid as opposed to qd as prescribed and also put on prednisone daily. No noted fever, mild congested, no rhinorrhea, no sore throat, no chest pain or sob. Noted more coughing and congestion last 2 days and felt bad this am. No leg pain or edema. Just feels congested now.   No corona exposure and no travel to corona areas.     PCP Megan Salon   Past Medical History:   Diagnosis Date   . Anxiety    . Arthritis     Neck Right Hand   . Asthma without status asthmaticus    . Claustrophobia    . Depression    . Gastroesophageal reflux disease    . Headache(784.0)     Sinus   . Irritable bowel syndrome    . Low back pain    . MTHFR (methylene THF reductase) deficiency and homocystinuria    . Other plastic surgery for unacceptable cosmetic appearance    . Sciatica of left side    . Seasonal allergies    . Sleep apnea    . Smokes with greater than  40 pack year history     Quit 2 weks ago.   LMP hyst     Past Surgical History:   Procedure Laterality Date   . ABDOMINOPLASTY, LIPOSUCTION (COSMETIC)  2009   . CYSTOSCOPY, URETEROSCOPY  2015   . EGD, COLONOSCOPY  2015   . FACELIFT, (RHYTIDECTOMY) (COSMETIC)  02/06/2014    Procedure: FACELIFT, (RHYTIDECTOMY) (COSMETIC);  Surgeon: Katheren Puller, MD;  Location: [facility] OR;  Service: Plastics;  Laterality: Bilateral;  RHYTIDECTOMY (COSMETIC)    . HYSTEROSCOPY, ENDOMETRIAL ABLATION, THERMACHOICE  2015   . LIPOSUCTION, NECK (COSMETIC)  2009       Family History   Problem Relation Age of Onset   . Heart disease Mother    . Heart disease Father    . Cancer Maternal Grandfather    . Anesthesia problems Sister        Social  Social History     Tobacco Use   . Smoking status: Current Every Day Smoker     Packs/day: 0.00     Years: 40.00     Pack years: 0.00  Types: Cigarettes   . Smokeless tobacco: Never Used   Substance Use Topics   . Alcohol use: No     Alcohol/week: 0.0 standard drinks   . Drug use: No   .     No Known Allergies    Home Medications     Med List Status:  In Progress Set By: Jacquelin Hawking, RN at 12/22/2018  6:21 AM                Cholecalciferol (VITAMIN D) 400 units/mL liquid (NICU)     Take by mouth daily     esomeprazole (NEXIUM) 40 MG capsule     Take 40 mg by mouth every morning before breakfast.     estradiol (VIVELLE-DOT) 0.05 MG/24HR     Place 1 patch onto the skin twice a week     estradiol-norethindrone (COMBIPATCH) 0.05-0.14 MG/DAY          levoFLOXacin (LEVAQUIN) 500 MG tablet     Take 500 mg by mouth daily     Magnesium 300 MG Cap     Take by mouth     Probiotic Product (PROBIOTIC DAILY PO)     Take 2 tablets by mouth daily.           Flagged for Removal             buPROPion XL (WELLBUTRIN XL) 150 MG 24 hr tablet     TAKE ONE TABLET BY MOUTH EVERY DAY     DULoxetine (CYMBALTA) 30 MG capsule     Take 20 mg by mouth daily        Hormone Cream Base Cream     by Does not apply  route.Hormone patch     ibuprofen (ADVIL,MOTRIN) 200 MG tablet     Take 600 mg by mouth every 6 (six) hours as needed for Pain           Review of Systems   Constitutional: Negative for chills and fever.   HENT: Positive for congestion. Negative for rhinorrhea and sore throat.    Respiratory: Positive for cough. Negative for shortness of breath and wheezing.    Cardiovascular: Negative for chest pain.   Gastrointestinal: Negative for abdominal pain, nausea and vomiting.   Musculoskeletal: Negative for back pain, gait problem and neck pain.   Skin: Negative for rash.   Neurological: Negative for weakness, light-headedness, numbness and headaches.       Physical Exam    BP: 109/82, Heart Rate: 75, Temp: 98.8 F (37.1 C), Resp Rate: 22, SpO2: 100 %    Physical Exam  Vitals signs and nursing note reviewed.   Constitutional:       General: She is not in acute distress.     Appearance: Normal appearance. She is not ill-appearing.   HENT:      Head: Normocephalic and atraumatic.      Right Ear: Tympanic membrane, ear canal and external ear normal.      Left Ear: Tympanic membrane, ear canal and external ear normal.      Nose: Nose normal. No congestion or rhinorrhea.      Mouth/Throat:      Mouth: Mucous membranes are moist.      Pharynx: No oropharyngeal exudate or posterior oropharyngeal erythema.   Eyes:      Extraocular Movements: Extraocular movements intact.      Pupils: Pupils are equal, round, and reactive to light.   Neck:      Musculoskeletal: Full passive  range of motion without pain and normal range of motion. No neck rigidity or muscular tenderness.   Cardiovascular:      Rate and Rhythm: Normal rate and regular rhythm.      Heart sounds: No murmur.   Pulmonary:      Effort: Pulmonary effort is normal. No respiratory distress.      Breath sounds: Examination of the right-lower field reveals rhonchi. Examination of the left-lower field reveals rhonchi. Rhonchi present. No wheezing.      Comments: Congested  cough noted, pox 100 room air   Abdominal:      General: Abdomen is flat. Bowel sounds are normal. There is no distension.      Tenderness: There is no abdominal tenderness.   Musculoskeletal: Normal range of motion.         General: No swelling or tenderness.      Right lower leg: She exhibits no tenderness. No edema.      Left lower leg: She exhibits no tenderness. No edema.   Skin:     General: Skin is warm and dry.      Capillary Refill: Capillary refill takes less than 2 seconds.      Findings: No rash.   Neurological:      General: No focal deficit present.      Mental Status: She is alert and oriented to person, place, and time. Mental status is at baseline.      Cranial Nerves: Cranial nerves are intact. No cranial nerve deficit.      Sensory: Sensation is intact. No sensory deficit.      Motor: Motor function is intact. No weakness.      Coordination: Coordination is intact. Coordination normal.      Gait: Gait is intact. Gait normal.   Psychiatric:         Mood and Affect: Mood normal.         Behavior: Behavior normal.           MDM and ED Course     ED Medication Orders (From admission, onward)    Start Ordered     Status Ordering Provider    12/22/18 0703 12/22/18 0702  albuterol (PROVENTIL HFA;VENTOLIN HFA) inhaler 2 puff  RT - Once     Route: Inhalation  Ordered Dose: 2 puff     Last MAR action:  Given Reginia Forts    12/22/18 5409 12/22/18 0626  albuterol (PROVENTIL) (2.5 MG/3ML) 0.083% nebulizer solution 2.5 mg  RT - Once     Route: Nebulization  Ordered Dose: 2.5 mg     Last MAR action:  Given Endiya Klahr J             MDM  Number of Diagnoses or Management Options  Bronchitis:   Cough:   Tobacco use:   Diagnosis management comments: Dr. Marlane Hatcher  is the primary attending for this patient and has obtained and performed the history, PE, and medical decision making for this patient.    Oxygen saturation by pulse oximetry is 95%-100%, Normal.  Interventions: None Needed and Patient Observed.     Recent flu and strep, will check chest x ray and neb, no labs indicated at this time, already on prednisone     Results     ** No results found for the last 24 hours. **        Radiology Results (24 Hour)     Procedure Component Value Units Date/Time    XR  Chest 2 Views ([id number]) Collected:  12/22/18 0705    Order Status:  Completed Updated:  12/22/18 0711    Narrative:       HISTORY:  Cough    COMPARISON: 09/03/2017    FINDINGS:  PA and lateral views of the chest were obtained The cardiac  silhouette, mediastinum, and pulmonary vasculature are normal. A  calcified granuloma projects of the left upper lobe. There is minimal  scarring at the right upper lobe. No infiltrate or pleural effusion is  identified. Hila are unremarkable.      Impression:        Negative for acute process    Gerlene Burdock, MD   12/22/2018 7:07 AM      Breath sounds clear on re check, discussed with patient at length about stopping smoking, use of albuterol with spacer on a regular basis and to continue levaquin but only once daily for 5 more days.   Use of mucinex recommended                  Amount and/or Complexity of Data Reviewed  Tests in the radiology section of CPT: reviewed and ordered    Patient Progress  Patient progress: improved                   Procedures    Clinical Impression & Disposition     Clinical Impression  Final diagnoses:   Cough   Bronchitis   Tobacco use        ED Disposition     ED Disposition Condition Date/Time Comment    Discharge  Sat Dec 22, 2018  7:20 AM Doralee Albino Hurlbut discharge to home/self care.    Condition at disposition: Stable           New Prescriptions    LEVOFLOXACIN (LEVAQUIN) 500 MG TABLET    Take 1 tablet (500 mg total) by mouth daily for 5 days    SPACER/AERO-HOLD CHAMBER BAGS MISC    Use with inhaler 2 puffs every 6 hours for one week                 Jakelyn Squyres, Lindalou Hose, MD  12/22/18 [phone]

## 2019-05-13 ENCOUNTER — Other Ambulatory Visit: Payer: Self-pay | Admitting: Internal Medicine

## 2019-06-21 ENCOUNTER — Other Ambulatory Visit: Payer: Self-pay

## 2019-07-01 ENCOUNTER — Emergency Department
Admission: EM | Admit: 2019-07-01 | Discharge: 2019-07-01 | Disposition: A | Discharge status: Home / Self Care | Attending: Emergency Medicine | Admitting: Emergency Medicine

## 2019-07-01 ENCOUNTER — Ambulatory Visit (FREE_STANDING_LABORATORY_FACILITY)

## 2019-07-01 ENCOUNTER — Emergency Department

## 2019-07-01 DIAGNOSIS — Z1159 Encounter for screening for other viral diseases: Secondary | ICD-10-CM

## 2019-07-01 DIAGNOSIS — R079 Chest pain, unspecified: Secondary | ICD-10-CM | POA: Insufficient documentation

## 2019-07-01 DIAGNOSIS — M546 Pain in thoracic spine: Secondary | ICD-10-CM | POA: Insufficient documentation

## 2019-07-01 DIAGNOSIS — Z01818 Encounter for other preprocedural examination: Secondary | ICD-10-CM

## 2019-07-01 LAB — COMPREHENSIVE METABOLIC PANEL
ALT: 25 U/L (ref 0–55)
AST (SGOT): 21 U/L (ref 5–34)
Albumin/Globulin Ratio: 1.4 (ref 0.9–2.2)
Albumin: 3.7 g/dL (ref 3.5–5.0)
Alkaline Phosphatase: 59 U/L (ref 37–106)
Anion Gap: 8 (ref 5.0–15.0)
BUN: 9 mg/dL (ref 7.0–19.0)
Bilirubin, Total: 0.4 mg/dL (ref 0.2–1.2)
CO2: 24 mEq/L (ref 22–29)
Calcium: 8.9 mg/dL (ref 8.5–10.5)
Chloride: 105 mEq/L (ref 100–111)
Creatinine: 0.9 mg/dL (ref 0.6–1.0)
Globulin: 2.6 g/dL (ref 2.0–3.6)
Glucose: 119 mg/dL — ABNORMAL HIGH (ref 70–100)
Potassium: 3.9 mEq/L (ref 3.5–5.1)
Protein, Total: 6.3 g/dL (ref 6.0–8.3)
Sodium: 137 mEq/L (ref 136–145)

## 2019-07-01 LAB — PT AND APTT
PT INR: 1 (ref 0.9–1.1)
PT: 13.1 s (ref 12.6–15.0)
PTT: 30 s (ref 23–37)

## 2019-07-01 LAB — CBC AND DIFFERENTIAL
Absolute NRBC: 0 10*3/uL (ref 0.00–0.00)
Basophils Absolute Automated: 0.04 10*3/uL (ref 0.00–0.08)
Basophils Automated: 0.6 %
Eosinophils Absolute Automated: 0.19 10*3/uL (ref 0.00–0.44)
Eosinophils Automated: 3.1 %
Hematocrit: 40.1 % (ref 34.7–43.7)
Hgb: 13.6 g/dL (ref 11.4–14.8)
Immature Granulocytes Absolute: 0.01 10*3/uL (ref 0.00–0.07)
Immature Granulocytes: 0.2 %
Lymphocytes Absolute Automated: 2.01 10*3/uL (ref 0.42–3.22)
Lymphocytes Automated: 32.6 %
MCH: 31.1 pg (ref 25.1–33.5)
MCHC: 33.9 g/dL (ref 31.5–35.8)
MCV: 91.8 fL (ref 78.0–96.0)
MPV: 9.8 fL (ref 8.9–12.5)
Monocytes Absolute Automated: 0.42 10*3/uL (ref 0.21–0.85)
Monocytes: 6.8 %
Neutrophils Absolute: 3.49 10*3/uL (ref 1.10–6.33)
Neutrophils: 56.7 %
Nucleated RBC: 0 /100 WBC (ref 0.0–0.0)
Platelets: 210 10*3/uL (ref 142–346)
RBC: 4.37 10*6/uL (ref 3.90–5.10)
RDW: 13 % (ref 11–15)
WBC: 6.16 10*3/uL (ref 3.10–9.50)

## 2019-07-01 LAB — GFR: EGFR: >60

## 2019-07-01 LAB — MAGNESIUM: Magnesium: 1.9 mg/dL (ref 1.6–2.6)

## 2019-07-01 LAB — IHS D-DIMER: D-Dimer: 0.56 ug/mL FEU (ref 0.00–0.70)

## 2019-07-01 LAB — TROPONIN I: Troponin I: 0.01 ng/mL (ref 0.00–0.05)

## 2019-07-01 MED ORDER — LIDOCAINE 5 % EX PTCH
1.00 | MEDICATED_PATCH | CUTANEOUS | 0 refills | Status: DC
Start: 2019-07-01 — End: 2022-10-16

## 2019-07-01 MED ORDER — SODIUM CHLORIDE 0.9 % IV BOLUS
1000.00 mL | Freq: Once | INTRAVENOUS | Status: AC
Start: 2019-07-01 — End: 2019-07-01
  Administered 2019-07-01: 17:00:00 1000 mL via INTRAVENOUS

## 2019-07-01 MED ORDER — FENTANYL CITRATE (PF) 50 MCG/ML IJ SOLN (WRAP)
50.00 ug | Freq: Once | INTRAMUSCULAR | Status: AC
Start: 2019-07-01 — End: 2019-07-01
  Administered 2019-07-01: 17:00:00 50 ug via INTRAVENOUS
  Filled 2019-07-01: qty 2

## 2019-07-01 MED ORDER — ONDANSETRON HCL 4 MG/2ML IJ SOLN
4.00 mg | Freq: Once | INTRAMUSCULAR | Status: AC
Start: 2019-07-01 — End: 2019-07-01
  Administered 2019-07-01: 17:00:00 4 mg via INTRAVENOUS
  Filled 2019-07-01: qty 2

## 2019-07-01 MED ORDER — LIDOCAINE 5 % EX PTCH
1.00 | MEDICATED_PATCH | Freq: Once | CUTANEOUS | Status: DC
Start: 2019-07-01 — End: 2019-07-01
  Administered 2019-07-01: 19:00:00 1 via TRANSDERMAL
  Filled 2019-07-01: qty 1

## 2019-07-01 MED ORDER — CYCLOBENZAPRINE HCL 10 MG PO TABS
10.0000 mg | ORAL_TABLET | Freq: Three times a day (TID) | ORAL | 0 refills | Status: DC | PRN
Start: 2019-07-01 — End: 2021-02-16

## 2019-07-01 MED ORDER — CYCLOBENZAPRINE HCL 10 MG PO TABS
10.00 mg | ORAL_TABLET | Freq: Once | ORAL | Status: AC
Start: 2019-07-01 — End: 2019-07-01
  Administered 2019-07-01: 19:00:00 10 mg via ORAL
  Filled 2019-07-01: qty 1

## 2019-07-01 NOTE — Discharge Instructions (Signed)
Back Pain NOS    You have been seen for back pain.    Back pain can happen anywhere from the neck down to the low back. Back pain has many different causes. Some of the more common are: Bone pain, muscle strain, muscle spasm, pain from overuse, and pinched nerves. Other problems can cause what feels like back pain. But the pain is really coming from another organ. A kidney infection can cause lower back pain.    The x-rays of your back showed no broken bones.    The doctor still does not know the exact cause of your pain. Your problem does not seem to be from a dangerous cause. It is OK for you to go home today.    Some things you can try to help your back feel better are:   Apply a warm damp washcloth to the back where you have pain for 20 minutes at a time, at least 4 times per day.   Have someone massage the sore parts of your back.   Don't do any heavy lifting or bending. You can go back to normal daily activities if they don't make the pain worse.   You can use anti-inflammatory pain medicine for your pain. This could be Ibuprofen (Advil or Motrin). You can buy these at most stores. Follow the directions on the package.    This pain may last for the next few days.     Call your doctor or go to the nearest Emergency Department if you your pain does not improve within 4 weeks or your pain is bad enough to seriously limit your normal activities.    YOU SHOULD SEEK MEDICAL ATTENTION IMMEDIATELY, EITHER HERE OR AT THE NEAREST EMERGENCY DEPARTMENT, IF ANY OF THE FOLLOWING OCCURS:   You think the pain is coming from somewhere other than your back. This can include chest pain. This is sometimes from angina (heart pains) or other dangerous causes.   You have shortness of breath, sweating, chest pain (or pressure, heaviness, indigestion, etc).   You have abdominal (belly) pain that goes through to your back.   Your arms and legs tingle or get numb (lose feeling).   Your arms or legs are weak.   You  lose control of your bladder or bowels. If this were to happen, it may cause you to wet or soil yourself.   You have problems urinating (peeing).   You have fever (temperature higher than 100.88F / 38C).   Your pain gets worse.   Your symptoms get worse or you have new symptoms or concerns.    If you can't follow up with your doctor, or if at any time you feel you need to be rechecked or seen again, come back here or go to the nearest emergency department.               Chest Pain of Unclear Etiology    You have been seen for chest pain. The cause of your pain is not yet known.    Your doctor has learned about your medical history, examined you, and checked any tests that were done. Still, it is not clear why you are having pain. The doctor thinks there is only a small chance that your pain is caused by a health problem that could lead to serious harm or death. Later, your primary care doctor might do more tests or check you again.    Sometimes chest pain is caused by a health problem that  can lead to death, like a:   Heart attack.   Injury to the large blood vessel in your body (aorta).   Blood clot in the lung.   Collapsed lung.     It is not likely that your pain is caused by a health problem that could lead to death if:    Your chest pain lasts only a few seconds at a time   You are not short of breath, nauseated (sick to your stomach), sweaty, or lightheaded   Your pain gets worse when you twist or bend   Your pain improves with exercise or hard work.    Chest pain is serious. It is very important that you follow up with your regular doctor.    Return here or go to the nearest Emergency Department immediately if:   Your pain makes you short of breath, nauseated (sick to your stomach), or sweaty.   Your pain gets worse when you walk, go up stairs, or exert yourself.   You feel weak, lightheaded, or faint.   It hurts to breathe.   Your leg swells.   Your pain or symptoms get worse     You have new symptoms or concerns.    If you can't follow up with your doctor, or if at any time you feel you need to be rechecked or seen again, come back here or go to the nearest emergency department.

## 2019-07-01 NOTE — ED Triage Notes (Addendum)
Pt arrived to ER with husband. C/o pain in left shoulder blade radiating through to chest. Pt reports that she has had pain since December. Pain has been worse since yesterday. Pt reports pain takes her breath away.

## 2019-07-01 NOTE — ED Provider Notes (Signed)
History     Chief Complaint   Patient presents with    Back Pain    Chest Pain     Patient is a 58 year old female history of anxiety, arthritis, asthma,  MTHFR, etc., who comes to ER c/o reproducible L upper parathoracic back pain radiating into L shoulder and L anterior chest on/off since December 2019, worse in last few days. States no trauma or falls. No cough or shortness of breath. "pain takes her breath aware when its bad"  States she has been to pain management physician regarding this pain but "nothing is helping".  No fever or chills.   Worse with movement and direct palpation of the area, pain more pronounced in L trapezius area per pt.   Pt states pain is 8/10 in intensity, throbbing, sharp  Cardiology is Dr. Alanson Aly      The history is provided by the patient.        Past Medical History:   Diagnosis Date    Anxiety     Arthritis     Neck Right Hand    Asthma without status asthmaticus     Claustrophobia     Depression     Gastroesophageal reflux disease     Headache(784.0)     Sinus    Irritable bowel syndrome     Low back pain     MTHFR (methylene THF reductase) deficiency and homocystinuria     Other plastic surgery for unacceptable cosmetic appearance     Sciatica of left side     Seasonal allergies     Sleep apnea     Smokes with greater than 40 pack year history     Quit 2 weks ago.       Past Surgical History:   Procedure Laterality Date    ABDOMINOPLASTY, LIPOSUCTION (COSMETIC)  2009    CYSTOSCOPY, URETEROSCOPY  2015    EGD, COLONOSCOPY  2015    FACELIFT, (RHYTIDECTOMY) (COSMETIC)  02/06/2014    Procedure: FACELIFT, (RHYTIDECTOMY) (COSMETIC);  Surgeon: Katheren Puller, MD;  Location: [facility] OR;  Service: Plastics;  Laterality: Bilateral;  RHYTIDECTOMY (COSMETIC)     HYSTEROSCOPY, ENDOMETRIAL ABLATION, THERMACHOICE  2015    LIPOSUCTION, NECK (COSMETIC)  2009       Family History   Problem Relation Age of Onset    Heart disease Mother     Heart disease Father      Cancer Maternal Grandfather     Anesthesia problems Sister        Social  Social History     Tobacco Use    Smoking status: Current Every Day Smoker     Packs/day: 0.00     Years: 40.00     Pack years: 0.00     Types: Cigarettes    Smokeless tobacco: Never Used   Substance Use Topics    Alcohol use: No     Alcohol/week: 0.0 standard drinks    Drug use: No       .     No Known Allergies    Home Medications             Cholecalciferol (VITAMIN D) 400 units/mL liquid (NICU)     Take by mouth daily     esomeprazole (NEXIUM) 40 MG capsule     Take 40 mg by mouth every morning before breakfast.     estradiol (VIVELLE-DOT) 0.05 MG/24HR     Place 1 patch onto the skin twice a week  estradiol-norethindrone (COMBIPATCH) 0.05-0.14 MG/DAY          levoFLOXacin (LEVAQUIN) 500 MG tablet     Take 500 mg by mouth daily     Magnesium 300 MG Cap     Take by mouth     Probiotic Product (PROBIOTIC DAILY PO)     Take 2 tablets by mouth daily.     Spacer/Aero-Hold Chamber [employer]     Use with inhaler 2 puffs every 6 hours for one week           Flagged for Removal             buPROPion XL (WELLBUTRIN XL) 150 MG 24 hr tablet     TAKE ONE TABLET BY MOUTH EVERY DAY     DULoxetine (CYMBALTA) 30 MG capsule     Take 20 mg by mouth daily        Hormone Cream Base Cream     by Does not apply route.Hormone patch     ibuprofen (ADVIL,MOTRIN) 200 MG tablet     Take 600 mg by mouth every 6 (six) hours as needed for Pain           Review of Systems   Constitutional: Negative for chills and fever.   HENT: Negative for sore throat.    Respiratory: Negative for cough.    Cardiovascular: Positive for chest pain.   Gastrointestinal: Negative for abdominal pain, nausea and vomiting.   Genitourinary: Negative.    Musculoskeletal: Positive for back pain.   Skin: Negative for rash.   Neurological: Negative for weakness and numbness.   Psychiatric/Behavioral: The patient is nervous/anxious.    All other systems reviewed and are negative.       Physical Exam    BP: 126/88, Heart Rate: 88, Temp: 97.8 F (36.6 C), Resp Rate: 22, SpO2: 100 %    Physical Exam  Vitals signs and nursing note reviewed.   Constitutional:       General: She is in acute distress.      Appearance: She is well-developed. She is not diaphoretic.   HENT:      Head: Normocephalic and atraumatic.      Right Ear: External ear normal.      Left Ear: External ear normal.      Nose: Nose normal.   Eyes:      Conjunctiva/sclera: Conjunctivae normal.      Pupils: Pupils are equal, round, and reactive to light.   Neck:      Musculoskeletal: Normal range of motion and neck supple.   Cardiovascular:      Rate and Rhythm: Normal rate and regular rhythm.      Heart sounds: Normal heart sounds. No murmur.   Pulmonary:      Effort: Pulmonary effort is normal. No tachypnea.      Breath sounds: Normal breath sounds. No decreased breath sounds.   Abdominal:      General: Bowel sounds are normal.      Palpations: Abdomen is soft.      Tenderness: There is no abdominal tenderness.   Musculoskeletal: Normal range of motion.        Back:    Skin:     General: Skin is warm and dry.      Capillary Refill: Capillary refill takes less than 2 seconds.   Neurological:      General: No focal deficit present.      Mental Status: She is alert and oriented to person,  place, and time.      Cranial Nerves: No cranial nerve deficit.      Coordination: Coordination normal.      Deep Tendon Reflexes: Reflexes are normal and symmetric.   Psychiatric:         Mood and Affect: Mood is anxious.         Behavior: Behavior normal.         Thought Content: Thought content normal.         Judgment: Judgment normal.           MDM and ED Course     ED Medication Orders (From admission, onward)    Start Ordered     Status Ordering Provider    07/01/19 1859 07/01/19 1858  cyclobenzaprine (FLEXERIL) tablet 10 mg  Once     Route: Oral  Ordered Dose: 10 mg     Last MAR action: Given [name]    07/01/19 1859 07/01/19 1858     Once     Route: Transdermal  Ordered Dose: 1 patch     Discontinued Kazimierz Springborn J    07/01/19 1644 07/01/19 1643  sodium chloride 0.9 % bolus 1,000 mL  Once     Route: Intravenous  Ordered Dose: 1,000 mL     Last MAR action: Stopped Phyllis Ginger J    07/01/19 1644 07/01/19 1643  fentaNYL (PF) (SUBLIMAZE) injection 50 mcg  Once     Route: Intravenous  Ordered Dose: 50 mcg     Last MAR action: Given Daisja Kessinger J    07/01/19 1644 07/01/19 1643  ondansetron (ZOFRAN) injection 4 mg  Once     Route: Intravenous  Ordered Dose: 4 mg     Last MAR action: Given Annick Dimaio J             MDM  Number of Diagnoses or Management Options  Acute left-sided thoracic back pain:   Chest pain, unspecified type:   Diagnosis management comments: I, Phyllis Ginger PA-C, have been the primary provider for Monica Turner during this Emergency Dept visit.    The attending signature signifies review and agreement of the history, physical examination, evaluation, clinical impression and plan except as noted.     Oxygen saturation by pulse oximetry is 95%-100%, Normal.  Interventions: None Needed and Patient Observed.    This chart was generated by the Epic EMR system/speech recognition and may contain inherent errors or omissions not intended by the user. Grammatical errors, random word insertions, deletions, pronoun errors and incomplete sentences are occasional consequences of this technology due to software limitations. Not all errors are caught or corrected. If there are questions or concerns about the content of this note or information contained within the body of this dictation they should be addressed directly with the author for clarification    When I was within 6 feet of this patient I donned the following PPE:  Surgical Mask Yes, Gloves Yes, Gown No  ; Goggles No  ; Face Shield Yes, N95 No  .  The patient was wearing a mask during my evaluation Yes.    EKG Interpretation    Rate: Normal  Rhythm: sinus rhythm  Axis:  Normal  ST-T Segments: no ST Elevation , low voltage QRS   Conduction: No blocks  Impression: Non-specific EKG    EKG interpreted by ER MD    Xr Chest 2 Views    Result Date: 07/01/2019   No acute abnormality. Bosie Helper,  MD  07/01/2019 6:23 PM    6:58 PM  Likely MSk pain given presentation, pt will follow up with her cardiologist as well as spine ortho for further managment. Referral provided.     Pt was updated on results of lab and rad studies. Pt clinically well appearing, improved post tx in ER. All questions answered and pt presented with good understanding.  Advised to come back to ER if feeling worse or any other concerns. Close follow up as per [place] instructions. Take Rx medication as prescribed. Pt agrees with plan.         Amount and/or Complexity of Data Reviewed  Clinical lab tests: ordered and reviewed  Tests in the radiology section of CPT: ordered and reviewed      Results     Procedure Component Value Units Date/Time    Troponin I [[id number]] Collected: 07/01/19 1704    Specimen: Blood Updated: 07/01/19 1740     Troponin I 0.01 ng/mL     Comprehensive metabolic panel [[id number]]  (Abnormal) Collected: 07/01/19 1704    Specimen: Blood Updated: 07/01/19 1734     Glucose 119 mg/dL      BUN 9.0 mg/dL      Creatinine 0.9 mg/dL      Sodium 782 mEq/L      Potassium 3.9 mEq/L      Chloride 105 mEq/L      CO2 24 mEq/L      Calcium 8.9 mg/dL      Protein, Total 6.3 g/dL      Albumin 3.7 g/dL      AST (SGOT) 21 U/L      ALT 25 U/L      Alkaline Phosphatase 59 U/L      Bilirubin, Total 0.4 mg/dL      Globulin 2.6 g/dL      Albumin/Globulin Ratio 1.4     Anion Gap 8.0    Magnesium [[id number]] Collected: 07/01/19 1704    Specimen: Blood Updated: 07/01/19 1734     Magnesium 1.9 mg/dL     GFR [[id number]] Collected: 07/01/19 1704     Updated: 07/01/19 1734     EGFR >60.0    PT/APTT [[id number]] Collected: 07/01/19 1704     Updated: 07/01/19 1734     PT 13.1 sec      PT INR 1.0     PTT 30 sec     D-Dimer [[id number]]  Collected: 07/01/19 1704     Updated: 07/01/19 1733     D-Dimer 0.56 ug/mL FEU     CBC and differential [[id number]] Collected: 07/01/19 1704    Specimen: Blood Updated: 07/01/19 1713     WBC 6.16 x10 3/uL      Hgb 13.6 g/dL      Hematocrit 59.5 %      Platelets 210 x10 3/uL      RBC 4.37 x10 6/uL      MCV 91.8 fL      MCH 31.1 pg      MCHC 33.9 g/dL      RDW 13 %      MPV 9.8 fL      Neutrophils 56.7 %      Lymphocytes Automated 32.6 %      Monocytes 6.8 %      Eosinophils Automated 3.1 %      Basophils Automated 0.6 %      Immature Granulocytes 0.2 %      Nucleated RBC 0.0 /100 WBC  Neutrophils Absolute 3.49 x10 3/uL      Lymphocytes Absolute Automated 2.01 x10 3/uL      Monocytes Absolute Automated 0.42 x10 3/uL      Eosinophils Absolute Automated 0.19 x10 3/uL      Basophils Absolute Automated 0.04 x10 3/uL      Immature Granulocytes Absolute 0.01 x10 3/uL      Absolute NRBC 0.00 x10 3/uL                        Procedures    Clinical Impression & Disposition     Clinical Impression  Final diagnoses:   Acute left-sided thoracic back pain   Chest pain, unspecified type        ED Disposition     ED Disposition Condition Date/Time Comment    Discharge  Mon Jul 01, 2019  6:59 PM Monica Turner discharge to home/self care.    Condition at disposition: Stable           Discharge Medication List as of 07/01/2019  7:00 PM      START taking these medications    Details   cyclobenzaprine (FLEXERIL) 10 MG tablet Take 1 tablet (10 mg total) by mouth 3 (three) times daily as needed for Muscle spasms, Starting Mon 07/01/2019, E-Rx      lidocaine (LIDODERM) 5 % Place 1 patch onto the skin every 24 hours Remove & Discard patch within 12 hours or as directed by MD, Starting Mon 07/01/2019, E-Rx                       [name], PA  07/03/19 2046       Melvern Sample, DO  07/03/19 2118

## 2019-07-01 NOTE — ED Notes (Signed)
Family at bedside. 

## 2019-07-02 LAB — ECG 12-LEAD
Atrial Rate: 75 {beats}/min
P Axis: 88 degrees
P-R Interval: 152 ms
Q-T Interval: 368 ms
QRS Duration: 72 ms
QTC Calculation (Bezet): 410 ms
R Axis: 18 degrees
T Axis: 46 degrees
Ventricular Rate: 75 {beats}/min

## 2019-07-03 LAB — COVID-19 (SARS-COV-2): SARS CoV 2 Overall Result: NOT DETECTED

## 2019-07-03 NOTE — ED Notes (Signed)
[name], DO, have personally seen and examined this patient, and have fully participated in her care.  I agree with all pertinent and available clinical information, including history, physical examination, clinical impression, assessment, and plan as documented by the [facility] except as noted.    Addition physical exam by me: 58yof w/ Chest and back pain. PE: reproducible pain w/ ROM. Lungs clear and Heart RRR. Abd soft and non-tender.       Monica Sample, DO  07/03/19 1413

## 2019-07-04 ENCOUNTER — Ambulatory Visit: Payer: Self-pay

## 2019-07-04 ENCOUNTER — Other Ambulatory Visit
Admission: RE | Admit: 2019-07-04 | Discharge: 2019-07-04 | Disposition: A | Discharge status: Home / Self Care | Source: Ambulatory Visit | Attending: Gastroenterology | Admitting: Gastroenterology

## 2019-07-04 DIAGNOSIS — Z86010 Personal history of colonic polyps: Secondary | ICD-10-CM | POA: Insufficient documentation

## 2019-07-05 DIAGNOSIS — D128 Benign neoplasm of rectum: Secondary | ICD-10-CM

## 2019-07-05 DIAGNOSIS — D125 Benign neoplasm of sigmoid colon: Secondary | ICD-10-CM

## 2019-07-09 LAB — LAB USE ONLY - HISTORICAL SURGICAL PATHOLOGY

## 2019-07-19 ENCOUNTER — Other Ambulatory Visit: Payer: Self-pay | Admitting: Internal Medicine

## 2020-01-08 ENCOUNTER — Other Ambulatory Visit: Payer: Self-pay | Admitting: Internal Medicine

## 2020-03-10 ENCOUNTER — Other Ambulatory Visit: Payer: Self-pay | Admitting: Internal Medicine

## 2020-11-02 ENCOUNTER — Other Ambulatory Visit: Payer: Self-pay

## 2020-11-11 ENCOUNTER — Emergency Department
Admission: EM | Admit: 2020-11-11 | Discharge: 2020-11-11 | Disposition: A | Discharge status: Home / Self Care | Attending: Emergency Medicine | Admitting: Emergency Medicine

## 2020-11-11 DIAGNOSIS — R1012 Left upper quadrant pain: Secondary | ICD-10-CM | POA: Insufficient documentation

## 2020-11-11 DIAGNOSIS — R079 Chest pain, unspecified: Secondary | ICD-10-CM

## 2020-11-11 LAB — URINALYSIS REFLEX TO MICROSCOPIC EXAM - REFLEX TO CULTURE
Bilirubin, UA: NEGATIVE
Glucose, UA: NEGATIVE
Ketones UA: NEGATIVE
Leukocyte Esterase, UA: NEGATIVE
Nitrite, UA: NEGATIVE
Protein, UR: NEGATIVE
RBC, UA: 0 /hpf (ref 0–5)
Specific Gravity UA: 1.002 (ref 1.001–1.035)
Urine pH: 6.5 (ref 5.0–8.0)
Urobilinogen, UA: 0.2 mg/dL (ref 0.2–2.0)

## 2020-11-11 LAB — CBC AND DIFFERENTIAL
Absolute NRBC: 0 10*3/uL (ref 0.00–0.00)
Basophils Absolute Automated: 0.05 10*3/uL (ref 0.00–0.08)
Basophils Automated: 0.6 %
Eosinophils Absolute Automated: 0.13 10*3/uL (ref 0.00–0.44)
Eosinophils Automated: 1.5 %
Hematocrit: 38.4 % (ref 34.7–43.7)
Hgb: 13.1 g/dL (ref 11.4–14.8)
Immature Granulocytes Absolute: 0.02 10*3/uL (ref 0.00–0.07)
Immature Granulocytes: 0.2 %
Lymphocytes Absolute Automated: 2.37 10*3/uL (ref 0.42–3.22)
Lymphocytes Automated: 27.8 %
MCH: 31.4 pg (ref 25.1–33.5)
MCHC: 34.1 g/dL (ref 31.5–35.8)
MCV: 92.1 fL (ref 78.0–96.0)
MPV: 10.6 fL (ref 8.9–12.5)
Monocytes Absolute Automated: 0.61 10*3/uL (ref 0.21–0.85)
Monocytes: 7.1 %
Neutrophils Absolute: 5.36 10*3/uL (ref 1.10–6.33)
Neutrophils: 62.8 %
Nucleated RBC: 0 /100 WBC (ref 0.0–0.0)
Platelets: 243 10*3/uL (ref 142–346)
RBC: 4.17 10*6/uL (ref 3.90–5.10)
RDW: 13 % (ref 11–15)
WBC: 8.54 10*3/uL (ref 3.10–9.50)

## 2020-11-11 LAB — COMPREHENSIVE METABOLIC PANEL
ALT: 13 U/L (ref 0–55)
AST (SGOT): 12 U/L (ref 5–34)
Albumin/Globulin Ratio: 1.5 (ref 0.9–2.2)
Albumin: 3.8 g/dL (ref 3.5–5.0)
Alkaline Phosphatase: 53 U/L (ref 37–106)
Anion Gap: 8 (ref 5.0–15.0)
BUN: 16 mg/dL (ref 7.0–19.0)
Bilirubin, Total: 0.3 mg/dL (ref 0.2–1.2)
CO2: 24 mEq/L (ref 22–29)
Calcium: 8.8 mg/dL (ref 8.5–10.5)
Chloride: 102 mEq/L (ref 100–111)
Creatinine: 0.9 mg/dL (ref 0.6–1.0)
Globulin: 2.5 g/dL (ref 2.0–3.6)
Glucose: 80 mg/dL (ref 70–100)
Potassium: 4.1 mEq/L (ref 3.5–5.1)
Protein, Total: 6.3 g/dL (ref 6.0–8.3)
Sodium: 134 mEq/L — ABNORMAL LOW (ref 136–145)

## 2020-11-11 LAB — GFR: EGFR: >60

## 2020-11-11 LAB — TROPONIN I: Troponin I: <0.01 ng/mL (ref 0.00–0.05)

## 2020-11-11 LAB — LIPASE: Lipase: 11 U/L (ref 8–78)

## 2020-11-11 NOTE — Discharge Instructions (Signed)
YOU MAY SEE THE DOCTOR LISTED IF YOUR DOCTOR IS NOT AVAILABLE FOR FOLLOW UP IN THE TIME FRAME PROVIDED.  RETURN TO THE EMERGENCY DEPARTMENT IMMEDIATELY FOR ANY WORSENING OR CONCERNS.        Abdominal Pain    You have been diagnosed with abdominal (belly) pain. The cause of your pain is not yet known.    Many things can cause abdominal pain such as infections and bowel (intestine) spasms. You might need another examination or more tests to find out why you have pain.    At this time, your pain does not seem to be caused by anything dangerous. You do not need surgery. You do not need to stay in the hospital.     Though we don't believe your condition is dangerous right now, it is important to be careful. Sometimes a problem that seems mild now can become serious later. If you do not get completely better or your symptoms get worse, you should seek more care. This is why it is important that you get additional help unless you are 100% improved   In 2 to 3 days, return here, go to the nearest Emergency Department or follow up with your regular doctor.    For the next 24 hours, Drink only clear liquids such as:   Water.   Clear broth.   Sports drinks.   Clear caffeine-free soft drinks, like 7-Up or Sprite.    Return here or go to the nearest Emergency Department immediately if:   Your pain does not go away or gets worse.   You cannot keep fluids down    Your vomit (throw up) is dark green.    You vomit (throw up) blood or see blood in your stool (poop). Blood might be bright red or dark red. It can also be black and look like tar.   You have a fever (temperature higher than 100.4F / 38C) or shaking chills.   Your skin or eyes look yellow.   Your urine looks brown.   You have severe diarrhea.    If you can't follow up with your doctor, or if at any time you feel you need to be rechecked or seen again, come back here or go to the nearest emergency department.

## 2020-11-11 NOTE — ED Triage Notes (Signed)
Intermittent left sided abd pain/pressure x2 weeks. Denies any aggravating/alleviating factors. Denies N/V/D. States pain migrates around abd but typically stays in LLQ. LLQ TTP.

## 2020-11-11 NOTE — ED Provider Notes (Signed)
History     Chief Complaint   Patient presents with    Abdominal Pain     HPI   Pt with below PMH now with upper "abdominal weirdness" for past 2 weeks.  Get strange pains sometimes and weird pressure and looked up on the internet and was concerned that it might be cancer.  Had negative endoscopy and colonoscopy in March.   Also a friend told her it could be a hernia.    No exacerbating or remitting factors.  No urinary sxs.  Hx of hysterectomy, tummy tuck.      Pt answers that there are no fevers, chills, nausea, vomiting, diarrhea, constipation, trauma or rash.        Past Medical History:   Diagnosis Date    Anxiety     Arthritis     Neck Right Hand    Asthma without status asthmaticus     Claustrophobia     Depression     Gastroesophageal reflux disease     Headache(784.0)     Sinus    Irritable bowel syndrome     Low back pain     MTHFR (methylene THF reductase) deficiency and homocystinuria     Other plastic surgery for unacceptable cosmetic appearance     Sciatica of left side     Seasonal allergies     Sleep apnea     Smokes with greater than 40 pack year history     Quit 2 weks ago.       Past Surgical History:   Procedure Laterality Date    ABDOMINOPLASTY, LIPOSUCTION (COSMETIC)  2009    CYSTOSCOPY, URETEROSCOPY  2015    EGD, COLONOSCOPY  2015    FACELIFT, (RHYTIDECTOMY) (COSMETIC)  02/06/2014    Procedure: FACELIFT, (RHYTIDECTOMY) (COSMETIC);  Surgeon: Katheren Puller, MD;  Location: [facility] OR;  Service: Plastics;  Laterality: Bilateral;  RHYTIDECTOMY (COSMETIC)     HYSTEROSCOPY, ENDOMETRIAL ABLATION, THERMACHOICE  2015    LIPOSUCTION, NECK (COSMETIC)  2009       Family History   Problem Relation Age of Onset    Heart disease Mother     Heart disease Father     Cancer Maternal Grandfather     Anesthesia problems Sister        Social  Social History     Tobacco Use    Smoking status: Current Every Day Smoker     Packs/day: 0.50     Years: 40.00     Pack years:  20.00     Types: Cigarettes    Smokeless tobacco: Never Used   Vaping Use    Vaping Use: Never used   Substance Use Topics    Alcohol use: Yes     Alcohol/week: 0.0 standard drinks     Comment: Occassional    Drug use: No       .     No Known Allergies    Home Medications     Med List Status: In Progress Set By: Ivor Reining at 11/11/2020  3:19 PM                Cholecalciferol (VITAMIN D) 400 units/mL liquid (NICU)     Take by mouth daily     cyclobenzaprine (FLEXERIL) 10 MG tablet     Take 1 tablet (10 mg total) by mouth 3 (three) times daily as needed for Muscle spasms     esomeprazole (NEXIUM) 40 MG capsule     Take 40  mg by mouth every morning before breakfast.     estradiol (VIVELLE-DOT) 0.05 MG/24HR     Place 1 patch onto the skin twice a week     estradiol-norethindrone (COMBIPATCH) 0.05-0.14 MG/DAY          levoFLOXacin (LEVAQUIN) 500 MG tablet     Take 500 mg by mouth daily     lidocaine (LIDODERM) 5 %     Place 1 patch onto the skin every 24 hours Remove & Discard patch within 12 hours or as directed by MD     Magnesium 300 MG Cap     Take by mouth     Probiotic Product (PROBIOTIC DAILY PO)     Take 2 tablets by mouth daily.     Spacer/Aero-Hold Chamber [employer]     Use with inhaler 2 puffs every 6 hours for one week           Flagged for Removal             buPROPion XL (WELLBUTRIN XL) 150 MG 24 hr tablet     TAKE ONE TABLET BY MOUTH EVERY DAY     DULoxetine (CYMBALTA) 30 MG capsule     Take 20 mg by mouth daily        Hormone Cream Base Cream     by Does not apply route.Hormone patch     ibuprofen (ADVIL,MOTRIN) 200 MG tablet     Take 600 mg by mouth every 6 (six) hours as needed for Pain           Review of Systems  Pertinent positives:    Epigastric pain    Constitutional:  Denies fever or chills   Eyes:  Denies change in visual acuity or eye pain  HENT:  Denies sore throat, trouble swallowing   Respiratory:  Denies cough or shortness of breath or wheeze   Cardiovascular:  Denies chest pain  or edema   GI:  Denies nausea, vomiting, bloody stools or diarrhea  GU:  Denies dysuria, hematuria, frequency or trouble urinating   Musculoskeletal:  Denies other joint pain  Integument:  Denies rash  or wound  Neurologic:  Denies headache, focal weakness, numbness, tingling, speech, vision or gait changes  Psychiatric:  Denies suicidal or homicidal ideation.        Physical Exam    BP: 121/78, Heart Rate: 85, Temp: 97.3 F (36.3 C), Resp Rate: 16, SpO2: 100 %, Weight: 85.9 kg    Physical Exam  Pertinent Positives:    No consistent definite tenderness  No hernia        Constitutional:  Well developed, well nourished, no acute distress, not ill appearing   Eyes:   conjunctiva normal, no discharge or redness  HENT:  Atraumatic, external ears normal, nose normal, oropharynx moist, no pharyngeal exudates. Neck- normal range of motion, no tenderness, supple   Respiratory:  No respiratory distress, normal breath sounds, no rales, no wheezing, no rhonchi  Cardiovascular:  Normal rate, normal rhythm, no murmurs, no gallops, no rubs, normal radial pulses and dpp   GI:  Soft, nondistended, nontender, no organomegaly, no mass, no rebound, no guarding   GU:  No costovertebral angle tenderness   Musculoskeletal:  No edema, no tenderness, no deformities. Back- no tenderness  Integument:  Well hydrated, no rash   Lymphatic:  No prominent cervical LAD  Neurologic:  Alert & oriented x 3, normal motor function, no focal deficits noted, coordination normal   Psychiatric:  Speech  and behavior appropriate       Diagnosis management comments: I, Ferdinand Cava, MD, have been the primary provider for this patient during this Emergency Dept visit.    Oxygen saturation by pulse oximetry is 99%, Normal, none needed.    DDx:   Gastritis vs gas vs msk vs biliary colic vs other.    When I was within 6 feet of this patient I donned the following PPE:  Surgical Mask y, Gloves Yes, Gown Yes; Goggles Yes; Face Shield No  , 53M 6000  Respirator Yes; N95 No  .  The patient was wearing a mask during my evaluation Yes.       Rhythm:  Normal Sinus  Ectopy:  None  Rate:  Normal  Conduction:  No blocks  ST Segments:  Normal ST segments  T Waves: inv T in v1./v3  Axis:  Normal  Q Waves:  None seen. Poor r wave progression  Clinical Impression:  Can't rule out anterior MI, nonspecific EKG  Interpreted by physician    Discussed with patient risks/benefits/alternatives of further efval, cat scan abd etc and patient would like to forego imaging at this time.    Patient Progress  Patient progress: stable        MDM and ED Course     ED Medication Orders (From admission, onward)    None             MDM                 Procedures  Results     Procedure Component Value Units Date/Time    Troponin I [[id number]] Collected: 11/11/20 1623    Specimen: Blood Updated: 11/11/20 1705     Troponin I <0.01 ng/mL     Lipase [[id number]] Collected: 11/11/20 1623    Specimen: Blood Updated: 11/11/20 1702     Lipase 11 U/L     GFR [[id number]] Collected: 11/11/20 1623     Updated: 11/11/20 1702     EGFR >60.0    Comprehensive metabolic panel [[id number]]  (Abnormal) Collected: 11/11/20 1623    Specimen: Blood Updated: 11/11/20 1702     Glucose 80 mg/dL      BUN 62.9 mg/dL      Creatinine 0.9 mg/dL      Sodium 528 mEq/L      Potassium 4.1 mEq/L      Chloride 102 mEq/L      CO2 24 mEq/L      Calcium 8.8 mg/dL      Protein, Total 6.3 g/dL      Albumin 3.8 g/dL      AST (SGOT) 12 U/L      ALT 13 U/L      Alkaline Phosphatase 53 U/L      Bilirubin, Total 0.3 mg/dL      Globulin 2.5 g/dL      Albumin/Globulin Ratio 1.5     Anion Gap 8.0    Urinalysis Reflex to Microscopic Exam- Reflex to Culture [[id number]]  (Abnormal) Collected: 11/11/20 1623     Updated: 11/11/20 1649     Urine Type Clean Catch     Color, UA Yellow     Clarity, UA Clear     Specific Gravity UA 1.002     Urine pH 6.5     Leukocyte Esterase, UA Negative     Nitrite, UA Negative     Protein, UR Negative     Glucose,  UA Negative  Ketones UA Negative     Urobilinogen, UA 0.2 mg/dL      Bilirubin, UA Negative     Blood, UA Trace     RBC, UA 0 -2 /hpf      WBC, UA 0 - 5 /hpf      Squamous Epithelial Cells, Urine 0 - 5 /hpf     CBC and differential [[id number]] Collected: 11/11/20 1623    Specimen: Blood Updated: 11/11/20 1643     WBC 8.54 x10 3/uL      Hgb 13.1 g/dL      Hematocrit 47.8 %      Platelets 243 x10 3/uL      RBC 4.17 x10 6/uL      MCV 92.1 fL      MCH 31.4 pg      MCHC 34.1 g/dL      RDW 13 %      MPV 10.6 fL      Neutrophils 62.8 %      Lymphocytes Automated 27.8 %      Monocytes 7.1 %      Eosinophils Automated 1.5 %      Basophils Automated 0.6 %      Immature Granulocytes 0.2 %      Nucleated RBC 0.0 /100 WBC      Neutrophils Absolute 5.36 x10 3/uL      Lymphocytes Absolute Automated 2.37 x10 3/uL      Monocytes Absolute Automated 0.61 x10 3/uL      Eosinophils Absolute Automated 0.13 x10 3/uL      Basophils Absolute Automated 0.05 x10 3/uL      Immature Granulocytes Absolute 0.02 x10 3/uL      Absolute NRBC 0.00 x10 3/uL         Radiology Results (24 Hour)     ** No results found for the last 24 hours. **          Clinical Impression & Disposition     Clinical Impression  Final diagnoses:   Left upper quadrant abdominal pain        ED Disposition     ED Disposition Condition Date/Time Comment    Discharge  Wed Nov 11, 2020  5:42 PM Monica Turner discharge to home/self care.    Condition at disposition: Stable           Discharge Medication List as of 11/11/2020  5:57 PM                    Ferdinand Cava, MD  11/13/20 [phone]

## 2020-11-12 LAB — ECG 12-LEAD
Atrial Rate: 76 {beats}/min
P Axis: 48 degrees
P-R Interval: 152 ms
Q-T Interval: 374 ms
QRS Duration: 76 ms
QTC Calculation (Bezet): 420 ms
R Axis: 16 degrees
T Axis: 36 degrees
Ventricular Rate: 76 {beats}/min

## 2021-02-16 ENCOUNTER — Emergency Department
Admission: EM | Admit: 2021-02-16 | Discharge: 2021-02-16 | Disposition: A | Discharge status: Home / Self Care | Attending: Emergency Medicine | Admitting: Emergency Medicine

## 2021-02-16 ENCOUNTER — Emergency Department

## 2021-02-16 DIAGNOSIS — R079 Chest pain, unspecified: Secondary | ICD-10-CM | POA: Insufficient documentation

## 2021-02-16 DIAGNOSIS — F17210 Nicotine dependence, cigarettes, uncomplicated: Secondary | ICD-10-CM | POA: Insufficient documentation

## 2021-02-16 DIAGNOSIS — M546 Pain in thoracic spine: Secondary | ICD-10-CM | POA: Insufficient documentation

## 2021-02-16 LAB — CBC AND DIFFERENTIAL
Absolute NRBC: 0 10*3/uL (ref 0.00–0.00)
Basophils Absolute Automated: 0.04 10*3/uL (ref 0.00–0.08)
Basophils Automated: 0.6 %
Eosinophils Absolute Automated: 0.11 10*3/uL (ref 0.00–0.44)
Eosinophils Automated: 1.6 %
Hematocrit: 42.3 % (ref 34.7–43.7)
Hgb: 14.7 g/dL (ref 11.4–14.8)
Immature Granulocytes Absolute: 0.01 10*3/uL (ref 0.00–0.07)
Immature Granulocytes: 0.1 %
Lymphocytes Absolute Automated: 1.92 10*3/uL (ref 0.42–3.22)
Lymphocytes Automated: 28.6 %
MCH: 31.9 pg (ref 25.1–33.5)
MCHC: 34.8 g/dL (ref 31.5–35.8)
MCV: 91.8 fL (ref 78.0–96.0)
MPV: 9.7 fL (ref 8.9–12.5)
Monocytes Absolute Automated: 0.43 10*3/uL (ref 0.21–0.85)
Monocytes: 6.4 %
Neutrophils Absolute: 4.21 10*3/uL (ref 1.10–6.33)
Neutrophils: 62.7 %
Nucleated RBC: 0 /100 WBC (ref 0.0–0.0)
Platelets: 248 10*3/uL (ref 142–346)
RBC: 4.61 10*6/uL (ref 3.90–5.10)
RDW: 13 % (ref 11–15)
WBC: 6.72 10*3/uL (ref 3.10–9.50)

## 2021-02-16 LAB — COMPREHENSIVE METABOLIC PANEL
ALT: 14 U/L (ref 0–55)
AST (SGOT): 15 U/L (ref 5–34)
Albumin/Globulin Ratio: 1.7 (ref 0.9–2.2)
Albumin: 4.3 g/dL (ref 3.5–5.0)
Alkaline Phosphatase: 59 U/L (ref 37–106)
Anion Gap: 10 (ref 5.0–15.0)
BUN: 9 mg/dL (ref 7.0–19.0)
Bilirubin, Total: 0.6 mg/dL (ref 0.2–1.2)
CO2: 25 mEq/L (ref 22–29)
Calcium: 9.3 mg/dL (ref 8.5–10.5)
Chloride: 102 mEq/L (ref 100–111)
Creatinine: 1.1 mg/dL — ABNORMAL HIGH (ref 0.6–1.0)
Globulin: 2.5 g/dL (ref 2.0–3.6)
Glucose: 102 mg/dL — ABNORMAL HIGH (ref 70–100)
Potassium: 4 mEq/L (ref 3.5–5.1)
Protein, Total: 6.8 g/dL (ref 6.0–8.3)
Sodium: 137 mEq/L (ref 136–145)

## 2021-02-16 LAB — ECG 12-LEAD
Atrial Rate: 78 {beats}/min
IHS MUSE NARRATIVE AND IMPRESSION: NORMAL
P Axis: 44 degrees
P-R Interval: 148 ms
Q-T Interval: 372 ms
QRS Duration: 72 ms
QTC Calculation (Bezet): 424 ms
R Axis: 7 degrees
T Axis: 42 degrees
Ventricular Rate: 78 {beats}/min

## 2021-02-16 LAB — B-TYPE NATRIURETIC PEPTIDE: B-Natriuretic Peptide: 11 pg/mL (ref 0–100)

## 2021-02-16 LAB — IHS D-DIMER: D-Dimer: 0.35 ug/mL FEU (ref 0.00–0.60)

## 2021-02-16 LAB — GFR: EGFR: 50.8

## 2021-02-16 LAB — PT AND APTT
PT INR: 1 (ref 0.9–1.1)
PT: 11.8 s (ref 10.1–12.9)
PTT: 32 s (ref 27–39)

## 2021-02-16 LAB — MAGNESIUM: Magnesium: 2.1 mg/dL (ref 1.6–2.6)

## 2021-02-16 LAB — TROPONIN I: Troponin I: <0.01 ng/mL (ref 0.00–0.05)

## 2021-02-16 LAB — PHOSPHORUS: Phosphorus: 3.3 mg/dL (ref 2.3–4.7)

## 2021-02-16 MED ORDER — KETOROLAC TROMETHAMINE 30 MG/ML IJ SOLN
60.0000 mg | Freq: Once | INTRAMUSCULAR | Status: AC
Start: 2021-02-16 — End: 2021-02-16
  Administered 2021-02-16: 60 mg via INTRAMUSCULAR
  Filled 2021-02-16: qty 2

## 2021-02-16 MED ORDER — SODIUM CHLORIDE 0.9 % IV BOLUS
1000.0000 mL | Freq: Once | INTRAVENOUS | Status: AC
Start: 2021-02-16 — End: 2021-02-16
  Administered 2021-02-16: 1000 mL via INTRAVENOUS

## 2021-02-16 MED ORDER — CYCLOBENZAPRINE HCL 10 MG PO TABS
10.0000 mg | ORAL_TABLET | Freq: Three times a day (TID) | ORAL | 0 refills | Status: AC | PRN
Start: 2021-02-16 — End: 2021-03-03

## 2021-02-16 MED ORDER — LORAZEPAM 2 MG/ML IJ SOLN
1.0000 mg | Freq: Once | INTRAMUSCULAR | Status: AC
Start: 2021-02-16 — End: 2021-02-16
  Administered 2021-02-16: 1 mg via INTRAVENOUS
  Filled 2021-02-16: qty 1

## 2021-02-16 NOTE — ED Triage Notes (Signed)
Pt c/o left shoulder blade pain for one week to a week and a half.  Pt states that the pain started to drive into her chest for the last three days.  Pt states the pain is better at night and gets worse shortly after waking up in the morning.

## 2021-02-16 NOTE — Discharge Instructions (Signed)
please rest and avoid aggravating activities.  Please follow-up.  Return to the ER for any concerns.

## 2021-02-16 NOTE — ED Provider Notes (Signed)
History     Chief Complaint   Patient presents with   . Chest Pain   . Shoulder Pain     60 year old female presents ER complaining of left shoulder blade pain that radiates to the left side of her chest and under her left breast.  Patient reports that she has had the left shoulder pain for "years."  She reports that over the past few weeks she has noticed that it started radiating into her chest some.  Patient reports that it is worsened x3 days.  Patient rates the pain at 6 out of 10 with radiation as above.  Patient complained of associated pain with movement.  Patient denies with inspiration or shortness of breath no nausea vomiting or dizziness.  Patient has been using over-the-counter Biofreeze patches with mild relief.  No cough fever chills.  No known sick contacts.  Patient reports she has been spending a fair amount of time in bed recently due to depression. Pt denies SI or HI.     The history is provided by the patient. No language interpreter was used.   Chest Pain  Pain location:  L chest  Pain quality: sharp    Radiates to: radiates from left shoulder blade   Pain severity:  Moderate  Onset quality:  Gradual  Timing:  Intermittent (more constant for  3 days)  Progression:  Worsening  Chronicity:  New  Context: not breathing and not movement    Relieved by: biofreeze.  Worsened by:  Nothing  Ineffective treatments:  None tried  Associated symptoms: back pain    Associated symptoms: no abdominal pain, no cough, no dizziness, no fever, no headache, no nausea, no numbness, no palpitations, no shortness of breath, no vomiting and no weakness    Risk factors: smoking    Risk factors: no coronary artery disease, no diabetes mellitus, no high cholesterol, no hypertension, not female and not pregnant         Past Medical History:   Diagnosis Date   . Anxiety    . Arthritis     Neck Right Hand   . Asthma without status asthmaticus    . Claustrophobia    . Depression    . Gastroesophageal reflux disease    .  Headache(784.0)     Sinus   . Irritable bowel syndrome    . Low back pain    . MTHFR (methylene THF reductase) deficiency and homocystinuria    . Other plastic surgery for unacceptable cosmetic appearance    . Sciatica of left side    . Seasonal allergies    . Sleep apnea    . Smokes with greater than 40 pack year history     Quit 2 weks ago.       Past Surgical History:   Procedure Laterality Date   . ABDOMINOPLASTY, LIPOSUCTION (COSMETIC)  2009   . CYSTOSCOPY, URETEROSCOPY  2015   . EGD, COLONOSCOPY  2015   . FACELIFT, (RHYTIDECTOMY) (COSMETIC)  02/06/2014    Procedure: FACELIFT, (RHYTIDECTOMY) (COSMETIC);  Surgeon: Katheren Puller, MD;  Location: [facility] OR;  Service: Plastics;  Laterality: Bilateral;  RHYTIDECTOMY (COSMETIC)    . HYSTEROSCOPY, ENDOMETRIAL ABLATION, THERMACHOICE  2015   . LIPOSUCTION, NECK (COSMETIC)  2009       Family History   Problem Relation Age of Onset   . Heart disease Mother    . Heart disease Father    . Cancer Maternal Grandfather    . Anesthesia problems  Sister        Social - lives with family   Social History     Tobacco Use   . Smoking status: Current Every Day Smoker     Packs/day: 0.50     Years: 40.00     Pack years: 20.00     Types: Cigarettes   . Smokeless tobacco: Never Used   Vaping Use   . Vaping Use: Never used   Substance Use Topics   . Alcohol use: Yes     Alcohol/week: 0.0 standard drinks     Comment: Occassional   . Drug use: No       .     No Known Allergies    Home Medications     Med List Status: In Progress Set By: Esperanza Heir, RN at 02/16/2021 12:18 PM                Cholecalciferol (VITAMIN D) 400 units/mL liquid (NICU)     Take by mouth daily     esomeprazole (NEXIUM) 40 MG capsule     Take 40 mg by mouth every morning before breakfast.     estradiol (VIVELLE-DOT) 0.05 MG/24HR     Place 1 patch onto the skin twice a week     estradiol-norethindrone (COMBIPATCH) 0.05-0.14 MG/DAY          levoFLOXacin (LEVAQUIN) 500 MG tablet     Take 500 mg by mouth  daily     lidocaine (LIDODERM) 5 %     Place 1 patch onto the skin every 24 hours Remove & Discard patch within 12 hours or as directed by MD     Magnesium 300 MG Cap     Take by mouth     Probiotic Product (PROBIOTIC DAILY PO)     Take 2 tablets by mouth daily.     Spacer/Aero-Hold Chamber [employer]     Use with inhaler 2 puffs every 6 hours for one week                     Flagged for Removal             buPROPion XL (WELLBUTRIN XL) 150 MG 24 hr tablet     TAKE ONE TABLET BY MOUTH EVERY DAY     DULoxetine (CYMBALTA) 30 MG capsule     Take 20 mg by mouth daily        Hormone Cream Base Cream     by Does not apply route.Hormone patch     ibuprofen (ADVIL,MOTRIN) 200 MG tablet     Take 600 mg by mouth every 6 (six) hours as needed for Pain           Review of Systems   Constitutional: Negative for chills and fever.   HENT: Negative for congestion and rhinorrhea.    Eyes: Negative for discharge and redness.   Respiratory: Negative for cough and shortness of breath.    Cardiovascular: Positive for chest pain. Negative for palpitations.   Gastrointestinal: Negative for abdominal pain, nausea and vomiting.   Genitourinary: Negative for dysuria, flank pain, frequency, hematuria and urgency.   Musculoskeletal: Positive for arthralgias and back pain. Negative for gait problem, joint swelling, myalgias, neck pain and neck stiffness.   Skin: Negative for color change, pallor, rash and wound.   Allergic/Immunologic: Negative for immunocompromised state.   Neurological: Negative for dizziness, syncope, weakness, numbness and headaches.   Hematological: Does not bruise/bleed easily.  Psychiatric/Behavioral: Negative for self-injury. The patient is not nervous/anxious.        Physical Exam    BP: 121/87, Heart Rate: 82, Temp: 97 F (36.1 C), Resp Rate: 20, SpO2: 99 %, Weight: 88.9 kg    Physical Exam  Vitals and nursing note reviewed.   Constitutional:       General: She is not in acute distress.     Appearance: She is  well-developed. She is not diaphoretic.      Comments: Pt is tearful      HENT:      Head: Normocephalic and atraumatic.      Right Ear: External ear normal.      Left Ear: External ear normal.      Nose: Nose normal.      Mouth/Throat:      Pharynx: No oropharyngeal exudate.   Eyes:      General: No scleral icterus.        Right eye: No discharge.         Left eye: No discharge.      Conjunctiva/sclera: Conjunctivae normal.      Pupils: Pupils are equal, round, and reactive to light.   Neck:      Vascular: No JVD.      Trachea: No tracheal deviation.   Cardiovascular:      Rate and Rhythm: Normal rate and regular rhythm.   Pulmonary:      Effort: Pulmonary effort is normal. No respiratory distress.      Breath sounds: Normal breath sounds. No stridor. No wheezing, rhonchi or rales.   Chest:      Chest wall: No tenderness.   Abdominal:      General: Bowel sounds are normal. There is no distension.      Palpations: Abdomen is soft. There is no mass.      Tenderness: There is no abdominal tenderness. There is no right CVA tenderness, left CVA tenderness, guarding or rebound.   Musculoskeletal:         General: Tenderness present. No swelling or signs of injury. Normal range of motion.      Cervical back: Normal range of motion and neck supple.      Comments: No midline C/T/L-spine tenderness palpation.  Patient with diffuse paraspinal tenderness to palpation in bilateral thoracic region.   Skin:     General: Skin is warm and dry.      Findings: No rash.   Neurological:      General: No focal deficit present.      Mental Status: She is alert and oriented to person, place, and time.   Psychiatric:         Attention and Perception: Attention normal.         Mood and Affect: Affect is flat.         Behavior: Behavior normal.         Thought Content: Thought content does not include homicidal or suicidal ideation. Thought content does not include homicidal or suicidal plan.         Cognition and Memory: Cognition normal.          Judgment: Judgment normal.      Comments: Pt tearful              MDM and ED Course     ED Medication Orders (From admission, onward)    Start Ordered     Status Ordering Provider    02/16/21 1313 02/16/21 1312  LORazepam (  ATIVAN) injection 1 mg  Once        Route: Intravenous  Ordered Dose: 1 mg     Last MAR action: Given CONCAUGH-GRUENDEL, Annalia Metzger E    02/16/21 1313 02/16/21 1312  ketorolac (TORADOL) injection 60 mg  Once        Route: Intramuscular  Ordered Dose: 60 mg     Last MAR action: Given CONCAUGH-GRUENDEL, Kayela Humphres E    02/16/21 1311 02/16/21 1310  sodium chloride 0.9 % bolus 1,000 mL  Once        Route: Intravenous  Ordered Dose: 1,000 mL     Last MAR action: Stopped CONCAUGH-GRUENDEL, Laniah Grimm E             MDM  Number of Diagnoses or Management Options  Acute bilateral thoracic back pain  Chest pain at rest  Diagnosis management comments: I, Langley Gauss, PA-C, have been the primary provider for Monica Turner during this Emergency Dept visit. The attending signature signifies review and agreement of the history, physical exam, evaluation, clinical impression and plan except as noted.   I have reviewed the nursing notes, including Past medical and surgical,Family and Social History     Oxygen Saturation by Pulse Oximetry  is 95%-100% - normal, no interventions needed     EKG Interpretation  EKG interpreted by ED physician    Rate: Normal  Rhythm: sinus rhythm  Axis: Normal  ST Segments: No acute ST segment changes  T waves: Non-specific T Wave change(s)   Conduction: No blocks  Impression: Non-specific EKG    Differential diagnosis chest pain, chest wall pain, cardiac event, angina, back pain, PE    Labs due to me CBC is within normal limits creatinine slightly increased 1.1 patient given fluids, troponin and D-dimer within normal limits    Multiple reassessments of the patient.  The patient is resting comfortably no acute distress she reports that the pain is now more  localized to a small area in her back that she can palpate.  Awaiting results for disposition.    Multiple reassessments of the patient. Pt resting comfortably in NAD. Pt with relief after meds. Discussed with patient need for rest, avoid aggravating activities and follow-up. Return to the ER for any concerns. Pt voices understanding. No questions.            Amount and/or Complexity of Data Reviewed  Clinical lab tests: ordered and reviewed  Tests in the radiology section of CPT: ordered and reviewed  Discuss the patient with other providers: yes  Independent visualization of images, tracings, or specimens: yes    Risk of Complications, Morbidity, and/or Mortality  Presenting problems: moderate  Management options: moderate    Patient Progress  Patient progress: stable               Heart Score    Flowsheet Row Value   History 0   EKG 1   Risk Factors 1   Total (with age) 3   Onset of pain (time of START of last episode of chest pain)? >6 hrs ago          Procedures  Results     Procedure Component Value Units Date/Time    Troponin I [[id number]] Collected: 02/16/21 1232    Specimen: Blood Updated: 02/16/21 1308     Troponin I <0.01 ng/mL     B-type Natriuretic Peptide [[id number]] Collected: 02/16/21 1232    Specimen: Blood Updated: 02/16/21 1259     B-Natriuretic Peptide 11  pg/mL     Magnesium [[id number]] Collected: 02/16/21 1232    Specimen: Blood Updated: 02/16/21 1259     Magnesium 2.1 mg/dL     Phosphorus [[id number]] Collected: 02/16/21 1232    Specimen: Blood Updated: 02/16/21 1259     Phosphorus 3.3 mg/dL     GFR [[id number]] Collected: 02/16/21 1232     Updated: 02/16/21 1259     EGFR 50.8    Comprehensive metabolic panel [[id number]]  (Abnormal) Collected: 02/16/21 1232    Specimen: Blood Updated: 02/16/21 1259     Glucose 102 mg/dL      BUN 9.0 mg/dL      Creatinine 1.1 mg/dL      Sodium 629 mEq/L      Potassium 4.0 mEq/L      Chloride 102 mEq/L      CO2 25 mEq/L      Calcium 9.3 mg/dL      Protein, Total  6.8 g/dL      Albumin 4.3 g/dL      AST (SGOT) 15 U/L      ALT 14 U/L      Alkaline Phosphatase 59 U/L      Bilirubin, Total 0.6 mg/dL      Globulin 2.5 g/dL      Albumin/Globulin Ratio 1.7     Anion Gap 10.0    D-Dimer [[id number]] Collected: 02/16/21 1232     Updated: 02/16/21 1254     D-Dimer 0.35 ug/mL FEU     PT/APTT [[id number]] Collected: 02/16/21 1232     Updated: 02/16/21 1254     PT 11.8 sec      PT INR 1.0     PTT 32 sec     CBC and differential [[id number]] Collected: 02/16/21 1232    Specimen: Blood Updated: 02/16/21 1239     WBC 6.72 x10 3/uL      Hgb 14.7 g/dL      Hematocrit 59.5 %      Platelets 248 x10 3/uL      RBC 4.61 x10 6/uL      MCV 91.8 fL      MCH 31.9 pg      MCHC 34.8 g/dL      RDW 13 %      MPV 9.7 fL      Neutrophils 62.7 %      Lymphocytes Automated 28.6 %      Monocytes 6.4 %      Eosinophils Automated 1.6 %      Basophils Automated 0.6 %      Immature Granulocytes 0.1 %      Nucleated RBC 0.0 /100 WBC      Neutrophils Absolute 4.21 x10 3/uL      Lymphocytes Absolute Automated 1.92 x10 3/uL      Monocytes Absolute Automated 0.43 x10 3/uL      Eosinophils Absolute Automated 0.11 x10 3/uL      Basophils Absolute Automated 0.04 x10 3/uL      Immature Granulocytes Absolute 0.01 x10 3/uL      Absolute NRBC 0.00 x10 3/uL           Radiology Results (24 Hour)     Procedure Component Value Units Date/Time    XR Chest  AP Portable [[id number]] Collected: 02/16/21 1303    Order Status: Completed Updated: 02/16/21 1305    Narrative:      History: Chest pain.    COMPARISON: 07/01/2019.    FINDINGS:    The lungs are  clear. The cardiomediastinal silhouette is within normal  limits. There are no effusions.      Impression:       No acute disease.    Rocky Crafts, MD   02/16/2021 1:03 PM          Clinical Impression & Disposition     Clinical Impression  Final diagnoses:   Chest pain at rest   Acute bilateral thoracic back pain        ED Disposition     ED Disposition   Discharge    Condition   --     Date/Time   Tue Feb 16, 2021  2:25 PM    Comment   Creta Dorame Tigert discharge to home/self care.    Condition at disposition: Stable                Discharge Medication List as of 02/16/2021  2:25 PM                    Concaugh-Gruendel, Sebastian Ache, PA  02/16/21 1524

## 2021-02-16 NOTE — ED Provider Notes (Signed)
Review of APP charts: I, Judye Bos MD, have reviewed the history, physical exam, evaluation, clinical impression,  and plan and agree.     L shoulder pain radiating to chest "for years." denies cough/fever.      Results     Procedure Component Value Units Date/Time    Troponin I [[id number]] Collected: 02/16/21 1232    Specimen: Blood Updated: 02/16/21 1308     Troponin I <0.01 ng/mL     B-type Natriuretic Peptide [[id number]] Collected: 02/16/21 1232    Specimen: Blood Updated: 02/16/21 1259     B-Natriuretic Peptide 11 pg/mL     Magnesium [[id number]] Collected: 02/16/21 1232    Specimen: Blood Updated: 02/16/21 1259     Magnesium 2.1 mg/dL     Phosphorus [[id number]] Collected: 02/16/21 1232    Specimen: Blood Updated: 02/16/21 1259     Phosphorus 3.3 mg/dL     GFR [[id number]] Collected: 02/16/21 1232     Updated: 02/16/21 1259     EGFR 50.8    Comprehensive metabolic panel [[id number]]  (Abnormal) Collected: 02/16/21 1232    Specimen: Blood Updated: 02/16/21 1259     Glucose 102 mg/dL      BUN 9.0 mg/dL      Creatinine 1.1 mg/dL      Sodium 536 mEq/L      Potassium 4.0 mEq/L      Chloride 102 mEq/L      CO2 25 mEq/L      Calcium 9.3 mg/dL      Protein, Total 6.8 g/dL      Albumin 4.3 g/dL      AST (SGOT) 15 U/L      ALT 14 U/L      Alkaline Phosphatase 59 U/L      Bilirubin, Total 0.6 mg/dL      Globulin 2.5 g/dL      Albumin/Globulin Ratio 1.7     Anion Gap 10.0    D-Dimer [[id number]] Collected: 02/16/21 1232     Updated: 02/16/21 1254     D-Dimer 0.35 ug/mL FEU     PT/APTT [[id number]] Collected: 02/16/21 1232     Updated: 02/16/21 1254     PT 11.8 sec      PT INR 1.0     PTT 32 sec     CBC and differential [[id number]] Collected: 02/16/21 1232    Specimen: Blood Updated: 02/16/21 1239     WBC 6.72 x10 3/uL      Hgb 14.7 g/dL      Hematocrit 41.6 %      Platelets 248 x10 3/uL      RBC 4.61 x10 6/uL      MCV 91.8 fL      MCH 31.9 pg      MCHC 34.8 g/dL      RDW 13 %      MPV 9.7 fL      Neutrophils 62.7 %       Lymphocytes Automated 28.6 %      Monocytes 6.4 %      Eosinophils Automated 1.6 %      Basophils Automated 0.6 %      Immature Granulocytes 0.1 %      Nucleated RBC 0.0 /100 WBC      Neutrophils Absolute 4.21 x10 3/uL      Lymphocytes Absolute Automated 1.92 x10 3/uL      Monocytes Absolute Automated 0.43 x10 3/uL      Eosinophils Absolute Automated 0.11 x10 3/uL  Basophils Absolute Automated 0.04 x10 3/uL      Immature Granulocytes Absolute 0.01 x10 3/uL      Absolute NRBC 0.00 x10 3/uL         Radiology Results (24 Hour)     Procedure Component Value Units Date/Time    XR Chest  AP Portable [[id number]] Collected: 02/16/21 1303    Order Status: Completed Updated: 02/16/21 1305    Narrative:      History: Chest pain.    COMPARISON: 07/01/2019.    FINDINGS:    The lungs are clear. The cardiomediastinal silhouette is within normal  limits. There are no effusions.      Impression:       No acute disease.    Rocky Crafts, MD   02/16/2021 1:03 PM              This note was generated by the Epic EMR system/Dragon speech recognition and may contain inherent errors or omissions not intended by the user. Grammatical errors, random word insertions, deletions, pronoun errors and incomplete sentences are occasional consequences of this technology due to software limitations. Not all errors are caught or corrected. If there are questions or concerns about the content of this note or information contained within the body of this dictation they should be addressed directly with the author for clarification.      Vss. Pt to f/u with pcm/cardiology clinic. [place].     Guadalupe Maple, MD  02/16/21 [phone]

## 2021-07-03 ENCOUNTER — Other Ambulatory Visit: Payer: Self-pay

## 2022-04-13 ENCOUNTER — Ambulatory Visit
Admission: RE | Admit: 2022-04-13 | Discharge: 2022-04-13 | Disposition: A | Discharge status: Home / Self Care | Source: Ambulatory Visit | Attending: Internal Medicine | Admitting: Internal Medicine

## 2022-04-13 ENCOUNTER — Other Ambulatory Visit: Payer: Self-pay | Admitting: Internal Medicine

## 2022-04-13 DIAGNOSIS — M5489 Other dorsalgia: Secondary | ICD-10-CM

## 2022-04-19 ENCOUNTER — Emergency Department
Admission: EM | Admit: 2022-04-19 | Discharge: 2022-04-19 | Disposition: A | Discharge status: Home / Self Care | Attending: Emergency Medicine | Admitting: Emergency Medicine

## 2022-04-19 ENCOUNTER — Emergency Department

## 2022-04-19 DIAGNOSIS — R110 Nausea: Secondary | ICD-10-CM | POA: Insufficient documentation

## 2022-04-19 DIAGNOSIS — F17210 Nicotine dependence, cigarettes, uncomplicated: Secondary | ICD-10-CM | POA: Insufficient documentation

## 2022-04-19 DIAGNOSIS — R1032 Left lower quadrant pain: Secondary | ICD-10-CM | POA: Insufficient documentation

## 2022-04-19 DIAGNOSIS — K6389 Other specified diseases of intestine: Secondary | ICD-10-CM | POA: Insufficient documentation

## 2022-04-19 LAB — CBC AND DIFFERENTIAL
Absolute NRBC: 0 10*3/uL (ref 0.00–0.00)
Basophils Absolute Automated: 0.05 10*3/uL (ref 0.00–0.08)
Basophils Automated: 0.6 %
Eosinophils Absolute Automated: 0.11 10*3/uL (ref 0.00–0.44)
Eosinophils Automated: 1.4 %
Hematocrit: 39.6 % (ref 34.7–43.7)
Hgb: 13.9 g/dL (ref 11.4–14.8)
Immature Granulocytes Absolute: 0.02 10*3/uL (ref 0.00–0.07)
Immature Granulocytes: 0.3 %
Instrument Absolute Neutrophil Count: 5.06 10*3/uL (ref 1.10–6.33)
Lymphocytes Absolute Automated: 1.99 10*3/uL (ref 0.42–3.22)
Lymphocytes Automated: 25.6 %
MCH: 31.4 pg (ref 25.1–33.5)
MCHC: 35.1 g/dL (ref 31.5–35.8)
MCV: 89.4 fL (ref 78.0–96.0)
MPV: 9.8 fL (ref 8.9–12.5)
Monocytes Absolute Automated: 0.55 10*3/uL (ref 0.21–0.85)
Monocytes: 7.1 %
Neutrophils Absolute: 5.06 10*3/uL (ref 1.10–6.33)
Neutrophils: 65 %
Nucleated RBC: 0 /100 WBC (ref 0.0–0.0)
Platelets: 241 10*3/uL (ref 142–346)
RBC: 4.43 10*6/uL (ref 3.90–5.10)
RDW: 13 % (ref 11–15)
WBC: 7.78 10*3/uL (ref 3.10–9.50)

## 2022-04-19 LAB — COMPREHENSIVE METABOLIC PANEL
ALT: 17 U/L (ref 0–55)
AST (SGOT): 19 U/L (ref 5–41)
Albumin/Globulin Ratio: 1.6 (ref 0.9–2.2)
Albumin: 3.8 g/dL (ref 3.5–5.0)
Alkaline Phosphatase: 60 U/L (ref 37–117)
Anion Gap: 7 (ref 5.0–15.0)
BUN: 16 mg/dL (ref 7.0–21.0)
Bilirubin, Total: 0.2 mg/dL (ref 0.2–1.2)
CO2: 26 mEq/L (ref 17–29)
Calcium: 9.6 mg/dL (ref 8.5–10.5)
Chloride: 105 mEq/L (ref 99–111)
Creatinine: 0.9 mg/dL (ref 0.4–1.0)
Globulin: 2.4 g/dL (ref 2.0–3.6)
Glucose: 84 mg/dL (ref 70–100)
Potassium: 4.3 mEq/L (ref 3.5–5.3)
Protein, Total: 6.2 g/dL (ref 6.0–8.3)
Sodium: 138 mEq/L (ref 135–145)
eGFR: >60 mL/min/{1.73_m2} (ref >=60)

## 2022-04-19 LAB — URINALYSIS REFLEX TO MICROSCOPIC EXAM - REFLEX TO CULTURE
Bilirubin, UA: NEGATIVE
Blood, UA: NEGATIVE
Glucose, UA: NEGATIVE
Ketones UA: NEGATIVE
Leukocyte Esterase, UA: NEGATIVE
Nitrite, UA: NEGATIVE
Protein, UR: NEGATIVE
Specific Gravity UA: 1.004 (ref 1.001–1.035)
Urine pH: 6.5 (ref 5.0–8.0)
Urobilinogen, UA: 0.2 mg/dL (ref 0.2–2.0)

## 2022-04-19 LAB — LIPASE: Lipase: 20 U/L (ref 8–78)

## 2022-04-19 MED ORDER — HYDROCODONE-ACETAMINOPHEN 5-300 MG PO TABS
5.0000 mg | ORAL_TABLET | Freq: Four times a day (QID) | ORAL | 0 refills | Status: AC | PRN
Start: 2022-04-19 — End: 2022-05-01

## 2022-04-19 MED ORDER — DIATRIZOATE MEGLUMINE & SODIUM 66-10 % PO SOLN
400.0000 mL | Freq: Once | ORAL | Status: AC | PRN
Start: 2022-04-19 — End: 2022-04-19
  Filled 2022-04-19: qty 900

## 2022-04-19 MED ORDER — SODIUM CHLORIDE 0.9 % IV BOLUS
1000.0000 mL | Freq: Once | INTRAVENOUS | Status: AC
Start: 2022-04-19 — End: 2022-04-19
  Administered 2022-04-19: 1000 mL via INTRAVENOUS

## 2022-04-19 MED ORDER — ONDANSETRON HCL 4 MG/2ML IJ SOLN
4.0000 mg | Freq: Once | INTRAMUSCULAR | Status: AC
Start: 2022-04-19 — End: 2022-04-19
  Administered 2022-04-19: 4 mg via INTRAVENOUS
  Filled 2022-04-19: qty 2

## 2022-04-19 MED ORDER — FENTANYL CITRATE (PF) 50 MCG/ML IJ SOLN (WRAP)
75.0000 ug | Freq: Once | INTRAMUSCULAR | Status: AC
Start: 2022-04-19 — End: 2022-04-19
  Administered 2022-04-19: 75 ug via INTRAVENOUS
  Filled 2022-04-19: qty 2

## 2022-04-19 MED ORDER — IOHEXOL FLAVORED BEVERAGE PO
1000.0000 mL | Freq: Once | ORAL | Status: AC | PRN
Start: 2022-04-19 — End: 2022-04-19
  Administered 2022-04-19: 1000 mL via ORAL

## 2022-04-19 MED ORDER — IOHEXOL 12 MG/ML PO SOLN
1000.0000 mL | Freq: Once | ORAL | Status: AC | PRN
Start: 2022-04-19 — End: 2022-04-19

## 2022-04-19 MED ORDER — IOHEXOL 350 MG/ML IV SOLN
100.0000 mL | Freq: Once | INTRAVENOUS | Status: AC | PRN
Start: 2022-04-19 — End: 2022-04-19
  Administered 2022-04-19: 100 mL via INTRAVENOUS

## 2022-04-19 MED ORDER — IOHEXOL 9 MG/ML PO SOLN
1333.0000 mL | Freq: Once | ORAL | Status: AC | PRN
Start: 2022-04-19 — End: 2022-04-19

## 2022-04-19 NOTE — ED Triage Notes (Signed)
LLQ pain intermittent x4 days. Worse with movement/walking. Denies N/V/D, fevers, urinary sx. NTTP.

## 2022-04-19 NOTE — ED Provider Notes (Signed)
History     Chief Complaint   Patient presents with    Abdominal Pain       Abdominal Pain       Past Medical History:   Diagnosis Date    Anxiety     Arthritis     Neck Right Hand    Asthma without status asthmaticus     Claustrophobia     Depression     Gastroesophageal reflux disease     Headache(784.0)     Sinus    Irritable bowel syndrome     Low back pain     MTHFR (methylene THF reductase) deficiency and homocystinuria     Other plastic surgery for unacceptable cosmetic appearance     Sciatica of left side     Seasonal allergies     Sleep apnea     Smokes with greater than 40 pack year history     Quit 2 weks ago.       Past Surgical History:   Procedure Laterality Date    ABDOMINOPLASTY, LIPOSUCTION (COSMETIC)  2009    CYSTOSCOPY, URETEROSCOPY  2015    EGD, COLONOSCOPY  2015    FACELIFT, (RHYTIDECTOMY) (COSMETIC)  02/06/2014    Procedure: FACELIFT, (RHYTIDECTOMY) (COSMETIC);  Surgeon: Katheren Puller, MD;  Location: [facility] OR;  Service: Plastics;  Laterality: Bilateral;  RHYTIDECTOMY (COSMETIC)     HYSTEROSCOPY, ENDOMETRIAL ABLATION, THERMACHOICE  2015    LIPOSUCTION, NECK (COSMETIC)  2009       Family History   Problem Relation Age of Onset    Heart disease Mother     Heart disease Father     Cancer Maternal Grandfather     Anesthesia problems Sister        Social  Social History     Tobacco Use    Smoking status: Every Day     Packs/day: 0.50     Years: 40.00     Total pack years: 20.00     Types: Cigarettes    Smokeless tobacco: Never   Vaping Use    Vaping Use: Some days   Substance Use Topics    Alcohol use: Not Currently     Comment: Occassional    Drug use: No       .     No Known Allergies    Home Medications       Med List Status: In Progress Set By: Ivor Reining at 04/19/2022 10:05 AM              Cholecalciferol (VITAMIN D) 400 units/mL liquid (NICU)     Take by mouth daily     clonazePAM (KlonoPIN) 0.5 MG tablet          DULoxetine (CYMBALTA) 30 MG capsule     Take 2 capsules  (60 mg) by mouth daily     esomeprazole (NEXIUM) 40 MG capsule     Take 1 capsule (40 mg) by mouth every morning before breakfast     fluticasone (FLONASE) 50 MCG/ACT nasal spray          lidocaine (LIDODERM) 5 %     Place 1 patch onto the skin every 24 hours Remove & Discard patch within 12 hours or as directed by MD     Magnesium 300 MG Cap     Take by mouth     Mounjaro 10 MG/0.5ML Solution Pen-injector          Probiotic Product (PROBIOTIC DAILY PO)  Take 2 tablets by mouth daily.     Spacer/Aero-Hold Chamber [employer]     Use with inhaler 2 puffs every 6 hours for one week          Flagged for Removal               buPROPion XL (WELLBUTRIN XL) 150 MG 24 hr tablet     TAKE ONE TABLET BY MOUTH EVERY DAY     DULoxetine (CYMBALTA) 30 MG capsule     Take 20 mg by mouth daily        estradiol (VIVELLE-DOT) 0.05 MG/24HR     Place 1 patch onto the skin twice a week     estradiol-norethindrone (COMBIPATCH) 0.05-0.14 MG/DAY          Hormone Cream Base Cream     by Does not apply route.Hormone patch     ibuprofen (ADVIL,MOTRIN) 200 MG tablet     Take 600 mg by mouth every 6 (six) hours as needed for Pain     levoFLOXacin (LEVAQUIN) 500 MG tablet     Take 1 tablet (500 mg) by mouth daily             Review of Systems   Gastrointestinal:  Positive for abdominal pain.   Pertinent positives:    Abd pain and nausea    Constitutional:  Denies fever or chills   Eyes:  Denies change in visual acuity or eye pain  HENT:  Denies sore throat, trouble swallowing   Respiratory:  Denies cough or shortness of breath or wheeze   Cardiovascular:  Denies chest pain or swelling   GI:  Denies a, vomiting or diarrhea  GU:  Denies dysuria, hematuria, frequency or trouble urinating   Musculoskeletal:  Denies other joint pain  Integument:  Denies rash  or wound  Neurologic:  Denies headache, focal weakness, numbness, tingling, speech, vision or gait changes  Psychiatric:  Denies suicidal ideation.      Physical Exam    BP: 123/83, Heart Rate:  89, Temp: 97 F (36.1 C), Resp Rate: 16, SpO2: 100 %, Weight: 76.5 kg    Physical Exam    Pertinent Positives:    Abd tenderness suprapubic area    Constitutional:  Well developed, well nourished, no acute distress, not ill appearing   Eyes:   no discharge or redness  HENT:  Atraumatic, external ears normal, external nose/nares normal, oropharynx moist, no pharyngeal exudates. Neck- normal range of motion  Respiratory:  No respiratory distress, normal breath sounds, no rales, no wheezing, no rhonchi  Cardiovascular:  Normal rate, normal rhythm, no murmurs, no gallops, no rubs, normal radial pulses and dpp   GI:  Soft, nondistended, no rebound, no guarding    Musculoskeletal:  No edema, no tenderness, no deformities.   Back- no tenderness  Integument:  Well hydrated, no rash   Neurologic:  Alert & oriented x 3, normal motor function, no focal deficits noted, coordination normal   Psychiatric:  Speech and behavior appropriate       Diagnosis management comments: I, Ferdinand Cava, MD, have been the primary provider for this patient during this Emergency Dept visit.    Oxygen saturation by pulse oximetry is greater than 94%, Normal, none needed.        When I was within 6 feet of this patient I donned the following PPE:  K95 Mask y, Gloves Yes,       Ct abd visualized and interpreted independently by  me as no appy        Shared decision making process utilized for patient orders including imaging, labs and medications.        Patient Progress  Patient progress: stable      MDM and ED Course     ED Medication Orders (From admission, onward)      Start Ordered     Status Ordering Provider    04/19/22 1252 04/19/22 1252  iohexol (OMNIPAQUE) 350 MG/ML injection 100 mL  IMG once as needed        Route: Intravenous  Ordered Dose: 100 mL       Last MAR action: Imaging Agent Given Liliane Channel    04/19/22 1033 04/19/22 1032  sodium chloride 0.9 % bolus 1,000 mL  Once        Route: Intravenous  Ordered Dose: 1,000  mL       Last MAR action: Stopped Stevie Charter J    04/19/22 1032 04/19/22 1031  ondansetron (ZOFRAN) injection 4 mg  Once        Route: Intravenous  Ordered Dose: 4 mg       Last MAR action: Given Kada Friesen J    04/19/22 1031 04/19/22 1031  iohexol (OMNIPAQUE) 12 mg/mL oral cocktail (diluted Omnipaque 240) 1,000 mL  IMG once as needed        Route: Oral  Ordered Dose: 1,000 mL      See Hyperspace for full Linked Orders Report.    Last MAR action: Imaging Agent Given Liliane Channel    04/19/22 1031 04/19/22 1031  iohexol (OMNIPAQUE) 12 mg/mL oral solution (premix) 1,000 mL  IMG once as needed        Route: Oral  Ordered Dose: 1,000 mL      See Hyperspace for full Linked Orders Report.    Last MAR action: See Alternative Shi Grose J    04/19/22 1031 04/19/22 1031  iohexol (OMNIPAQUE) 9 MG/ML oral solution 1,333 mL  IMG once as needed        Route: Oral  Ordered Dose: 1,333 mL      See Hyperspace for full Linked Orders Report.    Last MAR action: See Alternative Liliane Channel    04/19/22 1031 04/19/22 1031  diatrizoate meglumine-sodium (GASTROGRAFIN) 66-10 % solution 400-900 mL  IMG once as needed        Route: Oral  Ordered Dose: 400-900 mL      See Hyperspace for full Linked Orders Report.    Last MAR action: See Alternative Shyheim Tanney J    04/19/22 1031 04/19/22 1031  fentaNYL (PF) (SUBLIMAZE) injection 75 mcg  Once        Route: Intravenous  Ordered Dose: 75 mcg       Last MAR action: Given Amor Hyle J               Medical Decision Making  Patient with no significant past medical history states over the last for 5 days she had increasing left lower quadrant pain today it hurt so much when she stood up or walk so she was coming to get it checked out.  There are no vomiting, fever, chills, trauma, rash, urinary symptoms, new cough or shortness of breath.  No loss of consciousness, no alcohol or drug use, no head, neck, back, chest, pain or injury.  No numbness, tingling, focal  weakness, speech, vision or gait trouble.    Ddx: Diverticulitis versus colitis versus  mass versus UTI versus other      Labs, CT, meds, fluids        Problems Addressed:  Epiploic appendagitis: acute illness or injury  Left lower quadrant abdominal pain: acute illness or injury that poses a threat to life or bodily functions  Nausea: acute illness or injury    Amount and/or Complexity of Data Reviewed  Labs: ordered. Decision-making details documented in ED Course.  Radiology: ordered and independent interpretation performed. Decision-making details documented in ED Course.    Risk  Prescription drug management.  Parenteral controlled substances.                     Procedures  Results       Procedure Component Value Units Date/Time    Urinalysis Reflex to Microscopic Exam- Reflex to Culture [[id number]] Collected: 04/19/22 1147     Updated: 04/19/22 1157     Urine Type Urine, Clean Ca     Color, UA STRAW     Clarity, UA CLEAR     Specific Gravity UA 1.004     Urine pH 6.5     Leukocyte Esterase, UA NEGATIVE     Nitrite, UA NEGATIVE     Protein, UR NEGATIVE     Glucose, UA NEGATIVE     Ketones UA NEGATIVE     Urobilinogen, UA 0.2 mg/dL      Bilirubin, UA NEGATIVE     Blood, UA NEGATIVE    Comprehensive metabolic panel [[id number]] Collected: 04/19/22 1049    Specimen: Blood Updated: 04/19/22 1119     Glucose 84 mg/dL      BUN 78.2 mg/dL      Creatinine 0.9 mg/dL      Sodium 956 mEq/L      Potassium 4.3 mEq/L      Chloride 105 mEq/L      CO2 26 mEq/L      Calcium 9.6 mg/dL      Protein, Total 6.2 g/dL      Albumin 3.8 g/dL      AST (SGOT) 19 U/L      ALT 17 U/L      Alkaline Phosphatase 60 U/L      Bilirubin, Total 0.2 mg/dL      Globulin 2.4 g/dL      Albumin/Globulin Ratio 1.6     Anion Gap 7.0     eGFR >60.0 mL/min/1.73 m2     Lipase [[id number]] Collected: 04/19/22 1049    Specimen: Blood Updated: 04/19/22 1119     Lipase 20 U/L     CBC and differential [[id number]] Collected: 04/19/22 1049    Specimen: Blood  Updated: 04/19/22 1102     WBC 7.78 x10 3/uL      Hgb 13.9 g/dL      Hematocrit 41.3 %      Platelets 241 x10 3/uL      RBC 4.43 x10 6/uL      MCV 89.4 fL      MCH 31.4 pg      MCHC 35.1 g/dL      RDW 13 %      MPV 9.8 fL      Instrument Absolute Neutrophil Count 5.06 x10 3/uL      Neutrophils 65.0 %      Lymphocytes Automated 25.6 %      Monocytes 7.1 %      Eosinophils Automated 1.4 %      Basophils Automated 0.6 %  Immature Granulocytes 0.3 %      Nucleated RBC 0.0 /100 WBC      Neutrophils Absolute 5.06 x10 3/uL      Lymphocytes Absolute Automated 1.99 x10 3/uL      Monocytes Absolute Automated 0.55 x10 3/uL      Eosinophils Absolute Automated 0.11 x10 3/uL      Basophils Absolute Automated 0.05 x10 3/uL      Immature Granulocytes Absolute 0.02 x10 3/uL      Absolute NRBC 0.00 x10 3/uL           Radiology Results (24 Hour)       Procedure Component Value Units Date/Time    CT Abdomen Pelvis W IV And PO Cont [[id number]] Collected: 04/19/22 1300    Order Status: Completed Updated: 04/19/22 1307    Narrative:      HISTORY: Abdominal pain     COMPARISON: CT abdomen/post dated 08/04/2018    TECHNIQUE: CT of the abdomen and pelvis performed with 100 mL of Omnipaque 350 intravenous contrast. The following dose reduction techniques were utilized: automated exposure control and/or adjustment of the mA and/or KV according to patient size, and   the use of an iterative reconstruction technique.    FINDINGS:     Clear lung bases. Small cyst in the inferior right hepatic lobe. Top of the right hepatic dome is not included within the field-of-view. Unremarkable spleen, pancreas, gallbladder, adrenals, and kidneys. Small nodule near the pancreatic tail is favored   to represent a splenule.    Normal aortic caliber. No enlarged lymph nodes, free air, or free fluid. Normal appendix. Sigmoid diverticulosis. No bowel obstruction. Along the lateral aspect of the mid sigmoid colon, there is a 1.8 x 1.4 cm fat-containing lesion  (series 3/image 145)   with central hyperdensity and a thin rim and adjacent stranding consistent with epiploic appendagitis. Unremarkable mildly distended bladder. Status post hysterectomy. Mild degenerative changes of the lumbar spine.      Impression:        Epiploic appendagitis along the sigmoid colon.     Carlynn Spry, MD  04/19/2022 1:05 PM            Clinical Impression & Disposition     Clinical Impression  Final diagnoses:   Left lower quadrant abdominal pain   Nausea   Epiploic appendagitis        ED Disposition       ED Disposition   Discharge    Condition   --    Date/Time   Tue Apr 19, 2022  1:38 PM    Comment   Jasmain Ahlberg Stead discharge to home/self care.    Condition at disposition: Stable                  Discharge Medication List as of 04/19/2022  1:40 PM        START taking these medications    Details   HYDROcodone-Acetaminophen 5-300 MG Tab Take 5 mg of opioid by mouth every 6 (six) hours as needed (lower abdominal pain), Starting Tue 04/19/2022, Until Sun 05/01/2022 at 2359, E[facility]Rx                         Liliane Channel, MD  04/20/22 1606

## 2022-04-19 NOTE — Discharge Instructions (Signed)
YOU MAY SEE THE DOCTOR LISTED IF YOUR DOCTOR IS NOT AVAILABLE FOR FOLLOW UP IN THE TIME FRAME PROVIDED.  RETURN TO THE EMERGENCY DEPARTMENT IMMEDIATELY FOR ANY WORSENING OR CONCERNS.      Epiploic Appendagitis Omental Infarction     You have been diagnosed with epiploic appendagitis or an omental infarction.       An epiploic appendage is an extra extension of normal tissue present on the surface of the colon, or the large intestine. This extra tissue is filled with fat and covered by the normal lining of the colon. It also holds arteries and veins that supply blood to that part of the colon.  Normally, everyone has about 50 to 100 of these epiploic appendages. Most of them are in the lower part of the colon.  You have epiploic appendagitis when the blood vessels to these appendages get twisted. This is most common with the veins. When this happens, the appendage loses its blood supply.  When these blood vessels get very twisted, you might feel pain in your belly. You might also vomit (throw up) or feel nauseated (sick to your stomach).  While this is painful, epiploic appendagitis is not dangerous or life-threatening to you. The main blood supply to the colon is still working.      A similar problem can happen when your omentum twists on itself and gets kinked. Omentum is the fatty tissue that keeps your intestines together.   When this happens, the blood supply to your colon can get blocked. Like with epiploic appendagitis, this can cause belly pain. You might also vomit (throw up) or feel nauseated (sick to your stomach).  This is called an omental infarct.  This also gets better on its own most of the time.      Both epiploic appendagitis and omental infarcts can feel similar to appendicitis or diverticulitis.  To check for epiploic appendagitis or omental infarcts, your doctor will do image scans of your belly. Most often, you will get a CT ("CAT") scan or an ultrasound. With time, epiploic appendagitis and  omental infarcts most often get better by themselves. You will only need medicine for the pain.  However, if the symptoms don't go away, you might need surgery. This is rare, though.      Take your pain medicine as your doctor tells you to. Follow up with your primary doctor or a general surgeon.     Though we don't believe your condition is serious right now, it is important to be careful. Sometimes a problem that seems mild can become serious later. This is why it is very important that you return here or go to the nearest Emergency Department if you are not improving or your symptoms are getting worse.     YOU SHOULD SEEK MEDICAL ATTENTION IMMEDIATELY, EITHER HERE OR AT THE NEAREST EMERGENCY DEPARTMENT, IF ANY OF THE FOLLOWING OCCUR:     You are unable to keep any foods down.    You have a fever (temperature higher than 100.1F or 38C).   You have a lot of belly pain, and it is worse than it was before.      If you can't follow up with your doctor, or if at any time you feel you need to be rechecked or seen again, come back here or go to the nearest emergency department.

## 2022-10-16 ENCOUNTER — Emergency Department
Admission: EM | Admit: 2022-10-16 | Discharge: 2022-10-17 | Disposition: A | Discharge status: Home / Self Care | Attending: Emergency Medical Services | Admitting: Emergency Medical Services

## 2022-10-16 ENCOUNTER — Emergency Department

## 2022-10-16 DIAGNOSIS — R1032 Left lower quadrant pain: Secondary | ICD-10-CM | POA: Insufficient documentation

## 2022-10-16 HISTORY — DX: Volvulus: K56.2

## 2022-10-16 LAB — URINALYSIS WITH REFLEX TO MICROSCOPIC EXAM - REFLEX TO CULTURE
Bilirubin, UA: NEGATIVE
Blood, UA: NEGATIVE
Glucose, UA: NEGATIVE
Ketones UA: NEGATIVE
Nitrite, UA: NEGATIVE
Specific Gravity UA: 1.022 (ref 1.001–1.035)
Urine pH: 6 (ref 5.0–8.0)
Urobilinogen, UA: NORMAL mg/dL (ref 0.2–2.0)

## 2022-10-16 LAB — COMPREHENSIVE METABOLIC PANEL
ALT: 9 U/L (ref 0–55)
AST (SGOT): 11 U/L (ref 5–41)
Albumin/Globulin Ratio: 1.6 (ref 0.9–2.2)
Albumin: 3.9 g/dL (ref 3.5–5.0)
Alkaline Phosphatase: 52 U/L (ref 37–117)
Anion Gap: 10 (ref 5.0–15.0)
BUN: 18 mg/dL (ref 7.0–21.0)
Bilirubin, Total: 0.4 mg/dL (ref 0.2–1.2)
CO2: 23 mEq/L (ref 17–29)
Calcium: 9.2 mg/dL (ref 8.5–10.5)
Chloride: 105 mEq/L (ref 99–111)
Creatinine: 0.9 mg/dL (ref 0.4–1.0)
Globulin: 2.5 g/dL (ref 2.0–3.6)
Glucose: 79 mg/dL (ref 70–100)
Potassium: 3.4 mEq/L — ABNORMAL LOW (ref 3.5–5.3)
Protein, Total: 6.4 g/dL (ref 6.0–8.3)
Sodium: 138 mEq/L (ref 135–145)
eGFR: >60 mL/min/{1.73_m2} (ref >=60)

## 2022-10-16 LAB — CBC AND DIFFERENTIAL
Absolute NRBC: 0 10*3/uL (ref 0.00–0.00)
Basophils Absolute Automated: 0.03 10*3/uL (ref 0.00–0.08)
Basophils Automated: 0.5 %
Eosinophils Absolute Automated: 0.1 10*3/uL (ref 0.00–0.44)
Eosinophils Automated: 1.5 %
Hematocrit: 36.3 % (ref 34.7–43.7)
Hgb: 12.7 g/dL (ref 11.4–14.8)
Immature Granulocytes Absolute: 0.01 10*3/uL (ref 0.00–0.07)
Immature Granulocytes: 0.2 %
Instrument Absolute Neutrophil Count: 3.93 10*3/uL (ref 1.10–6.33)
Lymphocytes Absolute Automated: 2.15 10*3/uL (ref 0.42–3.22)
Lymphocytes Automated: 32.3 %
MCH: 31.4 pg (ref 25.1–33.5)
MCHC: 35 g/dL (ref 31.5–35.8)
MCV: 89.6 fL (ref 78.0–96.0)
MPV: 9.8 fL (ref 8.9–12.5)
Monocytes Absolute Automated: 0.43 10*3/uL (ref 0.21–0.85)
Monocytes: 6.5 %
Neutrophils Absolute: 3.93 10*3/uL (ref 1.10–6.33)
Neutrophils: 59 %
Nucleated RBC: 0 /100 WBC (ref 0.0–0.0)
Platelets: 236 10*3/uL (ref 142–346)
RBC: 4.05 10*6/uL (ref 3.90–5.10)
RDW: 13 % (ref 11–15)
WBC: 6.65 10*3/uL (ref 3.10–9.50)

## 2022-10-16 LAB — LIPASE: Lipase: 18 U/L (ref 8–78)

## 2022-10-16 MED ORDER — ONDANSETRON HCL 4 MG/2ML IJ SOLN
2.0000 mg | Freq: Once | INTRAMUSCULAR | Status: AC
Start: 2022-10-16 — End: 2022-10-16
  Administered 2022-10-16: 2 mg via INTRAVENOUS
  Filled 2022-10-16: qty 2

## 2022-10-16 MED ORDER — SODIUM CHLORIDE 0.9 % IV BOLUS
1000.0000 mL | Freq: Once | INTRAVENOUS | Status: AC
Start: 2022-10-16 — End: 2022-10-16
  Administered 2022-10-16: 1000 mL via INTRAVENOUS

## 2022-10-16 MED ORDER — IOHEXOL 350 MG/ML IV SOLN
100.0000 mL | Freq: Once | INTRAVENOUS | Status: AC | PRN
Start: 2022-10-16 — End: 2022-10-16
  Administered 2022-10-16: 100 mL via INTRAVENOUS

## 2022-10-16 MED ORDER — KETOROLAC TROMETHAMINE 30 MG/ML IJ SOLN
30.0000 mg | Freq: Once | INTRAMUSCULAR | Status: AC
Start: 2022-10-16 — End: 2022-10-16
  Administered 2022-10-16: 30 mg via INTRAVENOUS
  Filled 2022-10-16: qty 1

## 2022-10-16 MED ORDER — MORPHINE SULFATE 4 MG/ML IJ/IV SOLN (WRAP)
4.0000 mg | Freq: Once | Status: AC
Start: 2022-10-16 — End: 2022-10-16
  Administered 2022-10-16: 4 mg via INTRAVENOUS
  Filled 2022-10-16: qty 1

## 2022-10-16 MED ORDER — KETOROLAC TROMETHAMINE 10 MG PO TABS
10.0000 mg | ORAL_TABLET | Freq: Four times a day (QID) | ORAL | 0 refills | Status: DC | PRN
Start: 2022-10-16 — End: 2023-03-14

## 2022-10-16 NOTE — Discharge Instructions (Signed)
F/u with your OB/GYN later tomorrow as previously set up as well as pcp and your GI in 2-3 days for re-eval. return to ed if worsen or not better or have any acute concerns.

## 2022-10-16 NOTE — ED Provider Notes (Signed)
History     Chief Complaint   Patient presents with    Abdominal Pain    Flank Pain     61 year old female presents with complaints of left lower quadrant and left flank pain starting about 3 days ago.  Symptoms were slow onset, initially mild now moderate, constant, gradual worsening, thinks it better.  Denies f/c/n/v/d/c/chest pain/sob/headahce/photophobia/rash/focal neuro deficits/loc/neck pain/vaginal bleeding or discharge/urinary symptoms/bloody melanotic or black stools.  States similar to her previous pain of epiploic appendagitis.  Patient has had hysterectomy but still has her ovaries    The history is provided by the patient and medical records. No language interpreter was used.   Abdominal Pain  Associated symptoms: no chest pain, no chills, no cough, no diarrhea, no dysuria, no fever, no hematuria, no nausea, no shortness of breath, no sore throat, no vaginal bleeding, no vaginal discharge and no vomiting    Flank Pain  Associated symptoms include abdominal pain. Pertinent negatives include no chest pain, chills, coughing, fever, headaches, myalgias, nausea, neck pain, numbness, sore throat, vomiting or weakness.        Past Medical History:   Diagnosis Date    Anxiety     Arthritis     Neck Right Hand    Asthma without status asthmaticus     Claustrophobia     Depression     Gastroesophageal reflux disease     Headache(784.0)     Sinus    Irritable bowel syndrome     Low back pain     MTHFR (methylene THF reductase) deficiency and homocystinuria     Other plastic surgery for unacceptable cosmetic appearance     Sciatica of left side     Seasonal allergies     Sleep apnea     Smokes with greater than 40 pack year history     Quit 2 weks ago.    Torsion of appendix epiploicae        Past Surgical History:   Procedure Laterality Date    ABDOMINOPLASTY, LIPOSUCTION (COSMETIC)  2009    CYSTOSCOPY, URETEROSCOPY  2015    EGD, COLONOSCOPY  2015    FACELIFT, (RHYTIDECTOMY) (COSMETIC)  02/06/2014     Procedure: FACELIFT, (RHYTIDECTOMY) (COSMETIC);  Surgeon: Katheren Puller, MD;  Location: [facility] OR;  Service: Plastics;  Laterality: Bilateral;  RHYTIDECTOMY (COSMETIC)     HYSTEROSCOPY, ENDOMETRIAL ABLATION, THERMACHOICE  2015    LIPOSUCTION, NECK (COSMETIC)  2009       Family History   Problem Relation Age of Onset    Heart disease Mother     Heart disease Father     Cancer Maternal Grandfather     Anesthesia problems Sister        Social  Social History     Tobacco Use    Smoking status: Former     Packs/day: 0.50     Years: 40.00     Additional pack years: 0.00     Total pack years: 20.00     Types: Cigarettes     Quit date: 05/2022     Years since quitting: 0.4    Smokeless tobacco: Never   Vaping Use    Vaping Use: Every day   Substance Use Topics    Alcohol use: Not Currently     Comment: Occassional    Drug use: No       .     No Known Allergies    Home Medications  Med List Status: In Progress Set By: Raechel Ache, RN at 10/16/2022  8:43 PM              Cholecalciferol (VITAMIN D) 400 units/mL liquid (NICU)     Take by mouth daily     clonazePAM (KlonoPIN) 0.5 MG tablet          esomeprazole (NEXIUM) 40 MG capsule     Take 1 capsule (40 mg) by mouth every morning before breakfast     fluticasone (FLONASE) 50 MCG/ACT nasal spray          Magnesium 300 MG Cap     Take by mouth     Mounjaro 10 MG/0.5ML Solution Pen-injector          Probiotic Product (PROBIOTIC DAILY PO)     Take 2 tablets by mouth daily.     vitamin B-12 (CYANOCOBALAMIN) 250 MCG tablet     Take 1 tablet (250 mcg) by mouth daily                                                           Review of Systems   Constitutional:  Negative for chills and fever.   HENT:  Negative for rhinorrhea and sore throat.    Eyes:  Negative for photophobia and discharge.   Respiratory:  Negative for cough and shortness of breath.    Cardiovascular:  Negative for chest pain and palpitations.   Gastrointestinal:  Positive for abdominal pain.  Negative for diarrhea, nausea and vomiting.   Genitourinary:  Positive for flank pain. Negative for dysuria, frequency, hematuria, vaginal bleeding and vaginal discharge.   Musculoskeletal:  Negative for back pain, myalgias and neck pain.   Neurological:  Negative for dizziness, syncope, weakness, light-headedness, numbness and headaches.   Psychiatric/Behavioral:  Negative for confusion and suicidal ideas.        Physical Exam    BP: 103/74, Heart Rate: 84, Temp: 98.1 F (36.7 C), Resp Rate: 16, SpO2: 99 %, Weight: 63 kg    Physical Exam  Vitals and nursing note reviewed.   Constitutional:       General: She is not in acute distress.     Appearance: She is well-developed. She is not toxic-appearing or diaphoretic.   HENT:      Head: Normocephalic and atraumatic.      Mouth/Throat:      Pharynx: No oropharyngeal exudate.   Eyes:      General: Lids are normal.         Right eye: No discharge.         Left eye: No discharge.      Conjunctiva/sclera: Conjunctivae normal.      Right eye: Right conjunctiva is not injected. No exudate.     Left eye: Left conjunctiva is not injected. No exudate.     Pupils: Pupils are equal, round, and reactive to light.   Neck:      Vascular: No carotid bruit or JVD.      Trachea: No tracheal tenderness.   Cardiovascular:      Rate and Rhythm: Normal rate and regular rhythm.      Pulses: Normal pulses.      Heart sounds: Normal heart sounds.   Pulmonary:      Effort: Pulmonary effort is normal. No  respiratory distress.      Breath sounds: Normal breath sounds. No stridor. No decreased breath sounds, wheezing, rhonchi or rales.   Chest:      Chest wall: No tenderness.   Abdominal:      General: Bowel sounds are normal. There is no distension.      Palpations: Abdomen is soft. There is no mass.      Tenderness: There is abdominal tenderness. There is no right CVA tenderness, left CVA tenderness, guarding or rebound.      Hernia: No hernia is present.       Musculoskeletal:         General:  No tenderness. Normal range of motion.      Cervical back: Full passive range of motion without pain, normal range of motion and neck supple. No spinous process tenderness or muscular tenderness. Normal range of motion.      Comments: No calf tenderness, no Homman's sign   Skin:     General: Skin is warm.      Coloration: Skin is not pale.      Findings: No rash.   Neurological:      General: No focal deficit present.      Mental Status: She is alert and oriented to person, place, and time.      GCS: GCS eye subscore is 4. GCS verbal subscore is 5. GCS motor subscore is 6.      Cranial Nerves: No cranial nerve deficit.      Sensory: Sensation is intact. No sensory deficit.      Motor: Motor function is intact. No weakness.      Coordination: Coordination is intact. Coordination normal.      Deep Tendon Reflexes: Reflexes are normal and symmetric.      Reflex Scores:       Patellar reflexes are 2+ on the right side and 2+ on the left side.  Psychiatric:         Speech: Speech normal.         Behavior: Behavior normal.         Thought Content: Thought content normal.         Judgment: Judgment normal.           MDM and ED Course     ED Medication Orders (From admission, onward)      Start Ordered     Status Ordering Provider    10/16/22 2039 10/16/22 2038  sodium chloride 0.9 % bolus 1,000 mL  Once        Route: Intravenous  Ordered Dose: 1,000 mL       Last MAR action: New Bag Reine Just R    10/16/22 2039 10/16/22 2038  ketorolac (TORADOL) injection 30 mg  Once        Route: Intravenous  Ordered Dose: 30 mg       Last MAR action: Given Reine Just R               Medical Decision Making  I, Janese Banks, have assumed care of patient.  I have completed his/her evaluation, reviewed all pertinent data, and determined his final disposition.    " *This note was generated by the Epic EMR system/ Dragon speech recognition and may contain inherent errors or omissions not intended by the user. Grammatical errors,  random word insertions, deletions, pronoun errors and incomplete sentences are occasional consequences of this technology due to software limitations. Not all errors are caught or  corrected. If there are questions or concerns about the content of this note or information contained within the body of this dictation they should be addressed directly with the author for clarification."      Oxygen saturation by pulse oximetry is 95%-100%, Normal.  Interventions: None Needed.     I reviewed all labs and/or radiology studies.     I reviewed nursing recorded vitals and history including PMSFHX         When I was within 6 feet of this patient I donned the following PPE:  Surgical Mask No  , Gloves Yes, Gown No  ; Goggles No  ; Face Shield No  , N95/respirator Yes.  The patient was wearing a mask during my evaluation Yes.      CRITICAL CARE    Critical care time:  60 minutes.  Reason:                     .    Critical care time spent in discussion with consultants, discussion with patient and/or family, review of laboratory and/or radiographic studies, direct bedside management, recurrent reassessments, charting, and/or review of patient records.  Critical care time is independent of any billable procedures.      Number and Complexity of Problems Addressed (select at least one)    Complexity: High: 1 acute or chronic illness or injury that poses a threat to life or bodily function      Presenting acute/chronic problems: Abdominal pain, flank pain    Differential diagnosis to include but not limited to: Differential diagnosis : appendicitis, diverticulitis, cholecystitis, pancreatitis, bowel perforation, ulcers, bowel obstruction, kidney stone,uti, pyelonephritis, AAA, epiploic appendagitis    workup:  Labs, meds, CAT scan, fluids, reeval  Chronic illness impacting care (obesity, diabetes, hypertension, elderly - state impact): 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    This message will disappear upon signing. :  L\  Look below for information    Independent visualized and interpretation of radiological study by me:       CAT scan of the abdomen does not show signs of bowel obstruction-read by Dr. Jannet Askew and used for medical decision making.  Rest of the exam will be read by the radiologist    ______________________________________________________________________      Discussion of managment:  Labs unremarkable.  CAT scan ultrasound showed:    US Pelvic with Transvaginal (r/o torsion) (Final result)  Result time 10/16/22 23:07:15  Final result by Conrad Burlington, MD (10/16/22 23:07:15)             Impression:       1. Status post hysterectomy.  2. Ovaries are not clearly visualized.    Gerlene Burdock, MD  10/16/2022 11:07 PM        Narrative:     HISTORY:  Left lower quadrant abdominal pain, rule out torsion.  History of  hysterectomy.    COMPARISON: 08/11/2015 and CT on 10/16/2022    TECHNIQUE: Both transabdominal and endovaginal imaging was performed.  Transabdominal examination of the pelvis was performed via partially  distended urinary bladder.    FINDINGS:  Measurements:  Uterus: Not present cm  Right ovary: Not visualized cm  Left ovary: Not visualized cm    Region of the vaginal cuff is unremarkable.    Neither of the ovaries are clearly visualized and are  likely obscured by  bowel. No suspicious adnexal mass identified.    No free fluid is present.                 CT Abd/Pelvis with IV Contrast only (Final result)  Result time 10/16/22 21:32:22  Final result by Florie Flatten, MD (10/16/22 09:81:19)             Impression:       No acute intra-abdominal process. Colonic diverticulosis without  diverticulitis.    Shamecka Flatten, MD  10/16/2022 9:32 PM        Narrative:     HISTORY:  Left lower quadrant abdominal pain for 3 days    COMPARISON: 04/19/2022    TECHNIQUE: CT of the abdomen and pelvis performed with intravenous  contrast. The following dose reduction techniques were utilized: automated  exposure control and/or adjustment of the mA and/or KV according to patient  size, and the use of an iterative reconstruction technique.    CONTRAST: iohexol (OMNIPAQUE) 350 MG/ML injection 100 mL    FINDINGS:  Lung bases are clear and heart size is normal.    Descending and sigmoid colon diverticulosis without CT evidence of the  diverticulitis. Normal appendix and small bowel loops. No enlarged  mesenteric lymph nodes.    Normal liver, gallbladder, pancreas, spleen and right adrenal gland. Stable  1.1 x 1.0 cm nodule arising from the lateral limb of the left adrenal  gland, likely a benign adenoma.    Kidneys enhance symmetrically. No hydronephrosis. Normal caliber abdominal  aorta. No enlarged retroperitoneal nodes. No enlarged pelvic lymph nodes.    Normal urinary bladder. No pelvic free fluid.    No acute osseous abnormality.    Patient has appointment tomorrow with her OB/GYN.  Advised her to also follow-up with her PCP and GI doctor for further evaluation.    Discussed study results and treatment plan with patient and family.     Will d/c home with meds and physician f/u. Pt. Understands to return to ed if worsen or not better or have any acute concerns.  Risk of Complications and/or Morbidity or Mortality of Patient Management  (select one if applicable)    Risk: High Risk and Parenteral controlled substances          Amount and/or Complexity of Data Reviewed  Labs: ordered.  Radiology: ordered.    Risk  Prescription drug management.                     Procedures    Clinical Impression & Disposition     Clinical Impression  Final diagnoses:   Left lower quadrant abdominal pain        ED Disposition       ED Disposition   Discharge    Condition   --    Date/Time   Sun Oct 16, 2022 11:50 PM     Comment   Caili Escalera [place] discharge to home/self care.    Condition at disposition: Stable                  New Prescriptions    KETOROLAC (TORADOL) 10 MG TABLET    Take 1 tablet (10 mg) by mouth every 6 (six) hours as needed for Pain                   Janese Banks, MD  10/16/22 2352

## 2023-03-14 ENCOUNTER — Emergency Department
Admission: EM | Admit: 2023-03-14 | Discharge: 2023-03-14 | Disposition: A | Discharge status: Home / Self Care | Attending: Emergency Medicine | Admitting: Emergency Medicine

## 2023-03-14 ENCOUNTER — Emergency Department

## 2023-03-14 DIAGNOSIS — R197 Diarrhea, unspecified: Secondary | ICD-10-CM

## 2023-03-14 DIAGNOSIS — E860 Dehydration: Secondary | ICD-10-CM

## 2023-03-14 DIAGNOSIS — R55 Syncope and collapse: Secondary | ICD-10-CM | POA: Insufficient documentation

## 2023-03-14 LAB — URINALYSIS WITH REFLEX TO MICROSCOPIC EXAM - REFLEX TO CULTURE
Bilirubin, UA: NEGATIVE
Blood, UA: NEGATIVE
Glucose, UA: NEGATIVE
Leukocyte Esterase, UA: NEGATIVE
Nitrite, UA: NEGATIVE
Specific Gravity UA: 1.021 (ref 1.001–1.035)
Urine pH: 6 (ref 5.0–8.0)
Urobilinogen, UA: NORMAL mg/dL (ref 0.2–2.0)

## 2023-03-14 LAB — COMPREHENSIVE METABOLIC PANEL
ALT: 30 U/L (ref 0–55)
AST (SGOT): 23 U/L (ref 5–41)
Albumin/Globulin Ratio: 1.4 (ref 0.9–2.2)
Albumin: 4.1 g/dL (ref 3.5–5.0)
Alkaline Phosphatase: 81 U/L (ref 37–117)
Anion Gap: 13 (ref 5.0–15.0)
BUN: 22 mg/dL — ABNORMAL HIGH (ref 7.0–21.0)
Bilirubin, Total: 0.4 mg/dL (ref 0.2–1.2)
CO2: 20 mEq/L (ref 17–29)
Calcium: 9.9 mg/dL (ref 8.5–10.5)
Chloride: 105 mEq/L (ref 99–111)
Creatinine: 1.1 mg/dL — ABNORMAL HIGH (ref 0.4–1.0)
Globulin: 2.9 g/dL (ref 2.0–3.6)
Glucose: 128 mg/dL — ABNORMAL HIGH (ref 70–100)
Potassium: 3.6 mEq/L (ref 3.5–5.3)
Protein, Total: 7 g/dL (ref 6.0–8.3)
Sodium: 138 mEq/L (ref 135–145)
eGFR: 57.1 mL/min/{1.73_m2} — AB (ref >=60)

## 2023-03-14 LAB — HIGH SENSITIVITY TROPONIN-I WITH DELTA
hs Troponin-I Delta: UNDETERMINED ng/L
hs Troponin-I Delta: 1.1 ng/L
hs Troponin-I: 5.8 ng/L
hs Troponin-I: 6.9 ng/L

## 2023-03-14 LAB — CBC AND DIFFERENTIAL
Absolute NRBC: 0 10*3/uL (ref 0.00–0.00)
Basophils Absolute Automated: 0.04 10*3/uL (ref 0.00–0.08)
Basophils Automated: 0.5 %
Eosinophils Absolute Automated: 0.14 10*3/uL (ref 0.00–0.44)
Eosinophils Automated: 1.8 %
Hematocrit: 44.1 % — ABNORMAL HIGH (ref 34.7–43.7)
Hgb: 15.3 g/dL — ABNORMAL HIGH (ref 11.4–14.8)
Immature Granulocytes Absolute: 0.01 10*3/uL (ref 0.00–0.07)
Immature Granulocytes: 0.1 %
Instrument Absolute Neutrophil Count: 3.29 10*3/uL (ref 1.10–6.33)
Lymphocytes Absolute Automated: 3.82 10*3/uL — ABNORMAL HIGH (ref 0.42–3.22)
Lymphocytes Automated: 48.7 %
MCH: 31 pg (ref 25.1–33.5)
MCHC: 34.7 g/dL (ref 31.5–35.8)
MCV: 89.5 fL (ref 78.0–96.0)
MPV: 11.2 fL (ref 8.9–12.5)
Monocytes Absolute Automated: 0.55 10*3/uL (ref 0.21–0.85)
Monocytes: 7 %
Neutrophils Absolute: 3.29 10*3/uL (ref 1.10–6.33)
Neutrophils: 41.9 %
Nucleated RBC: 0 /100 WBC (ref 0.0–0.0)
Platelets: 244 10*3/uL (ref 142–346)
RBC: 4.93 10*6/uL (ref 3.90–5.10)
RDW: 13 % (ref 11–15)
WBC: 7.85 10*3/uL (ref 3.10–9.50)

## 2023-03-14 LAB — ECG 12-LEAD
Atrial Rate: 55 {beats}/min
P Axis: 55 degrees
P-R Interval: 152 ms
Q-T Interval: 452 ms
QRS Duration: 76 ms
QTC Calculation (Bezet): 432 ms
R Axis: 31 degrees
T Axis: 43 degrees
Ventricular Rate: 55 {beats}/min

## 2023-03-14 LAB — LIPASE: Lipase: 20 U/L (ref 8–78)

## 2023-03-14 LAB — HIGH SENSITIVITY TROPONIN-I: hs Troponin-I: <2.7 ng/L

## 2023-03-14 MED ORDER — SODIUM CHLORIDE 0.9 % IV BOLUS
1000.0000 mL | Freq: Once | INTRAVENOUS | Status: AC
Start: 2023-03-14 — End: 2023-03-14
  Administered 2023-03-14: 1000 mL via INTRAVENOUS

## 2023-03-14 MED ORDER — LACTATED RINGERS IV BOLUS
1000.0000 mL | Freq: Once | INTRAVENOUS | Status: AC
Start: 2023-03-14 — End: 2023-03-14
  Administered 2023-03-14: 1000 mL via INTRAVENOUS

## 2023-03-14 MED ORDER — ONDANSETRON 4 MG PO TBDP
4.0000 mg | ORAL_TABLET | Freq: Four times a day (QID) | ORAL | 0 refills | Status: AC | PRN
Start: 2023-03-14 — End: ?

## 2023-03-14 NOTE — ED Triage Notes (Signed)
Pt arrived via EMS with C/C of N/V, diarrhea, and near syncopal episode. Pt stated that she had loose stool last night and that this morning she became dizzy and "sweaty" and "felt like she was going to pass out.

## 2023-03-14 NOTE — ED Provider Notes (Signed)
History     Chief Complaint   Patient presents with    Emesis    Diarrhea    Nausea    Near syncope     HPI     History of present illness:    62 yo female reports diarrhea and nausea symptom onset yesterday, states this morning was on toiler for several minutes had prolonged watery nonbloody stool and during this time started feeling lightheaded like she might pass out. Denies fully losing consciousness. Was sitting on toilet during episode, denies fall or head injury. Denies other assoc symptoms including fever, chills, chest pain, cough, SOB, back or flank pain, abdominal pain, dysuria, hematuria, weakness/numbness, speech or vision changes or vertiginous symptoms, headache, pain or swelling in her legs.    Past Medical History:   Diagnosis Date    Anxiety     Arthritis     Neck Right Hand    Asthma without status asthmaticus     Claustrophobia     Depression     Gastroesophageal reflux disease     Headache(784.0)     Sinus    Irritable bowel syndrome     Low back pain     MTHFR (methylene THF reductase) deficiency and homocystinuria     Other plastic surgery for unacceptable cosmetic appearance     Sciatica of left side     Seasonal allergies     Sleep apnea     Smokes with greater than 40 pack year history     Quit 2 weks ago.    Torsion of appendix epiploicae        Past Surgical History:   Procedure Laterality Date    ABDOMINOPLASTY, LIPOSUCTION (COSMETIC)  2009    CYSTOSCOPY, URETEROSCOPY  2015    EGD, COLONOSCOPY  2015    FACELIFT, (RHYTIDECTOMY) (COSMETIC)  02/06/2014    Procedure: FACELIFT, (RHYTIDECTOMY) (COSMETIC);  Surgeon: Katheren Puller, MD;  Location: [facility] OR;  Service: Plastics;  Laterality: Bilateral;  RHYTIDECTOMY (COSMETIC)     HYSTEROSCOPY, ENDOMETRIAL ABLATION, THERMACHOICE  2015    LIPOSUCTION, NECK (COSMETIC)  2009       Family History   Problem Relation Age of Onset    Heart disease Mother     Heart disease Father     Cancer Maternal Grandfather     Anesthesia problems  Sister        Social  Social History     Tobacco Use    Smoking status: Former     Current packs/day: 0.00     Average packs/day: 0.5 packs/day for 40.0 years (20.0 ttl pk-yrs)     Types: Cigarettes     Start date: 05/1982     Quit date: 05/2022     Years since quitting: 0.8    Smokeless tobacco: Never   Vaping Use    Vaping status: Every Day   Substance Use Topics    Alcohol use: Not Currently    Drug use: No       .     No Known Allergies    Home Medications       Med List Status: In Progress Set By: Talbert Forest, RN at 03/14/2023  8:29 AM              Cholecalciferol (VITAMIN D) 400 units/mL liquid (NICU)     Take by mouth daily     clonazePAM (KlonoPIN) 0.5 MG tablet          esomeprazole (NEXIUM)  40 MG capsule     Take 1 capsule (40 mg) by mouth every morning before breakfast     fluticasone (FLONASE) 50 MCG/ACT nasal spray          Magnesium 300 MG Cap     Take by mouth     Mounjaro 10 MG/0.5ML Solution Pen-injector          Probiotic Product (PROBIOTIC DAILY PO)     Take 2 tablets by mouth daily.     vitamin B-12 (CYANOCOBALAMIN) 250 MCG tablet     Take 1 tablet (250 mcg) by mouth daily                 Review of Systems   All other systems reviewed and are negative.      Physical Exam    BP: 104/67, Heart Rate: 63, Temp: 97.1 F (36.2 C), Resp Rate: 18, SpO2: 100 %, Weight: 77.4 kg    Physical Exam  Vitals and nursing note reviewed.   Constitutional:       General: She is not in acute distress.     Appearance: She is not toxic-appearing or diaphoretic.   HENT:      Head: Normocephalic and atraumatic.      Right Ear: External ear normal.      Left Ear: External ear normal.      Mouth/Throat:      Mouth: Mucous membranes are dry.   Eyes:      Conjunctiva/sclera: Conjunctivae normal.   Cardiovascular:      Rate and Rhythm: Normal rate and regular rhythm.      Heart sounds: Normal heart sounds.   Pulmonary:      Effort: Pulmonary effort is normal. No respiratory distress.      Breath sounds: Normal breath  sounds.   Abdominal:      General: There is no distension.      Palpations: Abdomen is soft.      Tenderness: There is no abdominal tenderness. There is no guarding.   Musculoskeletal:         General: No swelling, deformity or signs of injury.   Skin:     General: Skin is warm and dry.      Capillary Refill: Capillary refill takes less than 2 seconds.      Coloration: Skin is not jaundiced or pale.      Findings: No bruising or rash.   Neurological:      General: No focal deficit present.      Mental Status: She is alert and oriented to person, place, and time.      Cranial Nerves: No cranial nerve deficit.      Sensory: No sensory deficit.      Motor: No weakness.   Psychiatric:         Behavior: Behavior normal.           MDM and ED Course     ED Medication Orders (From admission, onward)      Start Ordered     Status Ordering Provider    03/14/23 1232 03/14/23 1231  lactated ringers bolus 1,000 mL  Once        Route: Intravenous  Ordered Dose: 1,000 mL       Last MAR action: Stopped Erman Thum L    03/14/23 1029 03/14/23 1028  sodium chloride 0.9 % bolus 1,000 mL  Once        Route: Intravenous  Ordered Dose: 1,000 mL  Last MAR action: Stopped Herma Mering    03/14/23 1610 03/14/23 0854  sodium chloride 0.9 % bolus 1,000 mL  Once        Route: Intravenous  Ordered Dose: 1,000 mL       Last MAR action: Stopped Rowan Pollman L               Medical Decision Making  Amount and/or Complexity of Data Reviewed  Labs: ordered. Decision-making details documented in ED Course.  Radiology: ordered. Decision-making details documented in ED Course.  ECG/medicine tests: ordered.    Risk  Prescription drug management.        Number and Complexity of Problems Addressed (select at least one)    Complexity: High: 1 acute or chronic illness or injury that poses a threat to life or bodily function      Differential diagnosis and management:     Presentation suspicious for near syncope 2/2 dehydration with  concomitant vagal episode in setting of acute diarrheal illness. Benign abdominal exam, doubt diverticulitis or acute abdominal surgical pathology. Borderline low BP, labs  show hgb hemoconcentrated with mild BUN/CR elevation c/w dehydration. Pt reports baseline BP is in 90s. Afebrile, no other SIRS criteria, doubt septic process. EKG sinus rhythm nonischemic, will obtain serial trop to further rule out ACS. Nonfocal neuro exam doubt CVA. No reported fall or trauma. Will send stool studies for diarrhea if pt able to provide sample. Will give IV hydration and observe pt in ED.    Of note, pt reports 3rd round of weight loss injection recently, discussed with pt possibility of medication side effect, recommended pt discuss with prescribing provider before resuming injections.    Reassessment: pt reports feeling much better lightheadedness resolved after IVF. BP in 90s systolic likely baseline for pt. Serial trop stable per [facility] high sensitivity troponin algorithm. Pt has no further complaints at this time. Did have risk/benefit discussion with pt regarding admission for monitoring of blood pressure and symptoms vs discharge with strict return precautions. Pt states she wishes to go home, will f/u closely outpt. Pt unable to provider stool sample in ED, recommended obtain outpt studies. Discussed f/u with pcp and GI. Will rx for PRN zofran. Discussed strict return precautions. Given resources for f/u. All questions answered. Pt discharged accompanied by family member.    ______________________________________________________________________  Amount/complexity of Data Reviewed   L\  Look below for information    History obtained from another historian -see HPI or here (parent, spouse,  care giver, ems) : EMS - report sinus rhythm enroute no trauma        ______________________________________________________________________    Risk of Complications and/or Morbidity or Mortality of Patient Management  (select one if  applicable)    Risk: Prescription drug management                                ED Course as of 03/14/23 1602   Tue Mar 14, 2023   0948 Procedure Note: EKG  Interpreted by: Madyson Lukach  Indications/Diagnosis: Near syncope  EKG Reviewed by me, the ED Provider: yes  The EKG demonstrates:  Rhythm: sinus bradycardia  Intervals: normal intervals  Axis: normal axis  QRS/Blocks: narrow QRS no Blocks  ST Segment: No STD/STE. No STEMI.     [JS]   0948 HR in 70s at this time on monitor [JS]   0949 Chest AP Portable  I personally reviewed and  interpreted these images. Clear lungs with no infiltrate, pneumothorax, or pulmonary edema.  No acute mediastinal process.      [JS]   1327 Nitrite, UA: Negative [JS]   1327 WBC, UA: 0-5 [JS]      ED Course User Index  [JS] Kenidee Cregan, Carlisle Beers, MD             Procedures    Clinical Impression & Disposition     Clinical Impression  Final diagnoses:   Acute dehydration   Diarrhea, unspecified type   Near syncope        ED Disposition       ED Disposition   Discharge from ED Observation    Condition   --    Date/Time   Tue Mar 14, 2023  3:16 PM    Comment   --                Discharge Medication List as of 03/14/2023  3:17 PM        START taking these medications    Details   ondansetron (ZOFRAN-ODT) 4 MG disintegrating tablet Take 1 tablet (4 mg) by mouth every 6 (six) hours as needed for Nausea, Starting Tue 03/14/2023, [facility]                       Kinzly Pierrelouis, Carlisle Beers, MD  03/14/23 [phone]

## 2023-10-17 NOTE — Telephone Encounter (Signed)
 Pt would like to resch'd her Mammo Diagnostic. Need new orders

## 2023-10-19 NOTE — Progress Notes (Signed)
 [facility]- Breast Surgery    High Risk Follow Up     CC: High risk screening     Subjective:   Monica Turner is a 63 y.o. woman who presents for high risk screening. 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 She had an excisional biopsy of the right breast in Aug 2022. She reports a recent history of right breast pain, but otherwise no mass, nipple discharge, skin changes, pain, or lymphadenopathy. She denies any changes in her health or new medications since her last visit. There are no changes in her family history relevant to cancer. She has no significant family history of breast or other cancer.     She continues to have hot flashes after menopause. Due to severe hot flashes, she elected not to start on chemoprevention.     She has a breast MRI on 11/10/2022, BIRADS 2. Her most recent mammogram was today, 10/19/2023, BIRADS 2.    Patient's medications, allergies, past medical, surgical, social and family histories were reviewed and updated as appropriate.    Objective:     Physical Exam  Constitutional:       General: She is not in acute distress.     Appearance: She is not diaphoretic.   HENT:      Head: Normocephalic and atraumatic.      Mouth/Throat:      Pharynx: No oropharyngeal exudate.   Eyes:      General: No scleral icterus.        Right eye: No discharge.         Left eye: No discharge.   Neck:      Vascular: No JVD.      Trachea: No tracheal deviation.   Cardiovascular:      Rate and Rhythm: Normal rate and regular rhythm.      Pulses: Normal pulses.   Pulmonary:      Effort: Pulmonary effort is normal. No respiratory distress.   Chest:   Breasts:     Right: No swelling, bleeding, inverted nipple, mass, nipple discharge, skin change or tenderness.      Left: No swelling, bleeding, inverted nipple, mass, nipple discharge, skin change or tenderness.       Abdominal:      General: There is no distension.      Palpations: Abdomen is soft.      Tenderness: There is no abdominal tenderness. Lymphadenopathy:      Cervical: No cervical adenopathy.      Upper Body:      Right upper body: No supraclavicular or axillary adenopathy.      Left upper body: No supraclavicular or axillary adenopathy.   Skin:     General: Skin is warm and dry.      Findings: No erythema or rash.   Neurological:      Mental Status: She is alert and oriented to person, place, and time.         IMAGING  Breast MRI 11/10/2022   The breast(s) have scattered fibroglandular tissue.  There is symmetric mild background parenchymal enhancement.  There is a postsurgical scar seen in the middle third upper  outer of the right breast.  There is no evidence of any mass or suspicious enhancements in  the left breast.     BI-RADS Assessment Category 2:  Benign finding.  Postsurgical scar in the right breast is benign.  A follow up MRI is recommended in 1 year as well as annual  mammography.  Bilateral diagnostic mammogram 10/19/23    BREAST COMPOSITION:  There are scattered areas of fibroglandular density.     FINDINGS:  MAMMO DIAGNOSTIC W OR WO CAD WITH TOMOSYNTHESIS - BI  Right  1) Post-Surgical Finding: There is a stable postsurgical scar seen in the right breast at 10 o'clock in the middle depth, 6 cm from the nipple from excision demonstrating ADH.      No mammographic abnormality to correspond with the patient's focal pain in the right upper outer quadrant.     Left  There is no evidence of suspicious masses, calcifications, or other abnormal findings in the left breast.     MAMMO US  - BREAST LIMITED RT  Right  1) Post-Surgical Finding: There is a postsurgical scar seen in the right breast at 10 o'clock, 6 cm from the nipple. No suspicious sonographic finding to correspond with patient's pain in the right breast.      Patient endorses three months of intermittent, shooting pain in the upper outer quadrant of the right breast.      IMPRESSION:  BI-RADS Assessment Category: Overall: 2 - Benign  Recommendation: Screening Mammogram in 1 year is recommended for both breasts.     Right  1: Right breast post-surgical finding at the middle depth and 10 o'clock. Assessment: 2 - Benign finding.      Left  MAMMO DIAGNOSTIC W OR WO CAD WITH TOMOSYNTHESIS - BI  There is no mammographic evidence of malignancy in the left breast.       Assessment/Plan:     Ms. 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 At her previous appointment, high risk surveillance as well as chemoprevention were discussed. She was not started on chemoprevention due to severe menopause symptoms.  These symptoms persist.     She remains interested in high risk surveillance. Options were reviewed which includes mammogram + MRI annually versus CESM annually. She would prefer to continue with mammogram + MRI. Recommendation for follow-up in 12 months for continued surveillance.     PLAN:  Return to clinic in 1 year with Hart Harts.  Breast MRI in 04/2024   Screening mammogram in 10/2024  Please call or contact the office with any concerns or questions prior to the next visit.    Sherryle Holt, MD  General Surgery Resident  Breast Oncology Service [phone]    I have personally interviewed and examined the patient.  I have reviewed the provider?s history, physical exam, assessment and management plans.  I concur with or have edited all elements of the provider?s note. I have reviewed results from breast imaging today.    Physician assessment of risk:  Low Risk         Cleora Danish, MD     Cleora Danish, MD, MPH, FACS  Professor of Surgery

## 2024-07-31 ENCOUNTER — Emergency Department
Admission: EM | Admit: 2024-07-31 | Discharge: 2024-08-01 | Disposition: A | Discharge status: Home / Self Care | Attending: Emergency Medicine | Admitting: Emergency Medicine

## 2024-07-31 DIAGNOSIS — R519 Headache, unspecified: Secondary | ICD-10-CM | POA: Insufficient documentation

## 2024-07-31 NOTE — ED Triage Notes (Signed)
 Patient states she was upset today and was crying all day, then started with a headache pressure in her sinus and like spams in the side of her face states she then rubbed Biofreeze on her neck and felt it was absorbed into the roof of her mouth.  Took klonopin  1mg  but is still very anxious. And nauseated

## 2024-08-01 ENCOUNTER — Emergency Department

## 2024-08-01 LAB — ECG 12-LEAD
Atrial Rate: 68 {beats}/min
IHS MUSE NARRATIVE AND IMPRESSION: NORMAL
P Axis: 57 degrees
P-R Interval: 166 ms
Q-T Interval: 416 ms
QRS Duration: 78 ms
QTC Calculation (Bezet): 442 ms
R Axis: 27 degrees
T Axis: 55 degrees
Ventricular Rate: 68 {beats}/min

## 2024-08-01 LAB — COVID-19 (SARS-COV-2) & INFLUENZA  A/B, NAA (ROCHE LIAT)
Influenza A RNA: NOT DETECTED
Influenza B RNA: NOT DETECTED
SARS-CoV-2 (COVID-19) RNA: NOT DETECTED

## 2024-08-01 MED ORDER — MAGNESIUM SULFATE IN D5W 1-5 GM/100ML-% IV SOLN
1.0000 g | Freq: Once | INTRAVENOUS | Status: AC
Start: 2024-08-01 — End: 2024-08-01
  Administered 2024-08-01: 1 g via INTRAVENOUS
  Filled 2024-08-01: qty 100

## 2024-08-01 MED ORDER — DIPHENHYDRAMINE HCL 50 MG/ML IJ SOLN
25.0000 mg | Freq: Once | INTRAMUSCULAR | Status: AC
Start: 2024-08-01 — End: 2024-08-01
  Administered 2024-08-01: 25 mg via INTRAVENOUS
  Filled 2024-08-01: qty 1

## 2024-08-01 MED ORDER — DIAZEPAM 5 MG/ML IJ SOLN
2.5000 mg | Freq: Once | INTRAMUSCULAR | Status: AC
Start: 2024-08-01 — End: 2024-08-01
  Administered 2024-08-01: 2.5 mg via INTRAVENOUS
  Filled 2024-08-01: qty 2

## 2024-08-01 MED ORDER — ACETAMINOPHEN 500 MG PO TABS
1000.0000 mg | ORAL_TABLET | Freq: Once | ORAL | Status: AC
Start: 2024-08-01 — End: 2024-08-01
  Administered 2024-08-01: 1000 mg via ORAL
  Filled 2024-08-01: qty 2

## 2024-08-01 MED ORDER — METOCLOPRAMIDE HCL 5 MG/ML IJ SOLN
5.0000 mg | Freq: Once | INTRAMUSCULAR | Status: AC
Start: 2024-08-01 — End: 2024-08-01
  Administered 2024-08-01: 5 mg via INTRAVENOUS
  Filled 2024-08-01: qty 2

## 2024-08-01 MED ORDER — SODIUM CHLORIDE 0.9 % IV BOLUS
1000.0000 mL | Freq: Once | INTRAVENOUS | Status: AC
Start: 2024-08-01 — End: 2024-08-01
  Administered 2024-08-01: 1000 mL via INTRAVENOUS
  Filled 2024-08-01: qty 1000

## 2024-08-01 MED ORDER — BUTALBITAL-APAP-CAFFEINE 50-325-40 MG PO TABS
1.0000 | ORAL_TABLET | ORAL | 0 refills | Status: AC | PRN
Start: 2024-08-01 — End: ?

## 2024-08-01 MED ORDER — KETOROLAC TROMETHAMINE 30 MG/ML IJ SOLN
15.0000 mg | Freq: Once | INTRAMUSCULAR | Status: AC
Start: 2024-08-01 — End: 2024-08-01
  Administered 2024-08-01: 15 mg via INTRAVENOUS
  Filled 2024-08-01: qty 1

## 2024-08-01 MED ORDER — METOCLOPRAMIDE HCL 5 MG/ML IJ SOLN
5.0000 mg | Freq: Once | INTRAMUSCULAR | Status: DC
Start: 2024-08-01 — End: 2024-08-01

## 2024-08-01 NOTE — ED Provider Notes (Addendum)
 EMERGENCY DEPARTMENT HISTORY AND PHYSICAL EXAM    Date Time: 08/01/24 3:16 AM  Patient Name: Monica Turner, 63 y.o., female  ED Provider: C. Cynda Soule, MD.    History of Presenting Illness:     Chief Complaint: headache  History obtained from: Patient.  Narrative/Additional Historical Findings:Monica Turner is a 63 y.o. female  presents with headache  History of Present Illness  Monica Turner is a 63 year old female who presents with severe headache and jaw pain following emotional distress.    She experiences severe headache and jaw pain that began immediately after crying due to a family-related emotional upset earlier today. The headache started around 7:00 to 7:30 PM and has progressively worsened. She describes the pain as unbearable, stating it feels like she is losing her mind. She took diclofenac around 7:00 to 7:30 PM and 1 mg of Klonopin  between 6:00 and 6:30 PM to manage her anxiety and pain, but there was no relief from these medications.    In addition to the headache, she experiences severe jaw pain, describing it as feeling like her jaws are freezing or on fire. The pain extends over the bones at the top of her throat and across her teeth. She is unsure if she was grinding her teeth during the emotional upset. She mentions a history of a headache following crying a few weeks ago, but notes that this current episode is more severe. No history of chronic headaches or previous consultations with a neurologist for headaches.    She is currently taking an antibiotic, which was prescribed after she reported throat and sinus irritation, though her doctor was unsure if it was truly a sinus infection. She is unsure of the antibiotic's name but recalls it was prescribed on Monday.    No recent fever, chest pain, or breathing difficulties. She reports profuse sweating and stomach acid issues.    Nursing notes from this date of service were reviewed.    Past Medical History:   Medical  History[1]    Past Surgical History:   Past Surgical History[2]    Family History:   Family History[3]    Social History:   Social History[4]    Allergies:   Allergies[5]    Medications:   Current Medications[6]    Review of Systems:   Review of Systems   Per hpi    Physical Exam:     ED Triage Vitals [07/31/24 2337]   Encounter Vitals Group      BP 122/86      Girls Systolic BP Percentile       Girls Diastolic BP Percentile       Boys Systolic BP Percentile       Boys Diastolic BP Percentile       Heart Rate 80      Resp Rate 20      Temp 97.8 F (36.6 C)      Temp src Oral      SpO2 100 %      Weight 69.2 kg      Height 1.651 m      Head Circumference       Peak Flow       Pain Score 10      Pain Loc       Pain Education       Exclude from Growth Chart      Physical Exam   Constitutional: Oriented to person, place, and time. Appears well-developed. No distress.  Head: Normocephalic and atraumatic.   HEENT:  TMs clear bl, oropharynx clear, ttp over masseter muscles bilaterally, no tenderness of TMJ or temporal vasculature.  Eyes: Conjunctivae and EOM are normal.   Neck: Normal range of motion. Neck supple.   Cardiovascular: Normal rate, regular rhythm and normal heart sounds.    Pulmonary/Chest: CTAB, Effort normal.   Abdominal: Soft. No distension.   Musculoskeletal: Normal range of motion. No edema or tenderness.   Neurological: Alert and oriented to person, place, and time. GCS 15, No facial droop or slurred speech, tongue midline, PERRL/EOMI, 5/5 strength UE/LE BL,gait normal, No focal deficits  Skin: Skin is warm and dry. Not diaphoretic.   Psychiatric: Normal mood and affect.   Nursing note and vitals reviewed.  Physical Exam  HEENT: Normal oropharynx, no abnormalities in the oral cavity or throat.    Labs:     Labs Reviewed   COVID-19 (SARS-COV-2) & INFLUENZA  A/B, NAA (ROCHE LIAT) - Normal    Narrative:     A result of Detected indicates POSITIVE for the presence of viral RNA                  A result  of Not Detected indicates NEGATIVE for the presence of viral RNA                                    Test performed using the Roche cobas Liat SARS-CoV-2 & Influenza A/B assay. This is a multiplex real-time RT-PCR assay for the detection of SARS-CoV-2, influenza A, and influenza B virus RNA.                                     Influenza A and influenza B negative results should be considered presumptive in samples that have a positive SARS-CoV-2 result.  If positive for SARS-CoV-2 and co-infection with influenza A or influenza B virus is suspected and would change clinical management, please order influenza specific testing.                                     Viral nucleic acids may persist in vivo, independent of viability. Detection of viral nucleic acid does not imply the presence of infectious virus, or that virus nucleic acid is the cause of clinical symptoms. Negative results do not preclude SARS-CoV-2, influenza A, and/or influenza B infection and should not be used as the sole basis for diagnosis, treatment or other patient management decisions. Invalid results may be due to inhibiting substances in the specimen and recollection should occur.         Rads:   CT Head WO Contrast  Result Date: 08/01/2024   No evidence of acute intracranial abnormality. Toribio HERO. Fistere, MD 08/01/2024 1:03 AM      Medications   metoclopramide (REGLAN) injection 5 mg (5 mg Intravenous Given 08/01/24 0013)   diphenhydrAMINE  (BENADRYL ) injection 25 mg (25 mg Intravenous Given 08/01/24 0013)   acetaminophen  (TYLENOL ) tablet 1,000 mg (1,000 mg Oral Given 08/01/24 0016)   sodium chloride  0.9 % bolus 1,000 mL (0 mLs Intravenous Stopped 08/01/24 0118)   ketorolac  (TORADOL ) injection 15 mg (15 mg Intravenous Given 08/01/24 0145)   magnesium  sulfate 1g in dextrose 5% 100mL IVPB (premix) (0 g Intravenous  Stopped 08/01/24 0309)   diazePAM  (VALIUM ) injection 2.5 mg (2.5 mg Intravenous Given 08/01/24 0207)      Results  DIAGNOSTIC  Echocardiogram: Trace pericardial effusion    MDM and ED Course   C. Smayan Hackbart, M.D., is the primary attending for this patient and has obtained and performed the history, PE, and medical decision making for this patient.    Oxygen saturation by pulse oximetry is 95%-100%, Normal.  Interventions: None Needed    ED Course as of 08/01/24 0316   Thu Aug 01, 2024   0155 Patient reports mild improvement in her headache but ongoing tension in her jaw and a of jittery and slow feeling since getting the Reglan and the Benadryl .  Will give Toradol  for ongoing headaches and Valium  and magnesium  for anxiety, possible dystonic reaction pump, jaw spasm.  She is able to speak and open her jaw without issues. [CV]   [id number] Patient reports significant improvement in headache.  She feels well for discharge.  We discussed home care with brain rest avoiding headache triggers outpatient medication management and neurology referral. [CV]      ED Course User Index  [CV] Antoinett Dorman, Dorothyann HERO, MD         MDM:    Number and Complexity of Problems Addressed (select at least one)    Complexity: High: 1 acute or chronic illness or injury that poses a threat to life or bodily function      Presenting acute/chronic problems: headache    Differential diagnosis to include but not limited to: anxiety, emotional stress reaction,Tension ha, sinus headache/sinusitis, migraine, nsaid withdrawal ha, opiate withdrawal headache, subarachnoid hemorrhage, intracerebral hemorrhage, epidural hematoma, subdural hematoma, venous sinus thrombosis, meningitis, concussion      Chronic illness impacting care (obesity, diabetes, hypertension, elderly - state impact): n/a    ______________________________________________________________________  Amount/complexity of Data Reviewed   {Tip - Level 4 = (1/3 of the following categories); Level 5 = (2/3 categories).   This message will disappear upon signing. :  L\  Look below for  information    History obtained from another historian (parent, spouse,  care giver, ems) : n/a    Review of older external records from n/a and found this relevant information: n/a       Independent interpretation of  EKG:     EKG Interpretation  EKG interpreted independently by me:    Rate: Normal  Rhythm: sinus rhythm  Axis: Normal  ST-T Segments: nonspec st-t changes  Conduction: No blocks  Impression: Non-specific EKG    No significant change when compared from previous EKG on  Jun 14 Mar 2023.    Independent visualized and interpretation of radiological study by me:     Type of xray performed : ct head  Independent Interpretation by me: no acute bleed    Diagnostic tests appropriately considered even if not ultimately performed: n/a    Discussion of test interpretation with external physician/provider :  n/a    Discussion of management with other providers:   Consulted with  n/a. Patient condition and all pertinent labs and/or radiology studies discussed with physician.     ______________________________________________________________________    Risk of Complications and/or Morbidity or Mortality of Patient Management  (select one if applicable)    Risk: Prescription drug management , High Risk, and Parenteral controlled substances      Patient feels much improved with ED care.  She is neurologically intact.  At this time I do not think her headache is  coming from a urgent or emergent cause.  She is afebrile, she  does not have meningismus, her neuroexam is normal and her gait is intact she has been ambulatory through the ER, her clinical features of her headache are not significantly concerning for temporal arteritis subarachnoid hemorrhage or central venous sinus thrombosis, her head CT is negative, she has improved as expected with ED care. I discussed home care with conservative to headache management outpatient follow-up.      Assessment/Plan:   Results and instructions reviewed at the bedside with  patient and family.  Medical Decision Making  A 63 year old female presented with acute severe headache and jaw/facial pain that began immediately after an episode of emotional distress and crying, with associated teeth pain and discomfort over the throat. She has a history of mild pericardial effusion but no chronic medical conditions or immunosuppression. She is currently on antibiotics for possible sinusitis and has no fever, chest pain, or respiratory symptoms. Examination revealed significant tenderness over the masseter muscle and jaw.    Differential diagnosis includes, but is not limited to:  - Tension-type headache with jaw/facial pain: Acute severe headache and jaw pain following emotional distress is most consistent with a tension-type headache, possibly exacerbated by jaw clenching or grinding; no evidence of other acute neurological or infectious etiologies was discussed.  - Acute sinusitis: Sinus and throat irritation with recent antibiotic initiation raises the possibility of acute sinusitis, though the diagnosis was not confirmed and was treated empirically to facilitate travel.      Clinical Impression  Final diagnoses:   Acute nonintractable headache, unspecified headache type       Disposition  ED Disposition       ED Disposition   Discharge    Condition   Stable    Date/Time   Thu Aug 01, 2024  3:13 AM    Comment   Hermione Havlicek Couchman discharge to home/self care.                   Prescriptions  New Prescriptions    BUTALBITAL-ACETAMINOPHEN -CAFFEINE (FIORICET) 50-325-40 MG PER TABLET    Take 1 tablet by mouth every 4 (four) hours as needed for Headaches           Signed by: C. Messiyah Waterson, MD       Mitzie Marlar, Dorothyann HERO, MD  08/01/24 [phone]         [1]   Past Medical History:  Diagnosis Date    Anxiety     Arthritis     Neck Right Hand    Asthma without status asthmaticus     Claustrophobia     Depression     Gastroesophageal reflux disease     Headache(784.0)     Sinus    Irritable bowel  syndrome     Low back pain     MTHFR (methylene THF reductase) deficiency and homocystinuria     Other plastic surgery for unacceptable cosmetic appearance     Sciatica of left side     Seasonal allergies     Sleep apnea     Smokes with greater than 40 pack year history     Quit 2 weks ago.    Torsion of appendix epiploicae (CMS/HCC)    [2]   Past Surgical History:  Procedure Laterality Date    ABDOMINOPLASTY, LIPOSUCTION (COSMETIC)  2009    CYSTOSCOPY, URETEROSCOPY  2015    EGD, COLONOSCOPY  2015    FACELIFT, (RHYTIDECTOMY) (COSMETIC)  02/06/2014  Procedure: FACELIFT, (RHYTIDECTOMY) (COSMETIC);  Surgeon: Job Lorrene Dalton, MD;  Location: [facility];  Service: Plastics;  Laterality: Bilateral;  RHYTIDECTOMY (COSMETIC)     HYSTEROSCOPY, ENDOMETRIAL ABLATION, THERMACHOICE  2015    LIPOSUCTION, NECK (COSMETIC)  2009   [3]   Family History  Problem Relation Name Age of Onset    Heart disease Mother      Heart disease Father      Cancer Maternal Grandfather      Anesthesia problems Sister     [4]   Social History  Socioeconomic History    Marital status: Married   Tobacco Use    Smoking status: Former     Current packs/day: 0.00     Average packs/day: 0.5 packs/day for 40.0 years (20.0 ttl pk-yrs)     Types: Cigarettes     Start date: 05/1982     Quit date: 05/2022     Years since quitting: 2.2    Smokeless tobacco: Never   Vaping Use    Vaping status: Every Day   Substance and Sexual Activity    Alcohol use: Not Currently    Drug use: No    Sexual activity: Not Currently     Social Drivers of Health     Food Insecurity: No Food Insecurity (03/14/2023)    Hunger Vital Sign     Worried About Running Out of Food in the Last Year: Never true     Ran Out of Food in the Last Year: Never true   Transportation Needs: No Transportation Needs (03/14/2023)    PRAPARE - Therapist, art (Medical): No     Lack of Transportation (Non-Medical): No   Intimate Partner Violence: Not At Risk (03/14/2023)     Humiliation, Afraid, Rape, and Kick questionnaire     Fear of Current or Ex-Partner: No     Emotionally Abused: No     Physically Abused: No     Sexually Abused: No   Housing Stability: Low Risk (03/14/2023)    Housing Stability Vital Sign     Unable to Pay for Housing in the Last Year: No     Number of Places Lived in the Last Year: 1     Unstable Housing in the Last Year: No   [5] No Known Allergies  [6] No current facility-administered medications for this encounter.    Current Outpatient Medications:     cefdinir (OMNICEF) 300 MG capsule, 1 capsule (300 mg), Disp: , Rfl:     Cholecalciferol (VITAMIN D) 400 units/mL liquid (NICU), Take by mouth daily, Disp: , Rfl:     clonazePAM  (KlonoPIN ) 0.5 MG tablet, , Disp: , Rfl:     DULoxetine  (CYMBALTA ) 30 MG capsule, Take 1 capsule (30 mg) by mouth once daily, Disp: , Rfl:     DULoxetine  (CYMBALTA ) 60 MG capsule, Take 1 capsule (60 mg) by mouth once daily, Disp: , Rfl:     fluticasone (FLONASE) 50 MCG/ACT nasal spray, , Disp: , Rfl:     Mounjaro 10 MG/0.5ML Solution Pen-injector, , Disp: , Rfl:     Probiotic Product (PROBIOTIC DAILY PO), Take 2 tablets by mouth daily., Disp: , Rfl:     vitamin B-12 (CYANOCOBALAMIN) 250 MCG tablet, Take 1 tablet (250 mcg) by mouth once daily, Disp: , Rfl:     butalbital-acetaminophen -caffeine (FIORICET) 50-325-40 MG per tablet, Take 1 tablet by mouth every 4 (four) hours as needed for Headaches, Disp: 15 tablet, Rfl: 0  esomeprazole  (NEXIUM ) 40 MG capsule, Take 1 capsule (40 mg) by mouth every morning before breakfast, Disp: , Rfl:     Magnesium  300 MG Cap, Take by mouth, Disp: , Rfl:     ondansetron  (ZOFRAN -ODT) 4 MG disintegrating tablet, Take 1 tablet (4 mg) by mouth every 6 (six) hours as needed for Nausea, Disp: 8 tablet, Rfl: 0       Timo Hartwig, Dorothyann HERO, MD  08/01/24 [phone]

## 2024-08-01 NOTE — ED Notes (Signed)
 Complaints of tremors, dry mouth.  Informed Dr. Visintainer, with orders placed.

## 2024-08-01 NOTE — ED Provider Notes (Incomplete)
 EMERGENCY DEPARTMENT HISTORY AND PHYSICAL EXAM    Date Time: 08/01/24 12:00 AM  Patient Name: Monica Turner, 63 y.o., female  ED Provider: C. Teagan Ozawa, MD.    History of Presenting Illness:     Chief Complaint: headache  History obtained from: Patient.  Narrative/Additional Historical Findings:Monica Turner is a 63 y.o. female  presents with headache  History of Present Illness  Monica Turner is a 62 year old female who presents with severe headache and jaw pain following emotional distress.    She experiences severe headache and jaw pain that began immediately after crying due to a family-related emotional upset earlier today. The headache started around 7:00 to 7:30 PM and has progressively worsened. She describes the pain as unbearable, stating it feels like she is losing her mind. She took diclofenac around 7:00 to 7:30 PM and 1 mg of Klonopin  between 6:00 and 6:30 PM to manage her anxiety and pain, but there was no relief from these medications.    In addition to the headache, she experiences severe jaw pain, describing it as feeling like her jaws are freezing or on fire. The pain extends over the bones at the top of her throat and across her teeth. She is unsure if she was grinding her teeth during the emotional upset. She mentions a history of a headache following crying a few weeks ago, but notes that this current episode is more severe. No history of chronic headaches or previous consultations with a neurologist for headaches.    She is currently taking an antibiotic, which was prescribed after she reported throat and sinus irritation, though her doctor was unsure if it was truly a sinus infection. She is unsure of the antibiotic's name but recalls it was prescribed on Monday.    No recent fever, chest pain, or breathing difficulties. She reports profuse sweating and stomach acid issues.    Nursing notes from this date of service were reviewed.    Past Medical History:   Medical  History[1]    Past Surgical History:   Past Surgical History[2]    Family History:   Family History[3]    Social History:   Social History[4]    Allergies:   Allergies[5]    Medications:   Current Medications[6]    Review of Systems:   Review of Systems   Per hpi    Physical Exam:     ED Triage Vitals [07/31/24 2337]   Encounter Vitals Group      BP 122/86      Girls Systolic BP Percentile       Girls Diastolic BP Percentile       Boys Systolic BP Percentile       Boys Diastolic BP Percentile       Heart Rate 80      Resp Rate 20      Temp 97.8 F (36.6 C)      Temp src Oral      SpO2 100 %      Weight 69.2 kg      Height 1.651 m      Head Circumference       Peak Flow       Pain Score 10      Pain Loc       Pain Education       Exclude from Growth Chart      Physical Exam   Constitutional: Oriented to person, place, and time. Appears well-developed. No distress.  Head: Normocephalic and atraumatic.   HEENT:  TMs clear bl, oropharynx clear, ttp over masseter muscles bilaterally  Eyes: Conjunctivae and EOM are normal.   Neck: Normal range of motion. Neck supple.   Cardiovascular: Normal rate, regular rhythm and normal heart sounds.    Pulmonary/Chest: CTAB, Effort normal.   Abdominal: Soft. No distension.   Musculoskeletal: Normal range of motion. No edema or tenderness.   Neurological: Alert and oriented to person, place, and time. GCS 15, No facial droop or slurred speech, tongue midline, PERRL/EOMI, 5/5 strength UE/LE BL,gait normal, No focal deficits  Skin: Skin is warm and dry. Not diaphoretic.   Psychiatric: Normal mood and affect.   Nursing note and vitals reviewed.  Physical Exam  HEENT: Normal oropharynx, no abnormalities in the oral cavity or throat.    Labs:     Labs Reviewed   COVID-19 (SARS-COV-2) & INFLUENZA  A/B, NAA ([facility])         Rads:   No results found.    Medications - No data to display  Results  DIAGNOSTIC  Echocardiogram: Trace pericardial effusion    MDM and ED Course   C. Vaishnav Demartin,  M.D., is the primary attending for this patient and has obtained and performed the history, PE, and medical decision making for this patient.    Oxygen saturation by pulse oximetry is {ED SaO2 Interpretation:24017::95%-100%, Normal}.  Interventions: {ED SaO2 Interventions:24018}           MDM:    Number and Complexity of Problems Addressed (select at least one)    {Complexity:58839}    Presenting acute/chronic problems: ***    Differential diagnosis to include but not limited to: ***    Chronic illness impacting care (obesity, diabetes, hypertension, elderly - state impact): ***    ______________________________________________________________________  Amount/complexity of Data Reviewed   {Tip - Level 4 = (1/3 of the following categories); Level 5 = (2/3 categories).   This message will disappear upon signing. :  L\  Look below for information    History obtained from another historian (parent, spouse,  care giver, ems) : ***    Review of older external records from *** and found this relevant information: ***       Independent interpretation of  EKG:     EKG Interpretation  EKG interpreted independently by me:    Rate: {Rate:16023334}  Rhythm: {FINDINGS; EKG;RHYTHM:22075}  Axis: {Axis:16023366}  ST-T Segments: {st-t segments:38844}  Conduction: {Conduction:16023335}  Impression: {Clinical impression:16023370}    No significant change when compared from previous EKG on ***.    Independent visualized and interpretation of radiological study by me:     Type of xray performed : ***  Independent Interpretation by me: ***    Diagnostic tests appropriately considered even if not ultimately performed: ***    Discussion of test interpretation with external physician/provider :  ***    Discussion of management with other providers:   Consulted with  ***. Patient condition and all pertinent labs and/or radiology studies discussed with physician.      ______________________________________________________________________    Risk of Complications and/or Morbidity or Mortality of Patient Management  (select one if applicable)    {Mpdx:41148}      Assessment/Plan:   Results and instructions reviewed at the bedside with patient and family.  Medical Decision Making  A 63 year old female presented with acute severe headache and jaw/facial pain that began immediately after an episode of emotional distress and crying, with associated teeth pain and discomfort  over the throat. She has a history of mild pericardial effusion but no chronic medical conditions or immunosuppression. She is currently on antibiotics for possible sinusitis and has no fever, chest pain, or respiratory symptoms. Examination revealed significant tenderness over the masseter muscle and jaw.    Differential diagnosis includes, but is not limited to:  - Tension-type headache with jaw/facial pain: Acute severe headache and jaw pain following emotional distress is most consistent with a tension-type headache, possibly exacerbated by jaw clenching or grinding; no evidence of other acute neurological or infectious etiologies was discussed.  - Acute sinusitis: Sinus and throat irritation with recent antibiotic initiation raises the possibility of acute sinusitis, though the diagnosis was not confirmed and was treated empirically to facilitate travel.    Acute severe headache with jaw and facial pain  - Administered medication for headache relief    Acute sinusitis  - Continue current antibiotic course    Clinical Impression  Final diagnoses:   None       Disposition  ED Disposition       None            Prescriptions  New Prescriptions    No medications on file           Signed by: C. Denika Krone, MD             [1]  Past Medical History:  Diagnosis Date   . Anxiety    . Arthritis     Neck Right Hand   . Asthma without status asthmaticus    . Claustrophobia    . Depression    . Gastroesophageal reflux  disease    . Headache(784.0)     Sinus   . Irritable bowel syndrome    . Low back pain    . MTHFR (methylene THF reductase) deficiency and homocystinuria    . Other plastic surgery for unacceptable cosmetic appearance    . Sciatica of left side    . Seasonal allergies    . Sleep apnea    . Smokes with greater than 40 pack year history     Quit 2 weks ago.   . Torsion of appendix epiploicae (CMS/HCC)    [2]  Past Surgical History:  Procedure Laterality Date   . ABDOMINOPLASTY, LIPOSUCTION (COSMETIC)  2009   . CYSTOSCOPY, URETEROSCOPY  2015   . EGD, COLONOSCOPY  2015   . FACELIFT, (RHYTIDECTOMY) (COSMETIC)  02/06/2014    Procedure: FACELIFT, (RHYTIDECTOMY) (COSMETIC);  Surgeon: Job Lorrene Dalton, MD;  Location: [facility] OR;  Service: Plastics;  Laterality: Bilateral;  RHYTIDECTOMY (COSMETIC)    . HYSTEROSCOPY, ENDOMETRIAL ABLATION, THERMACHOICE  2015   . LIPOSUCTION, NECK (COSMETIC)  2009   [3]  Family History  Problem Relation Name Age of Onset   . Heart disease Mother     . Heart disease Father     . Cancer Maternal Grandfather     . Anesthesia problems Sister     [4]  Social History  Socioeconomic History   . Marital status: Married   Tobacco Use   . Smoking status: Former     Current packs/day: 0.00     Average packs/day: 0.5 packs/day for 40.0 years (20.0 ttl pk-yrs)     Types: Cigarettes     Start date: 05/1982     Quit date: 05/2022     Years since quitting: 2.2   . Smokeless tobacco: Never   Vaping Use   . Vaping status: Every Day  Substance and Sexual Activity   . Alcohol use: Not Currently   . Drug use: No   . Sexual activity: Not Currently     Social Drivers of Health     Food Insecurity: No Food Insecurity (03/14/2023)    Hunger Vital Sign    . Worried About Programme researcher, broadcasting/film/video in the Last Year: Never true    . Ran Out of Food in the Last Year: Never true   Transportation Needs: No Transportation Needs (03/14/2023)    PRAPARE - Transportation    . Lack of Transportation (Medical): No    . Lack of  Transportation (Non-Medical): No   Intimate Partner Violence: Not At Risk (03/14/2023)    Humiliation, Afraid, Rape, and Kick questionnaire    . Fear of Current or Ex-Partner: No    . Emotionally Abused: No    . Physically Abused: No    . Sexually Abused: No   Housing Stability: Low Risk (03/14/2023)    Housing Stability Vital Sign    . Unable to Pay for Housing in the Last Year: No    . Number of Places Lived in the Last Year: 1    . Unstable Housing in the Last Year: No   [5]  No Known Allergies  [6]  No current facility-administered medications for this encounter.    Current Outpatient Medications:   .  Cholecalciferol (VITAMIN D) 400 units/mL liquid (NICU), Take by mouth daily, Disp: , Rfl:   .  clonazePAM  (KlonoPIN ) 0.5 MG tablet, , Disp: , Rfl:   .  DULoxetine  (CYMBALTA ) 30 MG capsule, Take 1 capsule (30 mg) by mouth once daily, Disp: , Rfl:   .  DULoxetine  (CYMBALTA ) 60 MG capsule, Take 1 capsule (60 mg) by mouth once daily, Disp: , Rfl:   .  fluticasone (FLONASE) 50 MCG/ACT nasal spray, , Disp: , Rfl:   .  Mounjaro 10 MG/0.5ML Solution Pen-injector, , Disp: , Rfl:   .  Probiotic Product (PROBIOTIC DAILY PO), Take 2 tablets by mouth daily., Disp: , Rfl:   .  vitamin B-12 (CYANOCOBALAMIN) 250 MCG tablet, Take 1 tablet (250 mcg) by mouth once daily, Disp: , Rfl:   .  esomeprazole  (NEXIUM ) 40 MG capsule, Take 1 capsule (40 mg) by mouth every morning before breakfast, Disp: , Rfl:   .  Magnesium  300 MG Cap, Take by mouth, Disp: , Rfl:   .  ondansetron  (ZOFRAN -ODT) 4 MG disintegrating tablet, Take 1 tablet (4 mg) by mouth every 6 (six) hours as needed for Nausea, Disp: 8 tablet, Rfl: 0
